# Patient Record
Sex: Male | Born: 1960 | Race: Black or African American | Hispanic: No | Smoking: Current every day smoker
Health system: Southern US, Community
[De-identification: ages and names within clinical notes are randomized; demographics above are authoritative.]

## PROBLEM LIST (undated history)

## (undated) DIAGNOSIS — I429 Cardiomyopathy, unspecified: Secondary | ICD-10-CM

## (undated) DIAGNOSIS — F1010 Alcohol abuse, uncomplicated: Secondary | ICD-10-CM

## (undated) DIAGNOSIS — F419 Anxiety disorder, unspecified: Secondary | ICD-10-CM

## (undated) HISTORY — PX: APPENDECTOMY: SHX54

## (undated) HISTORY — PX: HERNIA REPAIR: SHX51

## (undated) HISTORY — DX: Cardiomyopathy, unspecified: I42.9

---

## 2001-02-03 ENCOUNTER — Emergency Department (HOSPITAL_COMMUNITY): Admission: EM | Admit: 2001-02-03 | Discharge: 2001-02-04 | Payer: Self-pay | Admitting: Emergency Medicine

## 2005-10-15 ENCOUNTER — Emergency Department (HOSPITAL_COMMUNITY): Admission: EM | Admit: 2005-10-15 | Discharge: 2005-10-15 | Payer: Self-pay | Admitting: Emergency Medicine

## 2006-08-21 ENCOUNTER — Emergency Department (HOSPITAL_COMMUNITY): Admission: EM | Admit: 2006-08-21 | Discharge: 2006-08-21 | Payer: Self-pay | Admitting: *Deleted

## 2011-01-07 ENCOUNTER — Emergency Department (HOSPITAL_COMMUNITY)
Admission: EM | Admit: 2011-01-07 | Discharge: 2011-01-07 | Disposition: A | Discharge status: Home / Self Care | Payer: BC Managed Care – PPO | Attending: Emergency Medicine | Admitting: Emergency Medicine

## 2011-01-07 DIAGNOSIS — T7840XA Allergy, unspecified, initial encounter: Secondary | ICD-10-CM | POA: Insufficient documentation

## 2011-01-07 DIAGNOSIS — L299 Pruritus, unspecified: Secondary | ICD-10-CM | POA: Insufficient documentation

## 2011-01-07 DIAGNOSIS — R21 Rash and other nonspecific skin eruption: Secondary | ICD-10-CM | POA: Insufficient documentation

## 2011-01-07 DIAGNOSIS — I10 Essential (primary) hypertension: Secondary | ICD-10-CM | POA: Insufficient documentation

## 2013-05-07 ENCOUNTER — Other Ambulatory Visit (INDEPENDENT_AMBULATORY_CARE_PROVIDER_SITE_OTHER): Payer: BC Managed Care – PPO

## 2013-05-07 ENCOUNTER — Ambulatory Visit (INDEPENDENT_AMBULATORY_CARE_PROVIDER_SITE_OTHER): Payer: BC Managed Care – PPO | Admitting: Internal Medicine

## 2013-05-07 ENCOUNTER — Encounter: Payer: Self-pay | Admitting: Internal Medicine

## 2013-05-07 ENCOUNTER — Ambulatory Visit (INDEPENDENT_AMBULATORY_CARE_PROVIDER_SITE_OTHER)
Admission: RE | Admit: 2013-05-07 | Discharge: 2013-05-07 | Disposition: A | Discharge status: Home / Self Care | Payer: BC Managed Care – PPO | Source: Ambulatory Visit | Attending: Internal Medicine | Admitting: Internal Medicine

## 2013-05-07 ENCOUNTER — Other Ambulatory Visit: Payer: Self-pay | Admitting: Internal Medicine

## 2013-05-07 VITALS — BP 122/82 | HR 93 | Temp 98.4°F | Ht 69.0 in | Wt 194.0 lb

## 2013-05-07 DIAGNOSIS — Z0000 Encounter for general adult medical examination without abnormal findings: Secondary | ICD-10-CM

## 2013-05-07 DIAGNOSIS — Z130 Encounter for screening for diseases of the blood and blood-forming organs and certain disorders involving the immune mechanism: Secondary | ICD-10-CM

## 2013-05-07 DIAGNOSIS — E785 Hyperlipidemia, unspecified: Secondary | ICD-10-CM

## 2013-05-07 DIAGNOSIS — Z1329 Encounter for screening for other suspected endocrine disorder: Secondary | ICD-10-CM

## 2013-05-07 DIAGNOSIS — Z125 Encounter for screening for malignant neoplasm of prostate: Secondary | ICD-10-CM

## 2013-05-07 DIAGNOSIS — M25559 Pain in unspecified hip: Secondary | ICD-10-CM

## 2013-05-07 DIAGNOSIS — M25552 Pain in left hip: Secondary | ICD-10-CM

## 2013-05-07 DIAGNOSIS — Z1211 Encounter for screening for malignant neoplasm of colon: Secondary | ICD-10-CM

## 2013-05-07 DIAGNOSIS — M87052 Idiopathic aseptic necrosis of left femur: Secondary | ICD-10-CM

## 2013-05-07 DIAGNOSIS — Z131 Encounter for screening for diabetes mellitus: Secondary | ICD-10-CM

## 2013-05-07 LAB — HEPATIC FUNCTION PANEL
ALT: 24 U/L (ref 0–53)
Albumin: 4.2 g/dL (ref 3.5–5.2)
Bilirubin, Direct: 0.1 mg/dL (ref 0.0–0.3)
Total Protein: 6.9 g/dL (ref 6.0–8.3)

## 2013-05-07 LAB — BASIC METABOLIC PANEL
CO2: 26 mEq/L (ref 19–32)
Chloride: 102 mEq/L (ref 96–112)
Creatinine, Ser: 0.9 mg/dL (ref 0.4–1.5)
Potassium: 4.2 mEq/L (ref 3.5–5.1)
Sodium: 138 mEq/L (ref 135–145)

## 2013-05-07 LAB — LIPID PANEL
Cholesterol: 187 mg/dL (ref 0–200)
HDL: 36.9 mg/dL — ABNORMAL LOW (ref >39.00)
Total CHOL/HDL Ratio: 5
Triglycerides: 254 mg/dL — ABNORMAL HIGH (ref 0.0–149.0)

## 2013-05-07 LAB — TSH: TSH: 1.06 u[IU]/mL (ref 0.35–5.50)

## 2013-05-07 LAB — CBC
MCHC: 33.4 g/dL (ref 30.0–36.0)
RDW: 12.9 % (ref 11.5–14.6)
WBC: 8.5 10*3/uL (ref 4.5–10.5)

## 2013-05-07 LAB — LDL CHOLESTEROL, DIRECT: Direct LDL: 105.1 mg/dL

## 2013-05-07 MED ORDER — OXYCODONE-ACETAMINOPHEN 10-325 MG PO TABS: 1.0000 | ORAL_TABLET | ORAL | Status: DC | PRN

## 2013-05-07 MED ORDER — TRAMADOL HCL 50 MG PO TABS: 50.0000 mg | ORAL_TABLET | Freq: Three times a day (TID) | ORAL | Status: DC | PRN

## 2013-05-07 MED ORDER — METHYLPREDNISOLONE ACETATE 80 MG/ML IJ SUSP
80.0000 mg | Freq: Once | INTRAMUSCULAR | Status: AC
  Administered 2013-05-07: 80 mg via INTRAMUSCULAR

## 2013-05-07 NOTE — Progress Notes (Signed)
HPI  Pt presents to the clinic today to establish care. He does not have a PCP and has not been seen by a doctor is some time. He does have some concerns today about left hip pain. He does here it click every day. It does cause him pain on a daily basis. He does take 6-7 advil at a time to help the pain ease off. He denies any injury to the area. He denies numbness and tigling down the leg theophylline pain is 7/10. It seems to be worse when he stands.  Flu: never Tetanus : within the last 10 years Colonoscopy: never Prostate Screening: never Eye doctor: never Dentist: never  History reviewed. No pertinent past medical history.  No current outpatient prescriptions on file.   No current facility-administered medications for this visit.    No Known Allergies  Family History  Problem Relation Age of Onset  . Colon cancer Brother   . Liver cancer Brother   . Diabetes Paternal Uncle   . Stroke Neg Hx   . Learning disabilities Neg Hx   . Hypertension Neg Hx   . Heart disease Neg Hx     History   Social History  . Marital Status: Married    Spouse Name: N/A    Number of Children: N/A  . Years of Education: N/A   Occupational History  . Furniture    Social History Main Topics  . Smoking status: Current Every Day Smoker -- 0.25 packs/day for 4 years    Types: Cigarettes  . Smokeless tobacco: Never Used  . Alcohol Use: Yes  . Drug Use: No  . Sexually Active: Yes    Birth Control/ Protection: None   Other Topics Concern  . Not on file   Social History Narrative   Regular exercise-no   Caffeine Use-yes    ROS:  Constitutional: Denies fever, malaise, fatigue, headache or abrupt weight changes.  HEENT: Denies eye pain, eye redness, ear pain, ringing in the ears, wax buildup, runny nose, nasal congestion, bloody nose, or sore throat. Respiratory: Denies difficulty breathing, shortness of breath, cough or sputum production.   Cardiovascular: Denies chest pain, chest  tightness, palpitations or swelling in the hands or feet.  Gastrointestinal: Denies abdominal pain, bloating, constipation, diarrhea or blood in the stool.  GU: Denies frequency, urgency, pain with urination, blood in urine, odor or discharge. Musculoskeletal: Pt reports left hip pain. Denies decrease in range of motion, difficulty with gait, muscle pain or joint swelling.  Skin: Denies redness, rashes, lesions or ulcercations.  Neurological: Denies dizziness, difficulty with memory, difficulty with speech or problems with balance and coordination.   No other specific complaints in a complete review of systems (except as listed in HPI above).  PE:  BP 122/82  Pulse 93  Temp(Src) 98.4 F (36.9 C) (Oral)  Ht 5\' 9"  (1.753 m)  Wt 194 lb (87.998 kg)  BMI 28.64 kg/m2  SpO2 97% Wt Readings from Last 3 Encounters:  05/07/13 194 lb (87.998 kg)    General: Appears his stated age, well developed, well nourished in NAD. HEENT: Head: normal shape and size; Eyes: sclera white, no icterus, conjunctiva pink, PERRLA and EOMs intact; Ears: Tm's gray and intact, normal light reflex; Nose: mucosa pink and moist, septum midline; Throat/Mouth: Teeth present, mucosa pink and moist, no lesions or ulcerations noted.  Neck: Normal range of motion. Neck supple, trachea midline. No massses, lumps or thyromegaly present.  Cardiovascular: Normal rate and rhythm. S1,S2 noted.  No  murmur, rubs or gallops noted. No JVD or BLE edema. No carotid bruits noted. Pulmonary/Chest: Normal effort and positive vesicular breath sounds. No respiratory distress. No wheezes, rales or ronchi noted.  Abdomen: Soft and nontender. Normal bowel sounds, no bruits noted. No distention or masses noted. Liver, spleen and kidneys non palpable. Musculoskeletal: Normal range of motion. No signs of joint swelling. No difficulty with gait. Click noted with passive ROM of left hip. Neurological: Alert and oriented. Cranial nerves II-XII intact.  Coordination normal. +DTRs bilaterally. Psychiatric: Mood and affect normal. Behavior is normal. Judgment and thought content normal.      Assessment and Plan:  Preventative Health Maintenance:  Start to work on diet and exercise Encouraged pt to quit smoking, counseling and cessation materials provided, pt not ready to quit. Will set up colonoscopy Will obtain labs today Will screen for prostate cancer

## 2013-05-07 NOTE — Addendum Note (Signed)
Addended by: Brenton Grills C on: 05/07/2013 11:16 AM   Modules accepted: Orders

## 2013-05-07 NOTE — Patient Instructions (Signed)
Health Maintenance, Males A healthy lifestyle and preventative care can promote health and wellness.  Maintain regular health, dental, and eye exams.  Eat a healthy diet. Foods like vegetables, fruits, whole grains, low-fat dairy products, and lean protein foods contain the nutrients you need without too many calories. Decrease your intake of foods high in solid fats, added sugars, and salt. Get information about a proper diet from your caregiver, if necessary.  Regular physical exercise is one of the most important things you can do for your health. Most adults should get at least 150 minutes of moderate-intensity exercise (any activity that increases your heart rate and causes you to sweat) each week. In addition, most adults need muscle-strengthening exercises on 2 or more days a week.   Maintain a healthy weight. The body mass index (BMI) is a screening tool to identify possible weight problems. It provides an estimate of body fat based on height and weight. Your caregiver can help determine your BMI, and can help you achieve or maintain a healthy weight. For adults 20 years and older:  A BMI below 18.5 is considered underweight.  A BMI of 18.5 to 24.9 is normal.  A BMI of 25 to 29.9 is considered overweight.  A BMI of 30 and above is considered obese.  Maintain normal blood lipids and cholesterol by exercising and minimizing your intake of saturated fat. Eat a balanced diet with plenty of fruits and vegetables. Blood tests for lipids and cholesterol should begin at age 20 and be repeated every 5 years. If your lipid or cholesterol levels are high, you are over 50, or you are a high risk for heart disease, you may need your cholesterol levels checked more frequently.Ongoing high lipid and cholesterol levels should be treated with medicines, if diet and exercise are not effective.  If you smoke, find out from your caregiver how to quit. If you do not use tobacco, do not start.  If you  choose to drink alcohol, do not exceed 2 drinks per day. One drink is considered to be 12 ounces (355 mL) of beer, 5 ounces (148 mL) of wine, or 1.5 ounces (44 mL) of liquor.  Avoid use of street drugs. Do not share needles with anyone. Ask for help if you need support or instructions about stopping the use of drugs.  High blood pressure causes heart disease and increases the risk of stroke. Blood pressure should be checked at least every 1 to 2 years. Ongoing high blood pressure should be treated with medicines if weight loss and exercise are not effective.  If you are 45 to 51 years old, ask your caregiver if you should take aspirin to prevent heart disease.  Diabetes screening involves taking a blood sample to check your fasting blood sugar level. This should be done once every 3 years, after age 45, if you are within normal weight and without risk factors for diabetes. Testing should be considered at a younger age or be carried out more frequently if you are overweight and have at least 1 risk factor for diabetes.  Colorectal cancer can be detected and often prevented. Most routine colorectal cancer screening begins at the age of 50 and continues through age 75. However, your caregiver may recommend screening at an earlier age if you have risk factors for colon cancer. On a yearly basis, your caregiver may provide home test kits to check for hidden blood in the stool. Use of a small camera at the end of a tube,   to directly examine the colon (sigmoidoscopy or colonoscopy), can detect the earliest forms of colorectal cancer. Talk to your caregiver about this at age 50, when routine screening begins. Direct examination of the colon should be repeated every 5 to 10 years through age 75, unless early forms of pre-cancerous polyps or small growths are found.  Hepatitis C blood testing is recommended for all people born from 1945 through 1965 and any individual with known risks for hepatitis C.  Healthy  men should no longer receive prostate-specific antigen (PSA) blood tests as part of routine cancer screening. Consult with your caregiver about prostate cancer screening.  Testicular cancer screening is not recommended for adolescents or adult males who have no symptoms. Screening includes self-exam, caregiver exam, and other screening tests. Consult with your caregiver about any symptoms you have or any concerns you have about testicular cancer.  Practice safe sex. Use condoms and avoid high-risk sexual practices to reduce the spread of sexually transmitted infections (STIs).  Use sunscreen with a sun protection factor (SPF) of 30 or greater. Apply sunscreen liberally and repeatedly throughout the day. You should seek shade when your shadow is shorter than you. Protect yourself by wearing long sleeves, pants, a wide-brimmed hat, and sunglasses year round, whenever you are outdoors.  Notify your caregiver of new moles or changes in moles, especially if there is a change in shape or color. Also notify your caregiver if a mole is larger than the size of a pencil eraser.  A one-time screening for abdominal aortic aneurysm (AAA) and surgical repair of large AAAs by sound wave imaging (ultrasonography) is recommended for ages 65 to 75 years who are current or former smokers.  Stay current with your immunizations. Document Released: 05/09/2008 Document Revised: 02/03/2012 Document Reviewed: 04/08/2011 [employer] Patient Information 2014 [employer].  

## 2013-05-07 NOTE — Assessment & Plan Note (Signed)
Will obtain xray of left hip Will treat with tramadol as patient is taking too much advil May need referral to ortho

## 2013-06-21 ENCOUNTER — Telehealth: Payer: Self-pay | Admitting: *Deleted

## 2013-06-21 DIAGNOSIS — M87052 Idiopathic aseptic necrosis of left femur: Secondary | ICD-10-CM

## 2013-06-21 NOTE — Telephone Encounter (Signed)
Has he been seen by ortho yet? Ortho should have evaluated him and treated his pain by now.

## 2013-06-21 NOTE — Telephone Encounter (Signed)
pts wife called requesting refill on Percocet 10-325mg .  Please advise

## 2013-06-22 MED ORDER — OXYCODONE-ACETAMINOPHEN 10-325 MG PO TABS: 1.0000 | ORAL_TABLET | ORAL | Status: DC | PRN

## 2013-06-22 NOTE — Telephone Encounter (Signed)
Pt's spouse stated that he went to ortho, they stated that he need a hip replacement and will give him injection as well. They also gave him tramadol , but it is not working. Please advise.

## 2013-06-22 NOTE — Telephone Encounter (Signed)
Ok to refill percocet. Has he been scheduled for the hip replacement?

## 2013-06-22 NOTE — Telephone Encounter (Signed)
Generated rx regina signed. Called wife gave regina response. Wife states he has a court date coming up. Will schedule surgery after that is over with...lmb

## 2013-07-16 ENCOUNTER — Telehealth: Payer: Self-pay | Admitting: *Deleted

## 2013-07-16 NOTE — Telephone Encounter (Signed)
Pt's wife called and lvm requesting a rx refill for percocet 10-325 mg. Call back [phone].

## 2013-07-16 NOTE — Telephone Encounter (Signed)
Pt called to check up on med request. Please advise.

## 2013-07-16 NOTE — Telephone Encounter (Signed)
Sorry, no - last rx given 06/23/13 - only one rx per 30days Also, I would recommend pt follow up with ortho for pain medications pending surgical intervention, but will defer that to his PCP Rene Kocher) thanks

## 2013-07-16 NOTE — Telephone Encounter (Signed)
Called pt, can not leave vm, will try again.

## 2013-07-19 NOTE — Telephone Encounter (Signed)
Called pt 5 times Friday and again this morning 07/19/13, no answer.

## 2013-10-07 ENCOUNTER — Encounter (HOSPITAL_COMMUNITY)
Admission: RE | Admit: 2013-10-07 | Discharge: 2013-10-07 | Disposition: A | Discharge status: Home / Self Care | Payer: BC Managed Care – PPO | Source: Ambulatory Visit | Attending: Physician Assistant | Admitting: Physician Assistant

## 2013-10-07 ENCOUNTER — Encounter (HOSPITAL_COMMUNITY)
Admission: RE | Admit: 2013-10-07 | Discharge: 2013-10-07 | Disposition: A | Discharge status: Home / Self Care | Payer: BC Managed Care – PPO | Source: Ambulatory Visit | Attending: Orthopedic Surgery | Admitting: Orthopedic Surgery

## 2013-10-07 ENCOUNTER — Encounter (HOSPITAL_COMMUNITY): Payer: Self-pay

## 2013-10-07 ENCOUNTER — Other Ambulatory Visit: Payer: Self-pay | Admitting: Physician Assistant

## 2013-10-07 DIAGNOSIS — Z01818 Encounter for other preprocedural examination: Secondary | ICD-10-CM | POA: Insufficient documentation

## 2013-10-07 DIAGNOSIS — Z01812 Encounter for preprocedural laboratory examination: Secondary | ICD-10-CM | POA: Insufficient documentation

## 2013-10-07 DIAGNOSIS — Z01810 Encounter for preprocedural cardiovascular examination: Secondary | ICD-10-CM | POA: Insufficient documentation

## 2013-10-07 LAB — COMPREHENSIVE METABOLIC PANEL
AST: 27 U/L (ref 0–37)
Albumin: 3.9 g/dL (ref 3.5–5.2)
Calcium: 9.4 mg/dL (ref 8.4–10.5)
Creatinine, Ser: 0.71 mg/dL (ref 0.50–1.35)
GFR calc non Af Amer: >90 mL/min (ref >90)
Total Protein: 7.4 g/dL (ref 6.0–8.3)

## 2013-10-07 LAB — CBC WITH DIFFERENTIAL/PLATELET
Basophils Absolute: 0 10*3/uL (ref 0.0–0.1)
Basophils Relative: 0 % (ref 0–1)
Eosinophils Absolute: 0.1 10*3/uL (ref 0.0–0.7)
Eosinophils Relative: 2 % (ref 0–5)
HCT: 40.5 % (ref 39.0–52.0)
Lymphs Abs: 1.6 10*3/uL (ref 0.7–4.0)
MCHC: 34.1 g/dL (ref 30.0–36.0)
MCV: 89.4 fL (ref 78.0–100.0)
Monocytes Absolute: 1 10*3/uL (ref 0.1–1.0)
Platelets: 201 10*3/uL (ref 150–400)
RBC: 4.53 MIL/uL (ref 4.22–5.81)
RDW: 13.7 % (ref 11.5–15.5)
WBC: 7.3 10*3/uL (ref 4.0–10.5)

## 2013-10-07 LAB — URINE MICROSCOPIC-ADD ON

## 2013-10-07 LAB — URINALYSIS, ROUTINE W REFLEX MICROSCOPIC
Glucose, UA: NEGATIVE mg/dL
Hgb urine dipstick: NEGATIVE
Ketones, ur: NEGATIVE mg/dL
Nitrite: NEGATIVE
Protein, ur: NEGATIVE mg/dL

## 2013-10-07 LAB — TYPE AND SCREEN
ABO/RH(D): O POS
Antibody Screen: NEGATIVE

## 2013-10-07 LAB — ABO/RH: ABO/RH(D): O POS

## 2013-10-07 LAB — SURGICAL PCR SCREEN: Staphylococcus aureus: NEGATIVE

## 2013-10-07 LAB — PROTIME-INR: INR: 1.04 (ref 0.00–1.49)

## 2013-10-07 MED ORDER — CHLORHEXIDINE GLUCONATE 4 % EX LIQD: 60.0000 mL | Freq: Once | CUTANEOUS | Status: DC

## 2013-10-07 NOTE — Pre-Procedure Instructions (Signed)
[name]  10/07/2013   Your procedure is scheduled on:  October 15, 2013  Report to [facility] (Entrance A) at 11:30 AM.  Call this number if you have problems the morning of surgery: [phone]   Remember:   Do not eat food or drink liquids after midnight.   Take these medicines the morning of surgery with A SIP OF WATER: pain pill as needed   Do not wear jewelry, make-up or nail polish.  Do not wear lotions, powders, or perfumes. You may wear deodorant.  Do not shave 48 hours prior to surgery. Men may shave face and neck.  Do not bring valuables to the hospital.  [facility] is not responsible                  for any belongings or valuables.               Contacts, dentures or bridgework may not be worn into surgery.  Leave suitcase in the car. After surgery it may be brought to your room.  For patients admitted to the hospital, discharge time is determined by your                treatment team.                Special Instructions: Shower using CHG 2 nights before surgery and the night before surgery.  If you shower the day of surgery use CHG.  Use special wash - you have one bottle of CHG for all showers.  You should use approximately 1/3 of the bottle for each shower.   Please read over the following fact sheets that you were given: Pain Booklet, Coughing and Deep Breathing, Blood Transfusion Information and Surgical Site Infection Prevention

## 2013-10-08 LAB — URINE CULTURE

## 2013-10-14 MED ORDER — CEFAZOLIN SODIUM-DEXTROSE 2-3 GM-% IV SOLR
2.0000 g | INTRAVENOUS | Status: AC
  Administered 2013-10-15: 2 g via INTRAVENOUS

## 2013-10-14 NOTE — H&P (Signed)
TOTAL HIP ADMISSION H&P  Patient is admitted for left total hip arthroplasty.  Subjective:  Chief Complaint: left hip pain  HPI: [name], 52 y.o. 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  Onset of symptoms was gradual starting 1 years ago with gradually worsening course since that time.The patient noted no past surgery on the left hip(s).  Patient currently rates pain in the left hip at 10 out of 10 with activity. Patient has night pain, worsening of pain with activity and weight bearing, trendelenberg gait, pain that interfers with activities of daily living and pain with passive range of motion. Patient has evidence of joint space narrowing and AVN with collapse by imaging studies. This condition presents safety issues increasing the risk of falls. There is no current active infection.  Patient Active Problem List   Diagnosis Date Noted  . Left hip pain 05/07/2013   No past medical history on file.  Past Surgical History  Procedure Laterality Date  . Appendectomy    . Hernia repair       (Not in a hospital admission) No Known Allergies  History  Substance Use Topics  . Smoking status: Current Every Day Smoker -- 0.25 packs/day for 4 years    Types: Cigarettes  . Smokeless tobacco: Never Used  . Alcohol Use: Yes    Family History  Problem Relation Age of Onset  . Colon cancer Brother   . Liver cancer Brother   . Diabetes Paternal Uncle   . Stroke Neg Hx   . Learning disabilities Neg Hx   . Hypertension Neg Hx   . Heart disease Neg Hx      Review of Systems  Constitutional: Negative.   HENT: Negative.   Eyes: Negative.   Respiratory: Negative.   Cardiovascular: Negative.   Gastrointestinal: Negative.   Genitourinary: Negative.    Musculoskeletal: Positive for joint pain. Negative for back pain, falls and neck pain.  Skin: Negative.   Neurological: Negative.   Endo/Heme/Allergies: Negative.   Psychiatric/Behavioral: Negative.     Objective:  Physical Exam  Constitutional: He is oriented to person, place, and time. He appears well-developed and well-nourished. No distress.  HENT:  Head: Normocephalic and atraumatic.  Nose: Nose normal.  Eyes: Conjunctivae and EOM are normal. Pupils are equal, round, and reactive to light.  Neck: Normal range of motion. Neck supple.  Cardiovascular: Normal rate, regular rhythm, normal heart sounds and intact distal pulses.   No murmur heard. Respiratory: Effort normal and breath sounds normal. No respiratory distress. He has no wheezes. He has no rales. He exhibits no tenderness.  GI: Soft. Bowel sounds are normal. He exhibits no distension. There is no tenderness.  Musculoskeletal:       Left hip: He exhibits decreased range of motion, decreased strength, tenderness and bony tenderness.  Lymphadenopathy:    He has no cervical adenopathy.  Neurological: He is alert and oriented to person, place, and time. No cranial nerve deficit.  Skin: Skin is warm and dry. No rash noted. No erythema.  Psychiatric: He has a normal mood and affect. His behavior is normal.    Vital signs in last 24 hours: @VSRANGES @  Labs:   Estimated body mass index is 28.27 kg/(m^2) as calculated from the following:   Height as of 05/07/13: 5'  9" (1.753 m).   Weight as of an earlier encounter on 10/07/13: 86.864 kg (191 lb 8 oz).   Imaging Review Plain radiographs demonstrate moderate degenerative joint disease of the left hip(s). The bone quality appears to be good for age and reported activity level.  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  Assessment/Plan:  End stage arthritis, left hip(s)  The patient history, physical examination, clinical judgement of the provider and imaging studies  are consistent with end stage degenerative joint disease of the left hip(s) and total hip arthroplasty is deemed medically necessary. The treatment options including medical management, injection therapy, arthroscopy and arthroplasty were discussed at length. The risks and benefits of total hip arthroplasty were presented and reviewed. The risks due to aseptic loosening, infection, stiffness, dislocation/subluxation,  thromboembolic complications and other imponderables were discussed.  The patient acknowledged the explanation, agreed to proceed with the plan and consent was signed. Patient is being admitted for inpatient treatment for surgery, pain control, PT, OT, prophylactic antibiotics, VTE prophylaxis, progressive ambulation and ADL's and discharge planning.The patient is planning to be discharged home with home health services

## 2013-10-15 ENCOUNTER — Inpatient Hospital Stay (HOSPITAL_COMMUNITY): Payer: BC Managed Care – PPO

## 2013-10-15 ENCOUNTER — Encounter (HOSPITAL_COMMUNITY): Payer: Self-pay | Admitting: *Deleted

## 2013-10-15 ENCOUNTER — Ambulatory Visit (HOSPITAL_COMMUNITY): Payer: BC Managed Care – PPO | Admitting: Anesthesiology

## 2013-10-15 ENCOUNTER — Encounter (HOSPITAL_COMMUNITY): Payer: BC Managed Care – PPO | Admitting: Anesthesiology

## 2013-10-15 ENCOUNTER — Inpatient Hospital Stay (HOSPITAL_COMMUNITY)
Admission: RE | Admit: 2013-10-15 | Discharge: 2013-10-17 | DRG: 470 | Disposition: A | Discharge status: Home Health | Payer: BC Managed Care – PPO | Source: Ambulatory Visit | Attending: Orthopedic Surgery | Admitting: Orthopedic Surgery

## 2013-10-15 ENCOUNTER — Encounter (HOSPITAL_COMMUNITY)
Admission: RE | Disposition: A | Discharge status: Home Health | Payer: Self-pay | Source: Ambulatory Visit | Attending: Orthopedic Surgery

## 2013-10-15 DIAGNOSIS — D62 Acute posthemorrhagic anemia: Secondary | ICD-10-CM | POA: Diagnosis not present

## 2013-10-15 DIAGNOSIS — M87059 Idiopathic aseptic necrosis of unspecified femur: Principal | ICD-10-CM | POA: Diagnosis present

## 2013-10-15 DIAGNOSIS — Z23 Encounter for immunization: Secondary | ICD-10-CM

## 2013-10-15 HISTORY — PX: TOTAL HIP ARTHROPLASTY: SHX124

## 2013-10-15 LAB — CREATININE, SERUM
Creatinine, Ser: 0.69 mg/dL (ref 0.50–1.35)
GFR calc Af Amer: >90 mL/min (ref >90)

## 2013-10-15 LAB — CBC
MCHC: 33.6 g/dL (ref 30.0–36.0)
Platelets: 217 10*3/uL (ref 150–400)
RBC: 3.97 MIL/uL — ABNORMAL LOW (ref 4.22–5.81)
WBC: 9.2 10*3/uL (ref 4.0–10.5)

## 2013-10-15 SURGERY — ARTHROPLASTY, HIP, TOTAL,POSTERIOR APPROACH
Anesthesia: General | Site: Hip | Laterality: Left | Wound class: Clean

## 2013-10-15 MED ORDER — PROMETHAZINE HCL 25 MG/ML IJ SOLN: 6.2500 mg | INTRAMUSCULAR | Status: DC | PRN

## 2013-10-15 MED ORDER — OXYCODONE HCL 5 MG/5ML PO SOLN: 5.0000 mg | Freq: Once | ORAL | Status: DC | PRN

## 2013-10-15 MED ORDER — SODIUM CHLORIDE 0.9 % IV SOLN: INTRAVENOUS | Status: DC

## 2013-10-15 MED ORDER — ONDANSETRON HCL 4 MG PO TABS: 4.0000 mg | ORAL_TABLET | Freq: Four times a day (QID) | ORAL | Status: DC | PRN

## 2013-10-15 MED ORDER — ACETAMINOPHEN 650 MG RE SUPP: 650.0000 mg | Freq: Four times a day (QID) | RECTAL | Status: DC | PRN

## 2013-10-15 MED ORDER — ONDANSETRON HCL 4 MG/2ML IJ SOLN
INTRAMUSCULAR | Status: DC | PRN
  Administered 2013-10-15: 4 mg via INTRAVENOUS

## 2013-10-15 MED ORDER — TRANEXAMIC ACID 100 MG/ML IV SOLN
1000.0000 mg | INTRAVENOUS | Status: AC
  Administered 2013-10-15: 1000 mg via INTRAVENOUS
  Filled 2013-10-15: qty 10

## 2013-10-15 MED ORDER — METHOCARBAMOL 100 MG/ML IJ SOLN
500.0000 mg | Freq: Four times a day (QID) | INTRAMUSCULAR | Status: DC | PRN
  Filled 2013-10-15: qty 5

## 2013-10-15 MED ORDER — FLEET ENEMA 7-19 GM/118ML RE ENEM: 1.0000 | ENEMA | Freq: Once | RECTAL | Status: AC | PRN

## 2013-10-15 MED ORDER — SODIUM CHLORIDE 0.9 % IR SOLN
Status: DC | PRN
  Administered 2013-10-15: 1000 mL

## 2013-10-15 MED ORDER — LACTATED RINGERS IV SOLN
INTRAVENOUS | Status: DC
  Administered 2013-10-15: 12:00:00 via INTRAVENOUS

## 2013-10-15 MED ORDER — METOCLOPRAMIDE HCL 5 MG/ML IJ SOLN: 5.0000 mg | Freq: Three times a day (TID) | INTRAMUSCULAR | Status: DC | PRN

## 2013-10-15 MED ORDER — ENOXAPARIN SODIUM 30 MG/0.3ML ~~LOC~~ SOLN: 30.0000 mg | Freq: Two times a day (BID) | SUBCUTANEOUS | Status: DC

## 2013-10-15 MED ORDER — SENNOSIDES-DOCUSATE SODIUM 8.6-50 MG PO TABS: 1.0000 | ORAL_TABLET | Freq: Every evening | ORAL | Status: DC | PRN

## 2013-10-15 MED ORDER — OXYCODONE HCL 5 MG PO TABS: 5.0000 mg | ORAL_TABLET | Freq: Once | ORAL | Status: DC | PRN

## 2013-10-15 MED ORDER — HYDROMORPHONE HCL PF 1 MG/ML IJ SOLN
INTRAMUSCULAR | Status: AC
  Administered 2013-10-15: 0.5 mg via INTRAVENOUS
  Filled 2013-10-15: qty 1

## 2013-10-15 MED ORDER — BISACODYL 10 MG RE SUPP: 10.0000 mg | Freq: Every day | RECTAL | Status: DC | PRN

## 2013-10-15 MED ORDER — ENOXAPARIN SODIUM 30 MG/0.3ML ~~LOC~~ SOLN
30.0000 mg | Freq: Two times a day (BID) | SUBCUTANEOUS | Status: DC
  Administered 2013-10-16 – 2013-10-17 (×3): 30 mg via SUBCUTANEOUS
  Filled 2013-10-15 (×6): qty 0.3

## 2013-10-15 MED ORDER — OXYCODONE HCL 5 MG PO TABS
5.0000 mg | ORAL_TABLET | ORAL | Status: DC | PRN
  Administered 2013-10-15 – 2013-10-17 (×11): 10 mg via ORAL
  Filled 2013-10-15 (×11): qty 2

## 2013-10-15 MED ORDER — DEXAMETHASONE SODIUM PHOSPHATE 10 MG/ML IJ SOLN
10.0000 mg | Freq: Three times a day (TID) | INTRAMUSCULAR | Status: AC
  Administered 2013-10-15 – 2013-10-16 (×2): 10 mg via INTRAVENOUS
  Filled 2013-10-15 (×3): qty 1

## 2013-10-15 MED ORDER — CEFAZOLIN SODIUM-DEXTROSE 2-3 GM-% IV SOLR
INTRAVENOUS | Status: AC
  Filled 2013-10-15: qty 50

## 2013-10-15 MED ORDER — ACETAMINOPHEN 325 MG PO TABS
650.0000 mg | ORAL_TABLET | Freq: Four times a day (QID) | ORAL | Status: DC | PRN
  Filled 2013-10-15: qty 2

## 2013-10-15 MED ORDER — METOCLOPRAMIDE HCL 10 MG PO TABS: 5.0000 mg | ORAL_TABLET | Freq: Three times a day (TID) | ORAL | Status: DC | PRN

## 2013-10-15 MED ORDER — FENTANYL CITRATE 0.05 MG/ML IJ SOLN
INTRAMUSCULAR | Status: DC | PRN
  Administered 2013-10-15: 100 ug via INTRAVENOUS
  Administered 2013-10-15 (×3): 50 ug via INTRAVENOUS
  Administered 2013-10-15: 100 ug via INTRAVENOUS
  Administered 2013-10-15 (×3): 50 ug via INTRAVENOUS

## 2013-10-15 MED ORDER — HYDROMORPHONE HCL PF 1 MG/ML IJ SOLN
1.0000 mg | INTRAMUSCULAR | Status: DC | PRN
  Administered 2013-10-15: 1 mg via INTRAVENOUS
  Filled 2013-10-15: qty 1

## 2013-10-15 MED ORDER — PHENOL 1.4 % MT LIQD: 1.0000 | OROMUCOSAL | Status: DC | PRN

## 2013-10-15 MED ORDER — HYDROMORPHONE HCL PF 1 MG/ML IJ SOLN
0.2500 mg | INTRAMUSCULAR | Status: DC | PRN
  Administered 2013-10-15 (×4): 0.5 mg via INTRAVENOUS

## 2013-10-15 MED ORDER — GLYCOPYRROLATE 0.2 MG/ML IJ SOLN
INTRAMUSCULAR | Status: DC | PRN
  Administered 2013-10-15: 0.6 mg via INTRAVENOUS

## 2013-10-15 MED ORDER — METHOCARBAMOL 500 MG PO TABS: 500.0000 mg | ORAL_TABLET | Freq: Four times a day (QID) | ORAL | Status: DC

## 2013-10-15 MED ORDER — CEFAZOLIN SODIUM 1-5 GM-% IV SOLN
1.0000 g | Freq: Three times a day (TID) | INTRAVENOUS | Status: AC
  Administered 2013-10-15 – 2013-10-16 (×2): 1 g via INTRAVENOUS
  Filled 2013-10-15 (×2): qty 50

## 2013-10-15 MED ORDER — PHENYLEPHRINE HCL 10 MG/ML IJ SOLN
INTRAMUSCULAR | Status: DC | PRN
  Administered 2013-10-15 (×2): 120 ug via INTRAVENOUS

## 2013-10-15 MED ORDER — MENTHOL 3 MG MT LOZG: 1.0000 | LOZENGE | OROMUCOSAL | Status: DC | PRN

## 2013-10-15 MED ORDER — DEXAMETHASONE 6 MG PO TABS
10.0000 mg | ORAL_TABLET | Freq: Three times a day (TID) | ORAL | Status: AC
  Administered 2013-10-16: 10 mg via ORAL
  Filled 2013-10-15 (×3): qty 1

## 2013-10-15 MED ORDER — MIDAZOLAM HCL 5 MG/5ML IJ SOLN
INTRAMUSCULAR | Status: DC | PRN
  Administered 2013-10-15: 2 mg via INTRAVENOUS

## 2013-10-15 MED ORDER — PROPOFOL 10 MG/ML IV BOLUS
INTRAVENOUS | Status: DC | PRN
  Administered 2013-10-15: 200 mg via INTRAVENOUS

## 2013-10-15 MED ORDER — DIPHENHYDRAMINE HCL 12.5 MG/5ML PO ELIX: 12.5000 mg | ORAL_SOLUTION | ORAL | Status: DC | PRN

## 2013-10-15 MED ORDER — NEOSTIGMINE METHYLSULFATE 1 MG/ML IJ SOLN
INTRAMUSCULAR | Status: DC | PRN
  Administered 2013-10-15: 3 mg via INTRAVENOUS

## 2013-10-15 MED ORDER — OXYCODONE-ACETAMINOPHEN 5-325 MG PO TABS: ORAL_TABLET | ORAL | Status: DC

## 2013-10-15 MED ORDER — LIDOCAINE HCL (CARDIAC) 20 MG/ML IV SOLN
INTRAVENOUS | Status: DC | PRN
  Administered 2013-10-15: 100 mg via INTRAVENOUS

## 2013-10-15 MED ORDER — LACTATED RINGERS IV SOLN
INTRAVENOUS | Status: DC | PRN
  Administered 2013-10-15 (×3): via INTRAVENOUS

## 2013-10-15 MED ORDER — DOCUSATE SODIUM 100 MG PO CAPS
100.0000 mg | ORAL_CAPSULE | Freq: Two times a day (BID) | ORAL | Status: DC
  Administered 2013-10-15 – 2013-10-17 (×4): 100 mg via ORAL
  Filled 2013-10-15 (×6): qty 1

## 2013-10-15 MED ORDER — ROCURONIUM BROMIDE 100 MG/10ML IV SOLN
INTRAVENOUS | Status: DC | PRN
  Administered 2013-10-15: 30 mg via INTRAVENOUS
  Administered 2013-10-15: 50 mg via INTRAVENOUS
  Administered 2013-10-15: 20 mg via INTRAVENOUS

## 2013-10-15 MED ORDER — METHOCARBAMOL 500 MG PO TABS
500.0000 mg | ORAL_TABLET | Freq: Four times a day (QID) | ORAL | Status: DC | PRN
  Administered 2013-10-15 – 2013-10-17 (×7): 500 mg via ORAL
  Filled 2013-10-15 (×7): qty 1

## 2013-10-15 MED ORDER — ONDANSETRON HCL 4 MG/2ML IJ SOLN: 4.0000 mg | Freq: Four times a day (QID) | INTRAMUSCULAR | Status: DC | PRN

## 2013-10-15 MED ORDER — SODIUM CHLORIDE 0.9 % IV SOLN
INTRAVENOUS | Status: DC
  Administered 2013-10-16: 08:00:00 via INTRAVENOUS

## 2013-10-15 SURGICAL SUPPLY — 66 items
BLADE SAW SAG 73X25 THK (BLADE) ×1
BLADE SAW SGTL 73X25 THK (BLADE) ×1 IMPLANT
BRUSH FEMORAL CANAL (MISCELLANEOUS) IMPLANT
CAPT HIP PF COP ×1 IMPLANT
CLOTH BEACON ORANGE TIMEOUT ST (SAFETY) ×2 IMPLANT
COVER BACK TABLE 24X17X13 BIG (DRAPES) IMPLANT
COVER SURGICAL LIGHT HANDLE (MISCELLANEOUS) ×2 IMPLANT
DRAPE INCISE IOBAN 66X45 STRL (DRAPES) IMPLANT
DRAPE ORTHO SPLIT 77X108 STRL (DRAPES) ×4
DRAPE SURG ORHT 6 SPLT 77X108 (DRAPES) ×2 IMPLANT
DRAPE U-SHAPE 47X51 STRL (DRAPES) ×2 IMPLANT
DRSG ADAPTIC 3X8 NADH LF (GAUZE/BANDAGES/DRESSINGS) ×2 IMPLANT
DRSG PAD ABDOMINAL 8X10 ST (GAUZE/BANDAGES/DRESSINGS) ×3 IMPLANT
DURAPREP 26ML APPLICATOR (WOUND CARE) ×2 IMPLANT
ELECT CAUTERY BLADE 6.4 (BLADE) ×2 IMPLANT
ELECT REM PT RETURN 9FT ADLT (ELECTROSURGICAL) ×2
ELECTRODE REM PT RTRN 9FT ADLT (ELECTROSURGICAL) ×1 IMPLANT
EVACUATOR 1/8 PVC DRAIN (DRAIN) IMPLANT
FACESHIELD LNG OPTICON STERILE (SAFETY) ×4 IMPLANT
GLOVE BIO SURGEON STRL SZ 6.5 (GLOVE) ×1 IMPLANT
GLOVE BIOGEL PI IND STRL 6.5 (GLOVE) IMPLANT
GLOVE BIOGEL PI IND STRL 8 (GLOVE) ×2 IMPLANT
GLOVE BIOGEL PI INDICATOR 6.5 (GLOVE) ×1
GLOVE BIOGEL PI INDICATOR 8 (GLOVE) ×2
GLOVE ECLIPSE 6.5 STRL STRAW (GLOVE) ×1 IMPLANT
GLOVE ORTHO TXT STRL SZ7.5 (GLOVE) ×4 IMPLANT
GLOVE SURG ORTHO 8.0 STRL STRW (GLOVE) ×4 IMPLANT
GOWN PREVENTION PLUS XLARGE (GOWN DISPOSABLE) ×4 IMPLANT
GOWN PREVENTION PLUS XXLARGE (GOWN DISPOSABLE) ×2 IMPLANT
GOWN STRL NON-REIN LRG LVL3 (GOWN DISPOSABLE) ×3 IMPLANT
HANDPIECE INTERPULSE COAX TIP (DISPOSABLE)
HOOD PEEL AWAY FACE SHEILD DIS (HOOD) ×2 IMPLANT
IMMOBILIZER KNEE 20 (SOFTGOODS)
IMMOBILIZER KNEE 20 THIGH 36 (SOFTGOODS) IMPLANT
IMMOBILIZER KNEE 22 UNIV (SOFTGOODS) ×1 IMPLANT
IMMOBILIZER KNEE 24 THIGH 36 (MISCELLANEOUS) IMPLANT
IMMOBILIZER KNEE 24 UNIV (MISCELLANEOUS)
KIT BASIN OR (CUSTOM PROCEDURE TRAY) ×2 IMPLANT
KIT ROOM TURNOVER OR (KITS) ×2 IMPLANT
LINER MARATHON 10D 36X54 (Hips) IMPLANT
LINER MARATHON 10DEG 36X54 (Hips) ×2 IMPLANT
MANIFOLD NEPTUNE II (INSTRUMENTS) ×2 IMPLANT
NDL MAYO TROCAR (NEEDLE) ×1 IMPLANT
NEEDLE 22X1 1/2 (OR ONLY) (NEEDLE) ×2 IMPLANT
NEEDLE MAYO TROCAR (NEEDLE) ×2 IMPLANT
NS IRRIG 1000ML POUR BTL (IV SOLUTION) ×2 IMPLANT
PACK TOTAL JOINT (CUSTOM PROCEDURE TRAY) ×2 IMPLANT
PAD ARMBOARD 7.5X6 YLW CONV (MISCELLANEOUS) ×4 IMPLANT
PRESSURIZER FEMORAL UNIV (MISCELLANEOUS) IMPLANT
SET HNDPC FAN SPRY TIP SCT (DISPOSABLE) IMPLANT
SPONGE GAUZE 4X4 12PLY (GAUZE/BANDAGES/DRESSINGS) ×2 IMPLANT
STAPLER VISISTAT 35W (STAPLE) ×2 IMPLANT
SUCTION FRAZIER TIP 10 FR DISP (SUCTIONS) ×2 IMPLANT
SUT ETHIBOND NAB CT1 #1 30IN (SUTURE) ×8 IMPLANT
SUT VIC AB 0 CT1 27 (SUTURE) ×2
SUT VIC AB 0 CT1 27XBRD ANBCTR (SUTURE) IMPLANT
SUT VIC AB 1 CTB1 27 (SUTURE) ×4 IMPLANT
SUT VIC AB 2-0 CT1 27 (SUTURE) ×4
SUT VIC AB 2-0 CT1 TAPERPNT 27 (SUTURE) ×2 IMPLANT
SYR CONTROL 10ML LL (SYRINGE) ×2 IMPLANT
TAPE CLOTH SURG 6X10 WHT LF (GAUZE/BANDAGES/DRESSINGS) ×1 IMPLANT
TOWEL OR 17X24 6PK STRL BLUE (TOWEL DISPOSABLE) ×2 IMPLANT
TOWEL OR 17X26 10 PK STRL BLUE (TOWEL DISPOSABLE) ×2 IMPLANT
TOWER CARTRIDGE SMART MIX (DISPOSABLE) IMPLANT
TRAY FOLEY CATH 16FRSI W/METER (SET/KITS/TRAYS/PACK) ×2 IMPLANT
WATER STERILE IRR 1000ML POUR (IV SOLUTION) ×8 IMPLANT

## 2013-10-15 NOTE — Anesthesia Procedure Notes (Signed)
Procedure Name: Intubation Date/Time: 10/15/2013 1:46 PM Performed by: Whitman Hero Pre-anesthesia Checklist: Patient identified, Emergency Drugs available, Suction available and Patient being monitored Patient Re-evaluated:Patient Re-evaluated prior to inductionOxygen Delivery Method: Circle system utilized Preoxygenation: Pre-oxygenation with 100% oxygen Intubation Type: IV induction Ventilation: Mask ventilation without difficulty Laryngoscope Size: Mac and 3 Grade View: Grade I Tube type: Oral Number of attempts: 1 Airway Equipment and Method: Patient positioned with wedge pillow Placement Confirmation: positive ETCO2,  ETT inserted through vocal cords under direct vision and breath sounds checked- equal and bilateral Secured at: 22 cm Tube secured with: Tape Dental Injury: Teeth and Oropharynx as per pre-operative assessment

## 2013-10-15 NOTE — Brief Op Note (Signed)
10/15/2013  4:20 PM  PATIENT:  [name]  52 y.o. male  PRE-OPERATIVE DIAGNOSIS:  OSTEOARTHRITIS LEFT HIP  POST-OPERATIVE DIAGNOSIS:  OSTEOARTHRITIS LEFT HIP  PROCEDURE:  Procedure(s): LEFT TOTAL HIP ARTHROPLASTY (Left)  SURGEON:  Surgeon(s) and Role:    * W D Carloyn Manner., MD - Primary  PHYSICIAN ASSISTANT: Margart Sickles, PA-C  ASSISTANTS:   ANESTHESIA:   general  EBL:  Total I/O In: 2000 [I.V.:2000] Out: 1000 [Urine:600; Blood:400]  BLOOD ADMINISTERED:none  DRAINS: none   LOCAL MEDICATIONS USED:  NONE  SPECIMEN:  No Specimen  DISPOSITION OF SPECIMEN:  N/A  COUNTS:  YES  TOURNIQUET:  * No tourniquets in log *  DICTATION: .Other Dictation: Dictation Number Unknown  PLAN OF CARE: Admit to inpatient   PATIENT DISPOSITION:  PACU - hemodynamically stable.   Delay start of Pharmacological VTE agent (>24hrs) due to surgical blood loss or risk of bleeding: yes

## 2013-10-15 NOTE — Transfer of Care (Signed)
Immediate Anesthesia Transfer of Care Note  Patient: [name]  Procedure(s) Performed: Procedure(s): LEFT TOTAL HIP ARTHROPLASTY (Left)  Patient Location: PACU  Anesthesia Type:General  Level of Consciousness: sedated and patient cooperative  Airway & Oxygen Therapy: Patient Spontanous Breathing and Patient connected to nasal cannula oxygen  Post-op Assessment: Report given to PACU RN, Post -op Vital signs reviewed and stable and Patient moving all extremities  Post vital signs: Reviewed and stable  Complications: No apparent anesthesia complications

## 2013-10-15 NOTE — Anesthesia Preprocedure Evaluation (Signed)
Anesthesia Evaluation  Patient identified by MRN, date of birth, ID band Patient awake    Reviewed: Allergy & Precautions, H&P , NPO status , Patient's Chart, lab work & pertinent test results  History of Anesthesia Complications Negative for: history of anesthetic complications  Airway Mallampati: II  Neck ROM: Full    Dental  (+) Teeth Intact   Pulmonary Current Smoker,  breath sounds clear to auscultation        Cardiovascular negative cardio ROS  Rhythm:Regular Rate:Normal     Neuro/Psych    GI/Hepatic negative GI ROS, Neg liver ROS,   Endo/Other    Renal/GU negative Renal ROS     Musculoskeletal  (+) Arthritis -,   Abdominal   Peds  Hematology negative hematology ROS (+)   Anesthesia Other Findings   Reproductive/Obstetrics                           Anesthesia Physical Anesthesia Plan  ASA: I  Anesthesia Plan: General   Post-op Pain Management:    Induction:   Airway Management Planned: Oral ETT  Additional Equipment:   Intra-op Plan:   Post-operative Plan: Extubation in OR  Informed Consent: I have reviewed the patients History and Physical, chart, labs and discussed the procedure including the risks, benefits and alternatives for the proposed anesthesia with the patient or authorized representative who has indicated his/her understanding and acceptance.   Dental advisory given  Plan Discussed with: CRNA and Surgeon  Anesthesia Plan Comments:         Anesthesia Quick Evaluation

## 2013-10-15 NOTE — Progress Notes (Signed)
MEDICATION RELATED NOTE - INITIAL  Tranexamic Acid Indication: Orthopedic Surgery  No Known Allergies  Medical History: History reviewed. No pertinent past medical history.  Assessment: Questions reviewed over the phone with CRNA. All answers were No. I reviewed patient's chart and found the same results.   Plan:  Order filled and sent.   Fayne Norrie 10/15/2013,2:07 PM

## 2013-10-16 LAB — CBC
Hemoglobin: 11.8 g/dL — ABNORMAL LOW (ref 13.0–17.0)
MCH: 30.6 pg (ref 26.0–34.0)
Platelets: 228 10*3/uL (ref 150–400)
RDW: 13.9 % (ref 11.5–15.5)
WBC: 8.8 10*3/uL (ref 4.0–10.5)

## 2013-10-16 LAB — BASIC METABOLIC PANEL
CO2: 24 mEq/L (ref 19–32)
Chloride: 96 mEq/L (ref 96–112)
GFR calc Af Amer: >90 mL/min (ref >90)
Glucose, Bld: 159 mg/dL — ABNORMAL HIGH (ref 70–99)
Potassium: 4.1 mEq/L (ref 3.5–5.1)

## 2013-10-16 MED ORDER — INFLUENZA VAC SPLIT QUAD 0.5 ML IM SUSP
0.5000 mL | INTRAMUSCULAR | Status: AC
  Administered 2013-10-17: 0.5 mL via INTRAMUSCULAR
  Filled 2013-10-16: qty 0.5

## 2013-10-16 NOTE — Op Note (Signed)
NAME:  [name], STROHMEIER NO.:  [id number]  MEDICAL RECORD NO.:  [id number]  LOCATION:  5N06C                        FACILITY:  [facility]  PHYSICIAN:  Dyke Brackett, M.D.    DATE OF BIRTH:  01-Aug-1961  DATE OF PROCEDURE: DATE OF DISCHARGE:                              OPERATIVE REPORT   INDICATIONS:  Severe left hip avascular necrosis thought to be amenable to hospitalization for hip replacement.  PREOPERATIVE DIAGNOSIS:  Left hip avascular necrosis with collapse.  POSTOPERATIVE DIAGNOSIS:  Left hip avascular necrosis with collapse.  OPERATION:  Left total hip replacement (DePuy 15-mm stem AML porous coated, +5 mm neck length, 36 mm hip ball with +5 mm neck length) ceramic, 54-mm sector cup with 2 screws with marathon poly 10-degree lip liner, +4 mm.  SURGEON:  Dyke Brackett, M.D.  ASSISTANT:  Margart Sickles, PA-C  ANESTHESIA:  General.  BLOOD LOSS:  Approximately 400.  DESCRIPTION OF PROCEDURE:  Lateral positioning, posterior approach to the hip made with splitting the iliotibial band, gluteus maximus.  We cut the short external rotators, did a T-capsulotomy of the hip, and then dislocated the hip.  Cut the femoral head about 1 fingerbreadth above the lesser trochanter.  Significant collapse from the AVM was noted.  Then progressively reamed and broached to the above-mentioned stem with a 0.5 mm under reaming.  Attention was next directed to the acetabulum.  Wing retractor was placed superiorly and posteriorly.  Acetabulum was reamed approximately 15 degrees anteversion, 50 degrees abduction back down to bleeding bone. We placed a __________ 54 cup which did not bottom out, then placed the real cup in, sector cup, elected initially to fix it with no screws.  We then trialed with the trial poly in and all parameters were acceptable relative to the positioning with a +1.5 mm neck length.  We then elected to put the final poly in followed by the final  femoral prosthesis, elected to trial the final prosthesis with a +1.5 and a 5 and we relocated and dislocated with a 5 mm, it was obvious that the cup had become unstable.  For this reason, we had to reposition the cup specifically removing the polyethylene liner and placing the new one, also elected at that point to place 2 screws for additional fixation of acetabulum, which again excellent fixation was obtained.  We then elected to use a +5 mm neck length on the femoral head and then finally inserted __________ in view of the patient's young age.  Given the patient's final parameters, we would not dislocate __________ was placed greater than 100 degrees flexion, full adduction, internal rotation +75 degrees.  Leg lengths were restored.  Wound was irrigated.  The T- capsulotomy was closed with interrupted Ethibond.  The iliotibial band, gluteus maximus, fascia with #1 running Ethibond, subcu tissues with 0 and 2-0 Vicryl, skin with a stapling device.  Marcaine with epinephrine was infiltrated into the skin.  Lightly compressive sterile dressing was applied.  Taken to the recovery room in stable condition.     Dyke Brackett, M.D.     WDC/MEDQ  D:  10/15/2013  T:  10/16/2013  Job:  [id number]

## 2013-10-16 NOTE — Evaluation (Signed)
Physical Therapy Evaluation Patient Details Name: [name] MRN: [id number] DOB: 07-Jun-1961 Today's Date: 10/16/2013 Time: 0902-1010 PT Time Calculation (min): 68 min  PT Assessment / Plan / Recommendation History of Present Illness  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  Clinical Impression  Patient did well for first time OOB.  Limited by pain.  Feel patient will be a little slow to progress.  Pt will need PT to continue to progress mobility and independence for return home with wife.    PT Assessment  Patient needs continued PT services    Follow Up Recommendations  Home health PT    Does the patient have the potential to tolerate intense rehabilitation      Barriers to Discharge        Equipment Recommendations  Rolling walker with 5" wheels    Recommendations for Other Services     Frequency 7X/week    Precautions / Restrictions Precautions Precautions: Posterior Hip Required Braces or Orthoses: Knee Immobilizer - Left Restrictions Weight Bearing Restrictions: Yes LLE Weight Bearing: Weight bearing as tolerated   Pertinent Vitals/Pain 9/10 during mobility.  Patient pre-medicated.      Mobility  Bed Mobility Bed Mobility: Supine to Sit Supine to Sit: 4: Min assist;HOB elevated;With rails Details for Bed Mobility Assistance: for LLLE assistance Transfers Transfers: Sit to Stand;Stand to Sit Sit to Stand: 4: Min assist;From elevated surface;With upper extremity assist;From bed Stand to Sit: 4: Min assist;With upper extremity assist;With armrests;To chair/3-in-1 Ambulation/Gait Ambulation/Gait Assistance: 4: Min assist Ambulation Distance (Feet): 10 Feet (x3) Assistive device: Rolling walker Gait Pattern: Step-to pattern;Decreased stance time - left Gait velocity: decreased    Exercises     PT Diagnosis: Acute pain  PT Problem List: Decreased range of motion;Decreased activity tolerance;Decreased mobility;Decreased knowledge of use of  DME;Decreased knowledge of precautions;Pain PT Treatment Interventions: DME instruction;Gait training;Stair training;Functional mobility training;Therapeutic activities;Therapeutic exercise     PT Goals(Current goals can be found in the care plan section) Acute Rehab PT Goals Patient Stated Goal: not hurt PT Goal Formulation: With patient Time For Goal Achievement: 10/23/13 Potential to Achieve Goals: Good  Visit Information  Last PT Received On: 10/16/13 Assistance Needed: +1 History of Present Illness: 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       Prior Functioning  Home Living Family/patient expects to be discharged to:: Private residence Living Arrangements: Spouse/significant other Available Help at Discharge: Family;Available PRN/intermittently Type of Home: House Home Access: Stairs to enter [employer] of Steps: 4 Entrance Stairs-Rails: Right Home Layout: One level Home Equipment: Bedside commode Prior Function Level of Independence: Independent Communication Communication: No difficulties    Cognition  Cognition Arousal/Alertness: Awake/alert Behavior During Therapy: WFL for tasks assessed/performed Overall Cognitive Status: Within Functional Limits for tasks assessed    Extremity/Trunk Assessment Upper Extremity Assessment Upper Extremity Assessment: Overall WFL for tasks assessed Lower Extremity Assessment Lower Extremity Assessment: Overall WFL for tasks assessed;LLE deficits/detail LLE: Unable to fully assess due to pain   Balance Balance Balance Assessed: No  End of Session PT - End of Session Equipment Utilized During Treatment: Gait belt Activity Tolerance: Patient limited by pain Patient left: in chair;with family/visitor present;with call bell/phone within reach Nurse Communication: Mobility status  GP     Olivia Canter, [place] [phone] 10/16/2013, 10:10 AM

## 2013-10-16 NOTE — Care Management Note (Unsigned)
    Page 1 of 2   10/16/2013     1:41:40 PM   CARE MANAGEMENT NOTE 10/16/2013  Patient:  [name], [name]   Account Number:  [id number]  Date Initiated:  10/16/2013  Documentation initiated by:  Letha Cape  Subjective/Objective Assessment:   dx s/p l total hip  admit-lives with spouse.     Action/Plan:   Anticipated DC Date:  10/17/2013   Anticipated DC Plan:  HOME W HOME HEALTH SERVICES      DC Planning Services  CM consult      [facility] Choice  HOME HEALTH   Choice offered to / List presented to:     DME arranged  [facility]      DME agency  [employer]     HH arranged  HH-2 PT      [facility] agency  [facility]   Status of service:  In process, will continue to follow Medicare Important Message given?   (If response is "NO", the following Medicare IM given date fields will be blank) Date Medicare IM given:   Date Additional Medicare IM given:    Discharge Disposition:    Per UR Regulation:    If discussed at Long Length of Stay Meetings, dates discussed:    Comments:  10/16/13 9:26 Letha Cape RN, BSN [phone] patient lives with spouse, patient was set up preoperatively with Quintella Baton for hhpt and [employer] for DME roling walker . NCM will continue to follow for dc needs.

## 2013-10-16 NOTE — Evaluation (Signed)
Occupational Therapy Evaluation Patient Details Name: [name] MRN: [id number] DOB: 1961/01/21 Today's Date: 10/16/2013 Time: 7829-5621 OT Time Calculation (min): 36 min  OT Assessment / Plan / Recommendation History of present illness 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   Clinical Impression   This 52 yo male admitted with above presents to acute OT with deficits below. Will benefit from acute OT to address LBADLs with AE.    OT Assessment  Patient needs continued OT Services    Follow Up Recommendations  No OT follow up       Equipment Recommendations  None recommended by OT       Frequency  Min 2X/week    Precautions / Restrictions Precautions Precautions: Posterior Hip Required Braces or Orthoses: Knee Immobilizer - Left ( )   Pertinent Vitals/Pain 8/10 LLE; repositioned, not time for pain meds    ADL  Eating/Feeding: Independent Where Assessed - Eating/Feeding: Edge of bed Grooming: Set up Where Assessed - Grooming: Unsupported sitting Upper Body Bathing: Set up Where Assessed - Upper Body Bathing: Unsupported sitting Lower Body Bathing: Maximal assistance Where Assessed - Lower Body Bathing: Unsupported sit to stand Upper Body Dressing: Set up Where Assessed - Upper Body Dressing: Unsupported sitting Lower Body Dressing: +1 Total assistance Where Assessed - Lower Body Dressing: Unsupported sit to stand Toilet Transfer: Minimal assistance Toilet Transfer Method: Sit to stand Acupuncturist:  Hydrologist) Toileting - Architect and Hygiene: Min guard Where Assessed - Engineer, mining and Hygiene: Standing Tub/Shower Transfer: Minimal assistance Web designer Method: Science writer:  (side step into tub with LLE, sit on 3n1, swing RLE into tub) Equipment Used: Rolling walker;Gait belt Transfers/Ambulation Related to ADLs: Min guard A  (except for tub transfer--see  above) with RW; pt did ambulate from 5N gym to his room 5N06    OT Diagnosis: Generalized weakness;Acute pain  OT Problem List: Decreased range of motion;Pain;Impaired balance (sitting and/or standing);Decreased knowledge of use of DME or AE OT Treatment Interventions: Self-care/ADL training;Balance training;DME and/or AE instruction;Patient/family education   OT Goals(Current goals can be found in the care plan section) Acute Rehab OT Goals OT Goal Formulation: With patient Time For Goal Achievement: 10/23/13 Potential to Achieve Goals: Good  Visit Information  Last OT Received On: 10/16/13 Assistance Needed: +1 History of Present Illness: 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       Prior Functioning     Home Living Family/patient expects to be discharged to:: Private residence Living Arrangements: Spouse/significant other Available Help at Discharge: Family;Available PRN/intermittently Type of Home: House Home Access: Stairs to enter [employer] of Steps: 4 Entrance Stairs-Rails: Right Home Layout: One level Home Equipment: Bedside commode Prior Function Level of Independence: Independent Communication Communication: No difficulties Dominant Hand: Right         Vision/Perception Vision - History Patient Visual Report: No change from baseline   Cognition  Cognition Arousal/Alertness: Awake/alert Behavior During Therapy: WFL for tasks assessed/performed Overall Cognitive Status: Within Functional Limits for tasks assessed    Extremity/Trunk Assessment Upper Extremity Assessment Upper Extremity Assessment: Overall WFL for tasks assessed     Mobility Bed Mobility Bed Mobility: Supine to Sit;Sitting - Scoot to Edge of Bed;Sit to Supine Supine to Sit: 5: Supervision;HOB flat;With rails Sitting - Scoot to Edge of Bed: 4: Min guard;With rail Sit to Supine: 4: Min assist;With rail;HOB flat (RLE) Transfers Transfers: Sit to Stand;Stand to  Sit Sit to Stand: 4:  Min guard;With upper extremity assist;From bed Stand to Sit: 4: Min guard;With upper extremity assist;With armrests;To chair/3-in-1 Details for Transfer Assistance: VCs for safe hand placement           End of Session OT - End of Session Equipment Utilized During Treatment: Gait belt;Rolling walker Activity Tolerance: Patient tolerated treatment well Patient left: in bed;with call bell/phone within reach;with family/visitor present       Evette Georges [phone] 10/16/2013, 3:19 PM

## 2013-10-16 NOTE — Progress Notes (Signed)
SPORTS MEDICINE AND JOINT REPLACEMENT  Georgena Spurling, MD   Altamese Cabal, PA-C [address] [place], [place], [place]  [address]                             [phone]   PROGRESS NOTE  Subjective:  negative for Chest Pain  negative for Shortness of Breath  negative for Nausea/Vomiting   negative for Calf Pain  negative for Bowel Movement   Tolerating Diet: yes         Patient reports pain as 5 on 0-10 scale.    Objective: Vital signs in last 24 hours:   Patient Vitals for the past 24 hrs:  BP Temp Temp src Pulse Resp SpO2 Height Weight  10/16/13 0942 - - - - - 95 % - -  10/16/13 0452 111/73 mmHg 97.9 F (36.6 C) - 106 16 96 % - -  10/16/13 0151 107/78 mmHg 98.1 F (36.7 C) - 94 16 92 % - -  10/15/13 2103 123/83 mmHg 98.2 F (36.8 C) - 101 16 95 % - -  10/15/13 1818 127/76 mmHg 97.4 F (36.3 C) - 97 16 95 % 5' 9.69" (1.77 m) 86.9 kg (191 lb 9.3 oz)  10/15/13 1800 - - - 90 10 100 % - -  10/15/13 1745 122/89 mmHg - - 94 15 100 % - -  10/15/13 1730 122/85 mmHg 97 F (36.1 C) - 82 12 100 % - -  10/15/13 1715 128/90 mmHg - - 79 17 98 % - -  10/15/13 1700 103/77 mmHg - - 68 10 100 % - -  10/15/13 1645 123/106 mmHg - - 71 11 100 % - -  10/15/13 1630 123/85 mmHg - - 74 12 100 % - -  10/15/13 1616 119/80 mmHg - - 83 14 100 % - -  10/15/13 1615 119/80 mmHg 96.8 F (36 C) - 83 17 100 % - -  10/15/13 1128 144/103 mmHg 97.7 F (36.5 C) Oral 86 18 100 % - -    @flow {1959:LAST@   Intake/Output from previous day:   11/21 0701 - 11/22 0700 In: 2650 [I.V.:2650] Out: 2200 [Urine:1800]   Intake/Output this shift:   11/22 0701 - 11/22 1900 In: -  Out: 300 [Urine:300]   Intake/Output     11/21 0701 - 11/22 0700 11/22 0701 - 11/23 0700   I.V. (mL/kg) 2650 (30.5)    Total Intake(mL/kg) 2650 (30.5)    Urine (mL/kg/hr) 1800 300 (0.9)   Blood 400    Total Output 2200 300   Net +450 -300           LABORATORY DATA:  Recent Labs  10/15/13 2045 10/16/13 0505  WBC 9.2 8.8   HGB 12.3* 11.8*  HCT 36.6* 35.5*  PLT 217 228    Recent Labs  10/15/13 2045 10/16/13 0505  NA  --  131*  K  --  4.1  CL  --  96  CO2  --  24  BUN  --  8  CREATININE 0.69 0.70  GLUCOSE  --  159*  CALCIUM  --  9.1   Lab Results  Component Value Date   INR 1.04 10/07/2013    Examination:  General appearance: alert, cooperative and no distress Extremities: Homans sign is negative, no sign of DVT  Wound Exam: clean, dry, intact   Drainage:  None: wound tissue dry  Motor Exam: EHL and FHL Intact  Sensory Exam: Deep Peroneal normal   Assessment:    1 Day Post-Op  Procedure(s) (LRB): LEFT TOTAL HIP ARTHROPLASTY (Left)  ADDITIONAL DIAGNOSIS:  Active Problems:   * No active hospital problems. *  Acute Blood Loss Anemia   Plan: Physical Therapy as ordered Weight Bearing as Tolerated (WBAT)  DVT Prophylaxis:  Lovenox  DISCHARGE PLAN: Home  DISCHARGE NEEDS: HHPT, CPM, Walker and 3-in-1 comode seat         Madine Sarr 10/16/2013, 10:58 AM

## 2013-10-17 LAB — CBC
HCT: 32 % — ABNORMAL LOW (ref 39.0–52.0)
Hemoglobin: 10.7 g/dL — ABNORMAL LOW (ref 13.0–17.0)
RDW: 13.9 % (ref 11.5–15.5)
WBC: 11 10*3/uL — ABNORMAL HIGH (ref 4.0–10.5)

## 2013-10-17 NOTE — Progress Notes (Signed)
SPORTS MEDICINE AND JOINT REPLACEMENT  Shawn Spurling, MD   Shawn Cabal, PA-C [address] [place], [place], [place]  [address]                             [phone]   PROGRESS NOTE  Subjective:  negative for Chest Pain  negative for Shortness of Breath  negative for Nausea/Vomiting   negative for Calf Pain  negative for Bowel Movement   Tolerating Diet: yes         Patient reports pain as 4 on 0-10 scale.    Objective: Vital signs in last 24 hours:   Patient Vitals for the past 24 hrs:  BP Temp Temp src Pulse Resp SpO2  10/17/13 0614 115/82 mmHg 98.1 F (36.7 C) Oral 94 18 98 %  10/17/13 0400 - - - - 18 98 %  10/17/13 0000 - - - - 18 97 %  10/16/13 2059 126/86 mmHg 98 F (36.7 C) Oral 112 18 98 %  10/16/13 2000 - - - - 18 98 %  10/16/13 1700 136/94 mmHg 98.2 F (36.8 C) Oral 102 18 96 %    @flow {1959:LAST@   Intake/Output from previous day:   11/22 0701 - 11/23 0700 In: 480 [P.O.:480] Out: 300 [Urine:300]   Intake/Output this shift:       Intake/Output     11/22 0701 - 11/23 0700 11/23 0701 - 11/24 0700   P.O. 480    I.V. (mL/kg)     Total Intake(mL/kg) 480 (5.5)    Urine (mL/kg/hr) 300 (0.1)    Blood     Total Output 300     Net +180          Urine Occurrence 3 x       LABORATORY DATA:  Recent Labs  10/15/13 2045 10/16/13 0505 10/17/13 0435  WBC 9.2 8.8 11.0*  HGB 12.3* 11.8* 10.7*  HCT 36.6* 35.5* 32.0*  PLT 217 228 231    Recent Labs  10/15/13 2045 10/16/13 0505  NA  --  131*  K  --  4.1  CL  --  96  CO2  --  24  BUN  --  8  CREATININE 0.69 0.70  GLUCOSE  --  159*  CALCIUM  --  9.1   Lab Results  Component Value Date   INR 1.04 10/07/2013    Examination:  General appearance: alert, cooperative and no distress Extremities: Homans sign is negative, no sign of DVT  Wound Exam: clean, dry, intact   Drainage:  None: wound tissue dry  Motor Exam: EHL and FHL Intact  Sensory Exam: Deep Peroneal normal   Assessment:     2 Days Post-Op  Procedure(s) (LRB): LEFT TOTAL HIP ARTHROPLASTY (Left)  ADDITIONAL DIAGNOSIS:  Active Problems:   * No active hospital problems. *  Acute Blood Loss Anemia   Plan: Physical Therapy as ordered Weight Bearing as Tolerated (WBAT)    DISCHARGE PLAN: Home  DISCHARGE NEEDS: HHPT, Walker and 3-in-1 comode seat         [name] 10/17/2013, 10:37 AM

## 2013-10-17 NOTE — Progress Notes (Signed)
Occupational Therapy Treatment Patient Details Name: [name] MRN: [id number] DOB: Dec 09, 1960 Today's Date: 10/17/2013 Time: 7253-6644 OT Time Calculation (min): 17 min  OT Assessment / Plan / Recommendation  History of present illness 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   OT comments  Pt moving well during session. Practiced with AE for LB ADLs.   Follow Up Recommendations  No OT follow up    Barriers to Discharge       Equipment Recommendations  None recommended by OT;Other (comment) (AE kit)    Recommendations for Other Services    Frequency Min 2X/week   Progress towards OT Goals Progress towards OT goals: Progressing toward goals  Plan Discharge plan remains appropriate    Precautions / Restrictions Precautions Precautions: Posterior Hip Precaution Comments: Reviewed precautions with pt Restrictions Weight Bearing Restrictions: Yes LLE Weight Bearing: Weight bearing as tolerated   Pertinent Vitals/Pain Pain 8/10.  Increased activity during session.     ADL  Lower Body Dressing: Minimal assistance Where Assessed - Lower Body Dressing: Supported sit to stand Toilet Transfer: Hydrographic surveyor Method: Sit to Barista: Other (comment) (from recliner chair) Equipment Used: Gait belt;Rolling walker;Sock aid;Reacher;Long-handled sponge;Long-handled shoe horn Transfers/Ambulation Related to ADLs: Min guard  ADL Comments: Educated on AE for LB ADLs. Pt practiced donning/doffing underwear and donning/doffing shoe and sock. Cues for precautions and overall Min A. Assisted with donning shoe and trying to prevent left foot from turning in when taking sock off. Educated to stand in front of chair/bed with walker in front when pulling up LB clothing.     OT Diagnosis:    OT Problem List:   OT Treatment Interventions:     OT Goals(current goals can now be found in the care plan section) Acute Rehab OT Goals Patient Stated  Goal: go home OT Goal Formulation: With patient Time For Goal Achievement: 10/23/13 Potential to Achieve Goals: Good ADL Goals Pt Will Perform Grooming: with set-up;with supervision;standing (2 tasks at sink) Pt Will Perform Lower Body Dressing: with set-up;with supervision;with adaptive equipment;sit to/from stand  Visit Information  Last OT Received On: 10/17/13 Assistance Needed: +1 History of Present Illness: 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    Subjective Data      Prior Functioning       Cognition  Cognition Arousal/Alertness: Awake/alert Behavior During Therapy: WFL for tasks assessed/performed Overall Cognitive Status: Within Functional Limits for tasks assessed    Mobility  Bed Mobility Bed Mobility: Not assessed Transfers Transfers: Sit to Stand;Stand to Sit Sit to Stand: 4: Min guard;With upper extremity assist;From chair/3-in-1 Stand to Sit: 4: Min guard;With upper extremity assist;To chair/3-in-1 Details for Transfer Assistance: Cues for positioning of LLE.     Exercises      Balance     End of Session OT - End of Session Equipment Utilized During Treatment: Gait belt;Rolling walker Activity Tolerance: Patient tolerated treatment well Patient left: in chair;with call bell/phone within reach;with family/visitor present  Lorri Frederick OTR/L [phone] 10/17/2013, 12:29 PM

## 2013-10-17 NOTE — Progress Notes (Signed)
   CARE MANAGEMENT NOTE 10/17/2013  Patient:  [name], [name]   Account Number:  [id number]  Date Initiated:  10/16/2013  Documentation initiated by:  Letha Cape  Subjective/Objective Assessment:   dx s/p l total hip  admit-lives with spouse.     Action/Plan:   Anticipated DC Date:  10/17/2013   Anticipated DC Plan:  HOME W HOME HEALTH SERVICES      DC Planning Services  CM consult      [facility] Choice  HOME HEALTH   Choice offered to / List presented to:     DME arranged  [facility]      DME agency  [employer]     HH arranged  HH-2 PT      [facility] agency  [facility]   Status of service:  Completed, signed off Medicare Important Message given?   (If response is "NO", the following Medicare IM given date fields will be blank) Date Medicare IM given:   Date Additional Medicare IM given:    Discharge Disposition:  HOME W HOME HEALTH SERVICES  Per UR Regulation:    If discussed at Long Length of Stay Meetings, dates discussed:    Comments:  10/17/13 12:10 CM notified Genevieve Norlander (on campus) of discharge.  No other CM needs were communicated.  Freddy Jaksch, BSN, [place] [phone].   10/16/13 9:26 Letha Cape RN, BSN [phone] patient lives with spouse, patient was set up preoperatively with Quintella Baton for hhpt and [employer] for DME roling walker . NCM will continue to follow for dc needs.

## 2013-10-17 NOTE — Progress Notes (Signed)
Utilization Review Completed.[name] T11/23/2014

## 2013-10-17 NOTE — Progress Notes (Signed)
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   PT Comments   Good progress today; Stairtraining complete; OK for dc from PT standpoint   Follow Up Recommendations  Home health PT     Does the patient have the potential to tolerate intense rehabilitation     Barriers to Discharge        Equipment Recommendations  Rolling walker with 5" wheels    Recommendations for Other Services    Frequency 7X/week   Progress towards PT Goals Progress towards PT goals: Progressing toward goals  Plan Current plan remains appropriate    Precautions / Restrictions Precautions Precautions: Posterior Hip Precaution Comments: Reviewed precautions with pt Restrictions Weight Bearing Restrictions: Yes LLE Weight Bearing: Weight bearing as tolerated   Pertinent Vitals/Pain 5/10 L hip pain    Mobility  Bed Mobility Bed Mobility: Not assessed Transfers Transfers: Sit to Stand;Stand to Sit Sit to Stand: 4: Min guard;With upper extremity assist;From chair/3-in-1;5: Supervision Stand to Sit: 4: Min guard;With upper extremity assist;To chair/3-in-1;5: Supervision Details for Transfer Assistance: Cues for positioning of LLE. and hip prec Ambulation/Gait Ambulation/Gait Assistance: 4: Min guard;5: Supervision Ambulation Distance (Feet): 100 Feet Assistive device: Rolling walker Ambulation/Gait Assistance Details: Minguard progressing to supervision; noted emerging step-through gait Gait Pattern: Step-through pattern Gait velocity: decreased Stairs: Yes Stairs Assistance: 4: Min guard Stair Management Technique: One rail Right;Forwards Number of Stairs: 5 (verbal and demo cues for technique and sequence)    Exercises     PT Diagnosis:    PT  Problem List:   PT Treatment Interventions:     PT Goals (current goals can now be found in the care plan section) Acute Rehab PT Goals Patient Stated Goal: go home  Visit Information  Last PT Received On: 10/17/13 Assistance Needed: +1 History of Present Illness: 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    Subjective Data  Subjective: Looking forward to going hoem Patient Stated Goal: go home   Cognition  Cognition Arousal/Alertness: Awake/alert Behavior During Therapy: WFL for tasks assessed/performed Overall Cognitive Status: Within Functional Limits for tasks assessed    Balance     End of Session PT - End of Session Activity Tolerance: Patient tolerated treatment well Patient left: in chair;with call bell/phone within reach;with family/visitor present Nurse Communication: Mobility status;Other (comment) (OK for dc)   GP     Van Clines [place], [place] [phone]  10/17/2013, 12:38 PM

## 2013-10-19 ENCOUNTER — Encounter (HOSPITAL_COMMUNITY): Payer: Self-pay | Admitting: Orthopedic Surgery

## 2013-10-19 NOTE — Discharge Summary (Signed)
SPORTS MEDICINE & JOINT REPLACEMENT   Georgena Spurling, MD   Altamese Cabal, PA-C [address] [place], [place], [place]  [address]                             [phone]  PATIENT ID: [id number]        MRN:  [id number]          DOB/AGE: [birth year] / 52 y.o.    DISCHARGE SUMMARY  ADMISSION DATE:    10/15/2013 DISCHARGE DATE:   10/17/2013  ADMISSION DIAGNOSIS: OA LEFT HIP    DISCHARGE DIAGNOSIS:  OSTEOARTHRITIS LEFT HIP    ADDITIONAL DIAGNOSIS: Active Problems:   * No active hospital problems. *  History reviewed. No pertinent past medical history.  PROCEDURE: Procedure(s): LEFT TOTAL HIP ARTHROPLASTY on 10/15/2013  CONSULTS:     HISTORY:  See H&P in chart  HOSPITAL COURSE:  DOMNIQUE VANTINE is a 52 y.o. admitted on 10/15/2013 and found to have a diagnosis of OSTEOARTHRITIS LEFT HIP.  After appropriate laboratory studies were obtained  they were taken to the operating room on 10/15/2013 and underwent Procedure(s): LEFT TOTAL HIP ARTHROPLASTY.   They were given perioperative antibiotics:  Anti-infectives   Start     Dose/Rate Route Frequency Ordered Stop   10/15/13 2200  ceFAZolin (ANCEF) IVPB 1 g/50 mL premix     1 g 100 mL/hr over 30 Minutes Intravenous 3 times per day 10/15/13 1828 10/16/13 0627   10/15/13 1117  ceFAZolin (ANCEF) 2-3 GM-% IVPB SOLR    Comments:  Rogelia Mire   : cabinet override      10/15/13 1117 10/15/13 2329   10/15/13 0600  ceFAZolin (ANCEF) IVPB 2 g/50 mL premix     2 g 100 mL/hr over 30 Minutes Intravenous On call to O.R. 10/14/13 1610 10/15/13 1345    .  Tolerated the procedure well.  Placed with a foley intraoperatively.  Given Ofirmev at induction and for 48 hours.    POD# 1: Vital signs were stable.  Patient denied Chest pain, shortness of breath, or calf pain.  Patient was started on Lovenox 30 mg subcutaneously twice daily at 8am.  Consults to PT, OT, and care management were made.  The patient was weight bearing as tolerated. .   Incentive spirometry was taught.  Dressing was changed.        POD #2, Continued  PT for ambulation and exercise program.  IV saline locked.  O2 discontinued.    The remainder of the hospital course was dedicated to ambulation and strengthening.   The patient was discharged on 2 days post op in  Good condition.  Blood products given:none  DIAGNOSTIC STUDIES: Recent vital signs: No data found.      Recent laboratory studies:  Recent Labs  10/15/13 2045 10/16/13 0505 10/17/13 0435  WBC 9.2 8.8 11.0*  HGB 12.3* 11.8* 10.7*  HCT 36.6* 35.5* 32.0*  PLT 217 228 231    Recent Labs  10/15/13 2045 10/16/13 0505  NA  --  131*  K  --  4.1  CL  --  96  CO2  --  24  BUN  --  8  CREATININE 0.69 0.70  GLUCOSE  --  159*  CALCIUM  --  9.1   Lab Results  Component Value Date   INR 1.04 10/07/2013     Recent Radiographic Studies :  Dg Chest 2 View  10/07/2013  CLINICAL DATA:  Smoker. No chest complaints. Prior to left total hip arthroplasty.  EXAM: CHEST  2 VIEW  COMPARISON:  None.  FINDINGS: The heart size and mediastinal contours are within normal limits. Both lungs are clear. The visualized skeletal structures are unremarkable.  IMPRESSION: No active cardiopulmonary disease.   Electronically Signed   By: Elige Ko   On: 10/07/2013 13:56   Dg Pelvis Portable  10/15/2013   CLINICAL DATA:  Status post left hip replacement.  EXAM: PORTABLE PELVIS 1-2 VIEWS  COMPARISON:  Left hip radiographs obtained today.  FINDINGS: Left total hip prosthesis in satisfactory position and alignment. The distal femoral component is not included on this radiograph. No fracture or dislocation seen. Lower lumbar spine degenerative changes and mild right hip degenerative changes.  IMPRESSION: Satisfactory postoperative appearance of a left total hip prosthesis.   Electronically Signed   By: Gordan Payment M.D.   On: 10/15/2013 17:37   Dg Hip Portable 1 View Left  10/15/2013   CLINICAL DATA:   Postoperative left hip replacement  EXAM: PORTABLE LEFT HIP - 1 VIEW  COMPARISON:  May 07, 2013  FINDINGS: Patient status post left hip replacement with no malalignment seen. There is soft tissue swelling, air and skin staples consistent with recent surgery.  IMPRESSION: Postoperative changes from recent left hip replacement. Left replacement is identified without malalignment.   Electronically Signed   By: Sherian Rein M.D.   On: 10/15/2013 17:35    DISCHARGE INSTRUCTIONS:   DISCHARGE MEDICATIONS:     Medication List         enoxaparin 30 MG/0.3ML injection  Commonly known as:  LOVENOX  Inject 0.3 mLs (30 mg total) into the skin every 12 (twelve) hours.     methocarbamol 500 MG tablet  Commonly known as:  ROBAXIN  Take 1 tablet (500 mg total) by mouth 4 (four) times daily.     oxyCODONE-acetaminophen 5-325 MG per tablet  Commonly known as:  ROXICET  1-2 tabs po q4-6hrs prn pain        FOLLOW UP VISIT:       Follow-up Information   Follow up with CAFFREY JR,W D, MD. Schedule an appointment as soon as possible for a visit in 2 weeks.   Specialty:  Orthopedic Surgery   Contact information:   [address] [place] [address] [phone]       Follow up with [facility] (home health physical therapy)    Contact information:   [address] [place] [address] [phone]       DISPOSITION: HOME   CONDITION:  Good   Joey Hudock 10/19/2013, 10:42 AM

## 2013-10-25 NOTE — Anesthesia Postprocedure Evaluation (Signed)
  Anesthesia Post-op Note  Patient: [name]  Procedure(s) Performed: Procedure(s): LEFT TOTAL HIP ARTHROPLASTY (Left)  Patient Location: PACU  Anesthesia Type:General  Level of Consciousness: awake  Airway and Oxygen Therapy: Patient Spontanous Breathing  Post-op Pain: mild  Post-op Assessment: Post-op Vital signs reviewed  Post-op Vital Signs: stable  Complications: No apparent anesthesia complications

## 2013-11-11 ENCOUNTER — Emergency Department (HOSPITAL_COMMUNITY)
Admission: EM | Admit: 2013-11-11 | Discharge: 2013-11-11 | Disposition: A | Discharge status: Home / Self Care | Payer: BC Managed Care – PPO | Attending: Emergency Medicine | Admitting: Emergency Medicine

## 2013-11-11 ENCOUNTER — Encounter (HOSPITAL_COMMUNITY): Payer: Self-pay | Admitting: Emergency Medicine

## 2013-11-11 DIAGNOSIS — K0889 Other specified disorders of teeth and supporting structures: Secondary | ICD-10-CM

## 2013-11-11 DIAGNOSIS — Z7901 Long term (current) use of anticoagulants: Secondary | ICD-10-CM | POA: Insufficient documentation

## 2013-11-11 DIAGNOSIS — Z79899 Other long term (current) drug therapy: Secondary | ICD-10-CM | POA: Insufficient documentation

## 2013-11-11 DIAGNOSIS — K089 Disorder of teeth and supporting structures, unspecified: Secondary | ICD-10-CM | POA: Insufficient documentation

## 2013-11-11 DIAGNOSIS — K006 Disturbances in tooth eruption: Secondary | ICD-10-CM | POA: Insufficient documentation

## 2013-11-11 DIAGNOSIS — R1110 Vomiting, unspecified: Secondary | ICD-10-CM | POA: Insufficient documentation

## 2013-11-11 DIAGNOSIS — F17200 Nicotine dependence, unspecified, uncomplicated: Secondary | ICD-10-CM | POA: Insufficient documentation

## 2013-11-11 MED ORDER — PENICILLIN V POTASSIUM 500 MG PO TABS: 500.0000 mg | ORAL_TABLET | Freq: Four times a day (QID) | ORAL | Status: DC

## 2013-11-11 MED ORDER — BUPIVACAINE-EPINEPHRINE PF 0.5-1:200000 % IJ SOLN
1.8000 mL | Freq: Once | INTRAMUSCULAR | Status: DC
  Filled 2013-11-11: qty 1.8

## 2013-11-11 NOTE — ED Notes (Signed)
Pt states dental pain for 1 day. Swelling to right side of face, with vomiting noted. Pt had hip replacement 2 weeks ago. Pt moaning, and rocking back and forth.

## 2013-11-11 NOTE — ED Provider Notes (Signed)
Medical screening examination/treatment/procedure(s) were performed by non-physician practitioner and as supervising physician I was immediately available for consultation/collaboration.  Flint Melter, MD 11/11/13 1800

## 2013-11-11 NOTE — ED Provider Notes (Signed)
CSN: [id number]     Arrival date & time 11/11/13  1355 History   First MD Initiated Contact with Patient 11/11/13 1413     Chief Complaint  Patient presents with  . Dental Pain  . Emesis   (Consider location/radiation/quality/duration/timing/severity/associated sxs/prior Treatment) HPI Comments: Patient presents to the emergency department with a dental complaint. Symptoms began last night. The patient has tried to alleviate pain with percocet.  Pain rated at a 10/10, characterized as throbbing in nature and located upper incisors. Patient denies fever, night sweats, chills, difficulty swallowing or opening mouth, SOB, nuchal rigidity or decreased ROM of neck.  Patient states that he recently had a hip replacement, and he is afraid that the infection is going to spread to his hip.  Patient does not have a dentist and requests a resource guide at discharge.   The history is provided by the patient. No language interpreter was used.    History reviewed. No pertinent past medical history. Past Surgical History  Procedure Laterality Date  . Appendectomy    . Hernia repair    . Total hip arthroplasty Left 10/15/2013    Procedure: LEFT TOTAL HIP ARTHROPLASTY;  Surgeon: Thera Flake., MD;  Location: [facility] OR;  Service: Orthopedics;  Laterality: Left;   Family History  Problem Relation Age of Onset  . Colon cancer Brother   . Liver cancer Brother   . Diabetes Paternal Uncle   . Stroke Neg Hx   . Learning disabilities Neg Hx   . Hypertension Neg Hx   . Heart disease Neg Hx    History  Substance Use Topics  . Smoking status: Current Every Day Smoker -- 0.25 packs/day for 4 years    Types: Cigarettes  . Smokeless tobacco: Never Used  . Alcohol Use: Yes    Review of Systems  All other systems reviewed and are negative.    Allergies  Review of patient's allergies indicates no known allergies.  Home Medications   Current Outpatient Rx  Name  Route  Sig  Dispense  Refill  .  acetaminophen (TYLENOL) 500 MG tablet   Oral   Take 1,000 mg by mouth every 6 (six) hours as needed (pain).         Marland Kitchen enoxaparin (LOVENOX) 30 MG/0.3ML injection   Subcutaneous   Inject 0.3 mLs (30 mg total) into the skin every 12 (twelve) hours.   24 Syringe   0   . methocarbamol (ROBAXIN) 500 MG tablet   Oral   Take 1 tablet (500 mg total) by mouth 4 (four) times daily.   60 tablet   0   . oxyCODONE-acetaminophen (ROXICET) 5-325 MG per tablet      1-2 tabs po q4-6hrs prn pain   100 tablet   0    BP 130/89  Pulse 114  Temp(Src) 97.9 F (36.6 C) (Oral)  Resp 28  SpO2 100% Physical Exam  Nursing note and vitals reviewed. Constitutional: He is oriented to person, place, and time. He appears well-developed and well-nourished.  HENT:  Head: Normocephalic and atraumatic.  Mouth/Throat:    Poor dentition throughout.  Affected tooth as diagrammed.  No signs of peritonsillar or tonsillar abscess.  No signs of gingival abscess. Oropharynx is clear and without exudates.  Uvula is midline.  Airway is intact. No signs of Ludwig's angina with palpation of oral and sublingual mucosa.   Eyes: Conjunctivae and EOM are normal.  Neck: Normal range of motion.  Cardiovascular: Normal rate.  Pulmonary/Chest: Effort normal.  Abdominal: He exhibits no distension.  Musculoskeletal: Normal range of motion.  Neurological: He is alert and oriented to person, place, and time.  Skin: Skin is dry.  Psychiatric: He has a normal mood and affect. His behavior is normal. Judgment and thought content normal.    ED Course  Procedures (including critical care time) Labs Review Labs Reviewed - No data to display Imaging Review No results found.  EKG Interpretation   None       MDM   1. Pain, dental    Patient with toothache.  No gross abscess.  Exam unconcerning for Ludwig's angina or spread of infection.  Will treat with penicillin.  Urged patient to follow-up with dentist.        Roxy Horseman, PA-C 11/11/13 1553

## 2013-11-11 NOTE — ED Notes (Signed)
Per EMS. Pt complains of R tooth abscess, was unable to schedule appointment with dentist, so came here for eval. Had hip replacement 11/21.

## 2014-01-21 ENCOUNTER — Encounter: Payer: Self-pay | Admitting: Neurology

## 2014-01-24 ENCOUNTER — Encounter: Payer: Self-pay | Admitting: Neurology

## 2014-01-24 ENCOUNTER — Encounter (INDEPENDENT_AMBULATORY_CARE_PROVIDER_SITE_OTHER): Payer: Self-pay

## 2014-01-24 ENCOUNTER — Ambulatory Visit (INDEPENDENT_AMBULATORY_CARE_PROVIDER_SITE_OTHER): Payer: BC Managed Care – PPO | Admitting: Neurology

## 2014-01-24 VITALS — BP 125/88 | HR 102 | Ht 69.0 in | Wt 190.0 lb

## 2014-01-24 DIAGNOSIS — R531 Weakness: Secondary | ICD-10-CM

## 2014-01-24 DIAGNOSIS — R5381 Other malaise: Secondary | ICD-10-CM

## 2014-01-24 DIAGNOSIS — R5383 Other fatigue: Secondary | ICD-10-CM

## 2014-01-24 NOTE — Progress Notes (Signed)
[facility]    Provider:  Dr Hosie Poisson Referring Provider: Nicki Reaper, NP Primary Care Physician:  Nicki Reaper, NP  CC:  Diffuse weakness after THR  HPI:  [name] is a 53 y.o. male here as a referral from Dr. Sampson Si for diffuse weakness after THR 3 months ago. Feels symptoms started after hip replacement. Describes bilateral aching sensation in hips and muscles of lower extremities. Describes pain as a sharp aching sensation. Feels weakness throughout, notes severe weakness in proximal muscles. Describes some muscle weakness and pain in proximal bilateral LE. Notes difficulty raising arms bilaterally, again starting after hip replacement. Notes frequent falls, having difficulty getting in and out of a car. Has completed physical therapy, feels it has not given him much benefit. Symptoms get worse as the day goes on. Notes some atrophy of bilateral hip flexors.   No history of trauma to neck or back. No prior history of muscle weakness. No family history of neurological disorders.   Ortho notes: Noted diffuse weakness with falls and pain. Note muscle stiffness. Increased reflexes with question of quad atrophy.   Review of Systems: Out of a complete 14 system review, the patient complains of only the following symptoms, and all other reviewed systems are negative. + gait instability, muscle weakness, fatigue, pain  History   Social History  . Marital Status: Married    Spouse Name: Lupita Leash    Number of Children: 0  . Years of Education: 12   Occupational History  . Furniture    Social History Main Topics  . Smoking status: Current Every Day Smoker -- 0.25 packs/day for 4 years    Types: Cigarettes  . Smokeless tobacco: Never Used  . Alcohol Use: Yes  . Drug Use: No  . Sexual Activity: Yes    Birth Control/ Protection: None   Other Topics Concern  . Not on file   Social History Narrative   Regular exercise-no   Caffeine Use-yes   Patient lives  at home with wife Lupita Leash.   Patient has no children.   Patient has a 12th grade education.           Family History  Problem Relation Age of Onset  . Colon cancer Brother   . Liver cancer Brother   . Diabetes Paternal Uncle   . Stroke Neg Hx   . Learning disabilities Neg Hx   . Hypertension Neg Hx   . Heart disease Neg Hx     History reviewed. No pertinent past medical history.  Past Surgical History  Procedure Laterality Date  . Appendectomy    . Hernia repair    . Total hip arthroplasty Left 10/15/2013    Procedure: LEFT TOTAL HIP ARTHROPLASTY;  Surgeon: Thera Flake., MD;  Location: [facility] OR;  Service: Orthopedics;  Laterality: Left;    No current outpatient prescriptions on file.   No current facility-administered medications for this visit.    Allergies as of 01/24/2014  . (No Known Allergies)    Vitals: BP 125/88  Pulse 102  Ht 5\' 9"  (1.753 m)  Wt 190 lb (86.183 kg)  BMI 28.05 kg/m2 Last Weight:  Wt Readings from Last 1 Encounters:  01/24/14 190 lb (86.183 kg)   Last Height:   Ht Readings from Last 1 Encounters:  01/24/14 5\' 9"  (1.753 m)     Physical exam: Exam: Gen: NAD, conversant Eyes: anicteric sclerae, moist conjunctivae HENT: Atraumatic, oropharynx clear Neck: Trachea midline; supple,  Lungs: CTA, no  wheezing, rales, rhonic                          CV: RRR, no MRG Abdomen: Soft, non-tender;  Extremities: No peripheral edema  Skin: Normal temperature, no rash,  Psych: Appropriate affect, pleasant  Neuro: MS: AA&Ox3, appropriately interactive, normal affect   Speech: fluent w/o paraphasic error  Memory: good recent and remote recall  CN: PERRL, EOMI no nystagmus, no ptosis, sensation intact to LT V1-V3 bilat, face symmetric, no weakness, hearing grossly intact, palate elevates symmetrically, shoulder shrug 5/5 bilat,  tongue protrudes midline, no fasiculations noted.  Motor: mild atrophy of proximal muscles of bilateral  LE Decreased ROM bilateral shoulders, likely pain related Proximal weakness bilateral UE and LE   Coord: rapid alternating and point-to-point (FNF, HTS) movements intact.  Reflexes: brisk reflexes throughout, equivocal bilateral plantar response, no clonus noted  Sens: LT intact in all extremities  Gait: stands slowly, pushes off to stand, mildly stooped posture, slow unsteady, difficulty climbing onto exam table   Assessment:  After physical and neurologic examination, review of laboratory studies, imaging, neurophysiology testing and pre-existing records, assessment will be reviewed on the problem list.  Plan:  Treatment plan and additional workup will be reviewed under Problem List.  1)Weakness 2)Pain  52y/o gentleman presenting for initial evaluation of diffuse weakness and pain after THR 3 months ago. Weakness is predominantly proximal LE>UE, there is some atrophy and brisk reflexes throughout. Unclear etiology of symptoms. Differential would include a cervical spine process vs a myopathic process. Will check MRI C spine and EMG/NCS. Would also consider a rheumatological/autoimmune process, patient has had blood work drawn with referring doctor, instructed to have lab results sent. Would consider further testing based on results. Follow up once MRI and EMG completed.   Elspeth ChoPeter Cherish Runde, DO  [facility] [address] [place], [place] [id number]  Phone [phone] Fax [phone]

## 2014-01-24 NOTE — Patient Instructions (Signed)
Overall you are doing fairly well but I do want to suggest a few things today:   As far as diagnostic testing:  1)We will order a MRI of your cervical spine, you will be called to schedule this 2)We will order a EMG/Nervice conduction study, please schedule this upon checking out  Please have your lab tests sent over to our office  I would like to see you back once MRI is completed, sooner if we need to. Please call us with any interim questions, concerns, problems, updates or refill requests.   Please also call us for any test results so we can go over those with you on the phone.  My clinical assistant and will answer any of your questions and relay your messages to me and also relay most of my messages to you.   Our phone number is [phone]. We also have an after hours call service for urgent matters and there is a physician on-call for urgent questions. For any emergencies you know to call 911 or go to the nearest emergency room

## 2014-02-02 ENCOUNTER — Ambulatory Visit (INDEPENDENT_AMBULATORY_CARE_PROVIDER_SITE_OTHER): Payer: BC Managed Care – PPO

## 2014-02-04 ENCOUNTER — Ambulatory Visit (INDEPENDENT_AMBULATORY_CARE_PROVIDER_SITE_OTHER): Payer: BC Managed Care – PPO | Admitting: Neurology

## 2014-02-04 ENCOUNTER — Encounter (INDEPENDENT_AMBULATORY_CARE_PROVIDER_SITE_OTHER): Payer: Self-pay

## 2014-02-04 DIAGNOSIS — M79609 Pain in unspecified limb: Secondary | ICD-10-CM

## 2014-02-04 DIAGNOSIS — R531 Weakness: Secondary | ICD-10-CM

## 2014-02-04 NOTE — Procedures (Signed)
HISTORY:  [name] is a 53 year old gentleman with a history of a total hip replacement on the left in November 2014. The patient has had some issues with ongoing hip discomfort bilaterally since that time, and he also developed bilateral shoulder discomfort, right greater than left. The pain in the hips comes on with weightbearing, and is worse if he is inactive for a long period of time prior to trying to walk. The pain essentially dissipates when he is sitting or lying down. With the shoulders, the pain comes on with abduction of the arms, the pain goes away with rest. The patient denies any back pain or neck discomfort.  NERVE CONDUCTION STUDIES:  Nerve conduction studies were performed on left upper extremity. The distal motor latencies and motor amplitudes for the median and ulnar nerves were within normal limits. The F wave latencies and nerve conduction velocities for these nerves were also normal. The sensory latencies for the median and ulnar nerves were normal.  Nerve conduction studies were performed on both lower extremities. The distal motor latencies and motor amplitudes for the peroneal and posterior tibial nerves were within normal limits. The nerve conduction velocities for these nerves were also normal. The H reflex latencies were normal. The sensory latencies for the peroneal nerves were within normal limits.   EMG STUDIES:  EMG study was performed on the left lower extremity:  The tibialis anterior muscle reveals 2 to 4K motor units with full recruitment. No fibrillations or positive waves were seen. The peroneus tertius muscle reveals 2 to 4K motor units with full recruitment. No fibrillations or positive waves were seen. The medial gastrocnemius muscle reveals 1 to 3K motor units with full recruitment. No fibrillations or positive waves were seen. The vastus lateralis muscle reveals 2 to 4K motor units with full recruitment. No fibrillations or positive waves  were seen. The iliopsoas muscle reveals 2 to 4K motor units with full recruitment. No fibrillations or positive waves were seen. The biceps femoris muscle (long head) reveals 2 to 4K motor units with full recruitment. No fibrillations or positive waves were seen. The lumbosacral paraspinal muscles were tested at 3 levels, and revealed no abnormalities of insertional activity at all 3 levels tested. There was fair relaxation.  EMG study was performed on the right lower extremity:  The tibialis anterior muscle reveals 2 to 4K motor units with full recruitment. No fibrillations or positive waves were seen. The peroneus tertius muscle reveals 2 to 4K motor units with full recruitment. No fibrillations or positive waves were seen. The medial gastrocnemius muscle reveals 1 to 3K motor units with full recruitment. No fibrillations or positive waves were seen. The vastus lateralis muscle reveals 2 to 4K motor units with full recruitment. No fibrillations or positive waves were seen. The iliopsoas muscle reveals 2 to 4K motor units with full recruitment. No fibrillations or positive waves were seen. The biceps femoris muscle (long head) reveals 2 to 4K motor units with full recruitment. No fibrillations or positive waves were seen. The lumbosacral paraspinal muscles were tested at 3 levels, and revealed no abnormalities of insertional activity at all 3 levels tested. There was good relaxation.   IMPRESSION:  Nerve conduction studies done on the left upper extremity and both lower extremities were within normal limits. No evidence of a peripheral neuropathy is seen. EMG evaluations on both lower extremities were unremarkable, without evidence of a lumbosacral radiculopathy on either side. There is no evidence of a myopathic disorder.  Marlan Palau. Keith Laela Deviney MD 02/04/2014 10:58 AM  [facility] [address] [place], [place] [id number]  Phone [phone] Fax [phone]

## 2014-02-07 NOTE — Progress Notes (Signed)
Quick Note:  Shared normal EEG results with patient, he verbalized understanding ______ 

## 2014-02-17 ENCOUNTER — Telehealth: Payer: Self-pay | Admitting: Neurology

## 2014-02-17 NOTE — Telephone Encounter (Signed)
Called patient to see if he has scheduled his MRI of cervical spine yet, no answer, left message to return the call.

## 2014-02-17 NOTE — Telephone Encounter (Signed)
Message copied by Dan MakerWRIGHT, TASHIA L on Thu Feb 17, 2014  4:22 PM ------      Message from: Ramond MarrowSUMNER, PETER J      Created: Thu Feb 17, 2014  3:37 PM       Hi Andreas Blowerashia,            Can you please give Mr Elmon ElseWilhite a call and see if he has scheduled his MRI of the cervical spine yet. Thanks. ------

## 2014-02-18 ENCOUNTER — Other Ambulatory Visit: Payer: Self-pay | Admitting: Neurology

## 2014-02-18 DIAGNOSIS — R2681 Unsteadiness on feet: Secondary | ICD-10-CM

## 2014-02-18 DIAGNOSIS — R531 Weakness: Secondary | ICD-10-CM

## 2014-02-18 NOTE — Telephone Encounter (Signed)
Called patient to inquire about MRI, patient states that he had an appointment, but is very claustrophobic, request to have an open MRI instead.

## 2014-03-07 ENCOUNTER — Other Ambulatory Visit: Payer: Self-pay | Admitting: Neurology

## 2014-03-07 ENCOUNTER — Telehealth: Payer: Self-pay | Admitting: Neurology

## 2014-03-07 MED ORDER — ALPRAZOLAM 0.25 MG PO TABS: 0.2500 mg | ORAL_TABLET | Freq: Once | ORAL | Status: DC | PRN

## 2014-03-07 NOTE — Telephone Encounter (Signed)
Please let him know a prescription will be sent to his pharmacy for xanax. Thanks. Let him know he will need someone to drive him home if he plans to take xanax.

## 2014-03-08 NOTE — Telephone Encounter (Signed)
Informed patient of Dr Minus BreedingSumner's phone note,he verbalized understanding

## 2014-06-25 ENCOUNTER — Encounter (HOSPITAL_COMMUNITY): Payer: Self-pay | Admitting: Emergency Medicine

## 2014-06-25 ENCOUNTER — Encounter (HOSPITAL_COMMUNITY): Payer: Self-pay | Admitting: *Deleted

## 2014-06-25 ENCOUNTER — Emergency Department (HOSPITAL_COMMUNITY)
Admission: EM | Admit: 2014-06-25 | Discharge: 2014-06-25 | Disposition: A | Discharge status: Other Acute Inpatient Hospital | Payer: No Typology Code available for payment source | Attending: Emergency Medicine | Admitting: Emergency Medicine

## 2014-06-25 ENCOUNTER — Inpatient Hospital Stay (HOSPITAL_COMMUNITY)
Admission: AD | Admit: 2014-06-25 | Discharge: 2014-06-28 | DRG: 897 | Disposition: A | Discharge status: Home / Self Care | Payer: Self-pay | Source: Intra-hospital | Attending: Psychiatry | Admitting: Psychiatry

## 2014-06-25 DIAGNOSIS — Z7141 Alcohol abuse counseling and surveillance of alcoholic: Secondary | ICD-10-CM | POA: Diagnosis present

## 2014-06-25 DIAGNOSIS — R45851 Suicidal ideations: Secondary | ICD-10-CM | POA: Insufficient documentation

## 2014-06-25 DIAGNOSIS — F1020 Alcohol dependence, uncomplicated: Secondary | ICD-10-CM | POA: Diagnosis present

## 2014-06-25 DIAGNOSIS — G4700 Insomnia, unspecified: Secondary | ICD-10-CM | POA: Diagnosis present

## 2014-06-25 DIAGNOSIS — F17200 Nicotine dependence, unspecified, uncomplicated: Secondary | ICD-10-CM | POA: Insufficient documentation

## 2014-06-25 DIAGNOSIS — F10230 Alcohol dependence with withdrawal, uncomplicated: Secondary | ICD-10-CM

## 2014-06-25 DIAGNOSIS — F1010 Alcohol abuse, uncomplicated: Secondary | ICD-10-CM | POA: Insufficient documentation

## 2014-06-25 DIAGNOSIS — Z833 Family history of diabetes mellitus: Secondary | ICD-10-CM

## 2014-06-25 DIAGNOSIS — F329 Major depressive disorder, single episode, unspecified: Secondary | ICD-10-CM | POA: Diagnosis present

## 2014-06-25 DIAGNOSIS — F10239 Alcohol dependence with withdrawal, unspecified: Principal | ICD-10-CM | POA: Diagnosis present

## 2014-06-25 DIAGNOSIS — Z96649 Presence of unspecified artificial hip joint: Secondary | ICD-10-CM

## 2014-06-25 DIAGNOSIS — F10939 Alcohol use, unspecified with withdrawal, unspecified: Principal | ICD-10-CM | POA: Diagnosis present

## 2014-06-25 DIAGNOSIS — R000 Tachycardia, unspecified: Secondary | ICD-10-CM | POA: Insufficient documentation

## 2014-06-25 DIAGNOSIS — Z800 Family history of malignant neoplasm of digestive organs: Secondary | ICD-10-CM

## 2014-06-25 DIAGNOSIS — IMO0001 Reserved for inherently not codable concepts without codable children: Secondary | ICD-10-CM | POA: Diagnosis present

## 2014-06-25 DIAGNOSIS — Z9889 Other specified postprocedural states: Secondary | ICD-10-CM | POA: Insufficient documentation

## 2014-06-25 DIAGNOSIS — I10 Essential (primary) hypertension: Secondary | ICD-10-CM | POA: Diagnosis present

## 2014-06-25 HISTORY — DX: Anxiety disorder, unspecified: F41.9

## 2014-06-25 HISTORY — DX: Alcohol abuse, uncomplicated: F10.10

## 2014-06-25 LAB — RAPID URINE DRUG SCREEN, HOSP PERFORMED
Amphetamines: NOT DETECTED
BENZODIAZEPINES: NOT DETECTED
Barbiturates: NOT DETECTED
Cocaine: NOT DETECTED
Opiates: NOT DETECTED
Tetrahydrocannabinol: NOT DETECTED

## 2014-06-25 LAB — SALICYLATE LEVEL: Salicylate Lvl: <2 mg/dL — ABNORMAL LOW (ref 2.8–20.0)

## 2014-06-25 LAB — COMPREHENSIVE METABOLIC PANEL
ALBUMIN: 4.2 g/dL (ref 3.5–5.2)
ALK PHOS: 93 U/L (ref 39–117)
ALT: 37 U/L (ref 0–53)
AST: 44 U/L — ABNORMAL HIGH (ref 0–37)
Anion gap: 21 — ABNORMAL HIGH (ref 5–15)
BILIRUBIN TOTAL: 0.4 mg/dL (ref 0.3–1.2)
BUN: 14 mg/dL (ref 6–23)
CHLORIDE: 93 meq/L — AB (ref 96–112)
CO2: 22 meq/L (ref 19–32)
Calcium: 8.8 mg/dL (ref 8.4–10.5)
Creatinine, Ser: 0.84 mg/dL (ref 0.50–1.35)
GFR calc Af Amer: >90 mL/min (ref >90)
Glucose, Bld: 82 mg/dL (ref 70–99)
Potassium: 3.8 mEq/L (ref 3.7–5.3)
Sodium: 136 mEq/L — ABNORMAL LOW (ref 137–147)
Total Protein: 7.8 g/dL (ref 6.0–8.3)

## 2014-06-25 LAB — ACETAMINOPHEN LEVEL: Acetaminophen (Tylenol), Serum: <15 ug/mL (ref 10–30)

## 2014-06-25 LAB — CBC WITH DIFFERENTIAL/PLATELET
Basophils Absolute: 0 10*3/uL (ref 0.0–0.1)
Basophils Relative: 0 % (ref 0–1)
Eosinophils Absolute: 0.1 10*3/uL (ref 0.0–0.7)
Eosinophils Relative: 1 % (ref 0–5)
HEMATOCRIT: 43.6 % (ref 39.0–52.0)
HEMOGLOBIN: 15.1 g/dL (ref 13.0–17.0)
LYMPHS PCT: 26 % (ref 12–46)
Lymphs Abs: 1.6 10*3/uL (ref 0.7–4.0)
MCH: 31.1 pg (ref 26.0–34.0)
MCHC: 34.6 g/dL (ref 30.0–36.0)
MCV: 89.7 fL (ref 78.0–100.0)
MONOS PCT: 6 % (ref 3–12)
Monocytes Absolute: 0.4 10*3/uL (ref 0.1–1.0)
NEUTROS ABS: 4.2 10*3/uL (ref 1.7–7.7)
NEUTROS PCT: 67 % (ref 43–77)
Platelets: 208 10*3/uL (ref 150–400)
RBC: 4.86 MIL/uL (ref 4.22–5.81)
RDW: 12.6 % (ref 11.5–15.5)
WBC: 6.3 10*3/uL (ref 4.0–10.5)

## 2014-06-25 LAB — ETHANOL: Alcohol, Ethyl (B): 127 mg/dL — ABNORMAL HIGH (ref 0–11)

## 2014-06-25 MED ORDER — MAGNESIUM HYDROXIDE 400 MG/5ML PO SUSP: 30.0000 mL | Freq: Every day | ORAL | Status: DC | PRN

## 2014-06-25 MED ORDER — LORAZEPAM 1 MG PO TABS: 0.0000 mg | ORAL_TABLET | Freq: Two times a day (BID) | ORAL | Status: DC

## 2014-06-25 MED ORDER — VITAMIN B-1 100 MG PO TABS
100.0000 mg | ORAL_TABLET | Freq: Once | ORAL | Status: DC
  Administered 2014-06-25: 100 mg via ORAL
  Filled 2014-06-25: qty 1

## 2014-06-25 MED ORDER — VITAMIN B-1 100 MG PO TABS: 250.0000 mg | ORAL_TABLET | Freq: Once | ORAL | Status: DC

## 2014-06-25 MED ORDER — LORAZEPAM 1 MG PO TABS
0.0000 mg | ORAL_TABLET | Freq: Four times a day (QID) | ORAL | Status: DC
  Administered 2014-06-25 (×2): 1 mg via ORAL
  Filled 2014-06-25 (×2): qty 1

## 2014-06-25 MED ORDER — ACETAMINOPHEN 325 MG PO TABS: 650.0000 mg | ORAL_TABLET | Freq: Four times a day (QID) | ORAL | Status: DC | PRN

## 2014-06-25 MED ORDER — LORAZEPAM 1 MG PO TABS
0.0000 mg | ORAL_TABLET | Freq: Four times a day (QID) | ORAL | Status: AC
  Administered 2014-06-25 – 2014-06-26 (×4): 1 mg via ORAL
  Filled 2014-06-25 (×4): qty 1

## 2014-06-25 MED ORDER — SODIUM CHLORIDE 0.9 % IV BOLUS (SEPSIS): 1000.0000 mL | Freq: Once | INTRAVENOUS | Status: DC

## 2014-06-25 MED ORDER — ADULT MULTIVITAMIN W/MINERALS CH
1.0000 | ORAL_TABLET | Freq: Once | ORAL | Status: DC
  Administered 2014-06-25: 1 via ORAL
  Filled 2014-06-25: qty 1

## 2014-06-25 MED ORDER — ADULT MULTIVITAMIN W/MINERALS CH
1.0000 | ORAL_TABLET | Freq: Once | ORAL | Status: AC
  Filled 2014-06-25: qty 1

## 2014-06-25 MED ORDER — ALUM & MAG HYDROXIDE-SIMETH 200-200-20 MG/5ML PO SUSP: 30.0000 mL | ORAL | Status: DC | PRN

## 2014-06-25 MED ORDER — VITAMIN B-1 100 MG PO TABS
100.0000 mg | ORAL_TABLET | Freq: Once | ORAL | Status: AC
  Filled 2014-06-25: qty 1

## 2014-06-25 NOTE — ED Notes (Signed)
ETOH and wants help with this last drink was 6pm last night drinks daily liquor. Has had treatment in the past  After a hip operation a few months ago and 8 months out of work he began drinking again.

## 2014-06-25 NOTE — Tx Team (Signed)
Initial Interdisciplinary Treatment Plan   PATIENT STRESSORS: Health problems Substance abuse   PROBLEM LIST: Problem List/Patient Goals Date to be addressed Date deferred Reason deferred Estimated date of resolution  Substance abuse 06/25/2014                                                      DISCHARGE CRITERIA:  Improved stabilization in mood, thinking, and/or behavior Need for constant or close observation no longer present Withdrawal symptoms are absent or subacute and managed without 24-hour nursing intervention  PRELIMINARY DISCHARGE PLAN: Outpatient therapy Return to previous living arrangement Return to previous work or school arrangements  PATIENT/FAMIILY INVOLVEMENT: This treatment plan has been presented to and reviewed with the patient, [name].  The patient and family have been given the opportunity to ask questions and make suggestions.  Leighton Parody M 06/25/2014, 6:05 PM

## 2014-06-25 NOTE — Consult Note (Signed)
[facility] Face-to-Face Psychiatry Consult   Reason for Consult:  Alcohol detox Referring Physician:  EDP  [name] is an 53 y.o. male. Total Time spent with patient: 20 minutes  Assessment: AXIS I:  Alcohol Abuse AXIS II:  Deferred AXIS III:   Past Medical History  Diagnosis Date  . Alcohol abuse    AXIS IV:  other psychosocial or environmental problems, problems related to social environment and problems with primary support group AXIS V:  21-30 behavior considerably influenced by delusions or hallucinations OR serious impairment in judgment, communication OR inability to function in almost all areas  Plan:  Recommend psychiatric Inpatient admission when medically cleared. Dr. Elsie Saas assessed the patient and concurs with the plan.  Subjective:   [name] is a 53 y.o. male patient admitted with alcohol detox/dependence.  HPI:  Patient has had a history of alcohol abuse for 30 years.  He was sober for six months prior to his hip surgery in October, 2014.  [name] said he started drinking again because it was the only thing that helped his pain.  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  Currently, he drinks a pint or two daily.  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 HPI Elements:   Location:  generalized. Quality:  acute. Severity:  severe. Timing:  constant. Duration:  9 months. Context:  stressors.  Past Psychiatric History: Past Medical History  Diagnosis Date  . Alcohol abuse     reports that he has been smoking Cigarettes.  He has a 1 pack-year smoking history. He has never used smokeless tobacco. He reports that he drinks alcohol. He reports that he does not use illicit drugs. Family History  Problem Relation Age of Onset  . Colon cancer Brother   . Liver cancer Brother   . Diabetes Paternal Uncle    . Stroke Neg Hx   . Learning disabilities Neg Hx   . Hypertension Neg Hx   . Heart disease Neg Hx            Allergies:  No Known Allergies  ACT Assessment Complete:  Yes:    Educational Status    Risk to Self: Risk to self with the past 6 months Is patient at risk for suicide?: No Substance abuse history and/or treatment for substance abuse?: Yes  Risk to Others:    Abuse:    Prior Inpatient Therapy:    Prior Outpatient Therapy:    Additional Information:                    Objective: Blood pressure 129/86, pulse 119, temperature 97.3 F (36.3 C), temperature source Oral, resp. rate 18, SpO2 100.00%.There is no weight on file to calculate BMI. Results for orders placed during the hospital encounter of 06/25/14 (from the past 72 hour(s))  CBC WITH DIFFERENTIAL     Status: None   Collection Time    06/25/14  8:16 AM      Result Value Ref Range   WBC 6.3  4.0 - 10.5 K/uL   RBC 4.86  4.22 - 5.81 MIL/uL   Hemoglobin 15.1  13.0 - 17.0 g/dL   HCT 16.1  09.6 - 04.5 %   MCV 89.7  78.0 - 100.0 fL   MCH 31.1  26.0 - 34.0 pg   MCHC 34.6  30.0 - 36.0 g/dL   RDW 16.112.6  09.611.5 - 04.515.5 %   Platelets 208  150 - 400 K/uL   Neutrophils Relative % 67  43 - 77 %   Neutro Abs 4.2  1.7 - 7.7 K/uL   Lymphocytes Relative 26  12 - 46 %   Lymphs Abs 1.6  0.7 - 4.0 K/uL   Monocytes Relative 6  3 - 12 %   Monocytes Absolute 0.4  0.1 - 1.0 K/uL   Eosinophils Relative 1  0 - 5 %   Eosinophils Absolute 0.1  0.0 - 0.7 K/uL   Basophils Relative 0  0 - 1 %   Basophils Absolute 0.0  0.0 - 0.1 K/uL   Labs are reviewed and are pertinent for no medical issues noted.  Current Facility-Administered Medications  Medication Dose Route Frequency Provider Last Rate Last Dose  . LORazepam (ATIVAN) tablet 0-4 mg  0-4 mg Oral 4 times per day Junius ArgyleForrest S Harrison, MD   1 mg at 06/25/14 0836   Followed by  . [START ON 06/27/2014] LORazepam (ATIVAN) tablet 0-4 mg  0-4 mg Oral Q12H Junius ArgyleForrest S Harrison,  MD      . sodium chloride 0.9 % bolus 1,000 mL  1,000 mL Intravenous Once Junius ArgyleForrest S Harrison, MD       No current outpatient prescriptions on file.    Psychiatric Specialty Exam:     Blood pressure 129/86, pulse 119, temperature 97.3 F (36.3 C), temperature source Oral, resp. rate 18, SpO2 100.00%.There is no weight on file to calculate BMI.  General Appearance: Casual  Eye Contact::  Fair  Speech:  Normal Rate  Volume:  Normal  Mood:  Depressed  Affect:  Congruent  Thought Process:  Coherent  Orientation:  Full (Time, Place, and Person)  Thought Content:  WDL  Suicidal Thoughts:  No  Homicidal Thoughts:  No  Memory:  Immediate;   Fair Recent;   Fair Remote;   Fair  Judgement:  Poor  Insight:  Fair  Psychomotor Activity:  Decreased  Concentration:  Fair  Recall:  [employer]Fair  Fund of Knowledge:Fair  Language: Good  Akathisia:  No  Handed:  Right  AIMS (if indicated):     Assets:  Desire for Improvement Housing Leisure Time Physical Health Resilience Social Support  Sleep:      Musculoskeletal: Strength & Muscle Tone: within normal limits Gait & Station: normal Patient leans: N/A  Treatment Plan Summary: Daily contact with patient to assess and evaluate symptoms and progress in treatment Medication management; CIWA alcohol detox started, admit to inpatient psychiatry for detox.  [name], [name], PMH-NP 06/25/2014 9:17 AM  Patient is seen face to face with physician extender for psychiatric evaluation and formulated treatment plan. Reviewed the information documented and agree with the treatment plan.  Song Myre,JANARDHAHA R. 06/26/2014 9:24 AM

## 2014-06-25 NOTE — ED Provider Notes (Signed)
CSN: [id number]     Arrival date & time 06/25/14  4098 History   First MD Initiated Contact with Patient 06/25/14 [phone]     Chief Complaint  Patient presents with  . Alcohol Intoxication     (Consider location/radiation/quality/duration/timing/severity/associated sxs/prior Treatment) Patient is a 53 y.o. male presenting with intoxication. The history is provided by the patient.  Alcohol Intoxication This is a recurrent problem. The current episode started more than 1 week ago. The problem occurs daily. The problem has not changed since onset.Pertinent negatives include no chest pain, no abdominal pain, no headaches and no shortness of breath. Exacerbated by: not being able to work, depression. Nothing relieves the symptoms. He has tried nothing for the symptoms. The treatment provided no relief.    Past Medical History  Diagnosis Date  . Alcohol abuse    Past Surgical History  Procedure Laterality Date  . Appendectomy    . Hernia repair    . Total hip arthroplasty Left 10/15/2013    Procedure: LEFT TOTAL HIP ARTHROPLASTY;  Surgeon: Thera Flake., MD;  Location: [facility] OR;  Service: Orthopedics;  Laterality: Left;   Family History  Problem Relation Age of Onset  . Colon cancer Brother   . Liver cancer Brother   . Diabetes Paternal Uncle   . Stroke Neg Hx   . Learning disabilities Neg Hx   . Hypertension Neg Hx   . Heart disease Neg Hx    History  Substance Use Topics  . Smoking status: Current Every Day Smoker -- 0.25 packs/day for 4 years    Types: Cigarettes  . Smokeless tobacco: Never Used  . Alcohol Use: Yes    Review of Systems  Constitutional: Negative for fever.  HENT: Negative for drooling and rhinorrhea.   Eyes: Negative for pain.  Respiratory: Negative for cough and shortness of breath.   Cardiovascular: Negative for chest pain and leg swelling.  Gastrointestinal: Negative for nausea, vomiting, abdominal pain and diarrhea.  Genitourinary: Negative for dysuria  and hematuria.  Musculoskeletal: Negative for gait problem and neck pain.  Skin: Negative for color change.  Neurological: Negative for numbness and headaches.  Hematological: Negative for adenopathy.  Psychiatric/Behavioral: Negative for behavioral problems.  All other systems reviewed and are negative.     Allergies  Review of patient's allergies indicates no known allergies.  Home Medications   Prior to Admission medications   Not on File   BP 129/86  Pulse 119  Temp(Src) 97.3 F (36.3 C) (Oral)  Resp 18  SpO2 100% Physical Exam  Nursing note and vitals reviewed. Constitutional: He is oriented to person, place, and time. He appears well-developed and well-nourished.  HENT:  Head: Normocephalic and atraumatic.  Right Ear: External ear normal.  Left Ear: External ear normal.  Nose: Nose normal.  Mouth/Throat: Oropharynx is clear and moist. No oropharyngeal exudate.  Eyes: Conjunctivae and EOM are normal. Pupils are equal, round, and reactive to light.  Neck: Normal range of motion. Neck supple.  Cardiovascular: Normal rate, regular rhythm, normal heart sounds and intact distal pulses.  Exam reveals no gallop and no friction rub.   No murmur heard. Pulmonary/Chest: Effort normal and breath sounds normal. No respiratory distress. He has no wheezes.  Abdominal: Soft. Bowel sounds are normal. He exhibits no distension. There is no tenderness. There is no rebound and no guarding.  Musculoskeletal: Normal range of motion. He exhibits no edema and no tenderness.  Neurological: He is alert and oriented to person, place,  and time.  Mild tremulousness.   Skin: Skin is warm and dry.  Psychiatric: He has a normal mood and affect. His behavior is normal.    ED Course  Procedures (including critical care time) Labs Review Labs Reviewed  COMPREHENSIVE METABOLIC PANEL - Abnormal; Notable for the following:    Sodium 136 (*)    Chloride 93 (*)    AST 44 (*)    Anion gap 21 (*)     All other components within normal limits  ETHANOL - Abnormal; Notable for the following:    Alcohol, Ethyl (B) 127 (*)    All other components within normal limits  SALICYLATE LEVEL - Abnormal; Notable for the following:    Salicylate Lvl <2.0 (*)    All other components within normal limits  CBC WITH DIFFERENTIAL  URINE RAPID DRUG SCREEN (HOSP PERFORMED)  ACETAMINOPHEN LEVEL    Imaging Review No results found.   EKG Interpretation None      MDM   Final diagnoses:  Alcohol dependence with withdrawal, uncomplicated    7:47 AM 53 y.o. male with a history of EtOH abuse presents with progress for detox. He states that he had a left hip surgery last November. Since that time he has had ongoing pain and has been unable to work and has started drinking again. He states that prior to the surgery he been sober for 6 months. He notes that he is currently drinking a half pint of liquor daily. He last had a drink yesterday afternoon. He denies any drug use or attempting to hurt himself. He does state that he occasionally has suicidal ideations but has no specific plan. He is not actively suicidal. He has had worsening depression recently. He is afebrile and mildly tachycardic here. He states that he has not had solid food intake in 4 days. Will get screening labs and consult TTS.   Pt medically cleared. TTS recommends inpt psych for detox.   Junius ArgyleForrest S Cailey Trigueros, MD 06/25/14 [phone]

## 2014-06-25 NOTE — Progress Notes (Signed)
D: Patient denies SI/HI and A/V hallucinations; patient is denying pain; patient denying withdrawal symptoms  A: Monitored q 15 minutes; patient encouraged to attend groups; patient educated about medications; patient given medications per physician orders; patient encouraged to express feelings and/or concerns  R: Patient is experiencing some tremors,

## 2014-06-25 NOTE — Progress Notes (Signed)
Assumed care of pt at 2300.  Pt resting in bed with eyes closed.  Respirations even, unlabored, WNL.  No distress noted.  Will continue to monitor for safety.

## 2014-06-26 ENCOUNTER — Encounter (HOSPITAL_COMMUNITY): Payer: Self-pay | Admitting: Psychiatry

## 2014-06-26 DIAGNOSIS — F1020 Alcohol dependence, uncomplicated: Secondary | ICD-10-CM

## 2014-06-26 MED ORDER — GABAPENTIN 300 MG PO CAPS
300.0000 mg | ORAL_CAPSULE | Freq: Three times a day (TID) | ORAL | Status: DC
  Administered 2014-06-26 – 2014-06-27 (×4): 300 mg via ORAL
  Filled 2014-06-26 (×8): qty 1
  Filled 2014-06-26 (×2): qty 12
  Filled 2014-06-26 (×2): qty 1
  Filled 2014-06-26: qty 12

## 2014-06-26 MED ORDER — LISINOPRIL 20 MG PO TABS
20.0000 mg | ORAL_TABLET | Freq: Once | ORAL | Status: AC
  Administered 2014-06-26: 20 mg via ORAL
  Filled 2014-06-26 (×2): qty 1

## 2014-06-26 MED ORDER — METHOCARBAMOL 500 MG PO TABS
500.0000 mg | ORAL_TABLET | Freq: Three times a day (TID) | ORAL | Status: DC
  Administered 2014-06-26 – 2014-06-27 (×4): 500 mg via ORAL
  Filled 2014-06-26 (×13): qty 1

## 2014-06-26 MED ORDER — LISINOPRIL 10 MG PO TABS
10.0000 mg | ORAL_TABLET | Freq: Every day | ORAL | Status: DC
  Administered 2014-06-27 – 2014-06-28 (×2): 10 mg via ORAL
  Filled 2014-06-26: qty 1
  Filled 2014-06-26: qty 4
  Filled 2014-06-26 (×2): qty 1
  Filled 2014-06-26: qty 2

## 2014-06-26 NOTE — Progress Notes (Signed)
Patient did attend the evening speaker AA meeting.  

## 2014-06-26 NOTE — Progress Notes (Signed)
[facility] Group Notes:  (Nursing/MHT/Case Management/Adjunct)  Date:  06/26/2014  Time:  5:03 PM  Type of Therapy:  Psychoeducational Skills  Participation Level:  Active  Participation Quality:  Appropriate and Attentive  Affect:  Appropriate  Cognitive:  Appropriate  Insight:  Appropriate  Engagement in Group:  Engaged  Modes of Intervention:  Activity  Summary of Progress/Problems: Pts played an activity using the Therapeutic Ball. Pts threw the ball around and answered questions about their social life and changes they would like to make while here in the hospital.  Caswell CorwinOwen, Krish Bailly C 06/26/2014, 5:03 PM

## 2014-06-26 NOTE — H&P (Signed)
Psychiatric Admission Assessment Adult  Patient Identification:  [name]  Date of Evaluation:  06/26/2014  Chief Complaint:  ETOH DEPENDENCE  History of Present Illness: [name] is a 53 year old African-American male. Admitted from the [facility] He reports, "The ambulance took me to the hospital yesterday. My wife had called the cops on me. She told the cops that I needed to talk to someone. I had a hip replacement last October. The pain associated with my bad hip has been bothering me a lot. I'm no longer able to help around the house like I used to because I'm in pain all the time. I drink Whisky about 3-4 shots three times a week just to help with my pain. But, my wife thinks my drinking is too much and that I have changed a lot. I'm not depressed. There is really not anything going on with me. Alcohol relaxes me too. 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 I was charged with a DUI several months ago. The court date is on 07/29/14. I'm not suicidal".  O: [name] Uses a cane and or wheel chair to aid his mobility.  Elements:  Location:  Alcoholism, . Quality:  Says he drinks 3- shots of whisky 3 times weekly. Severity:  Severe. Timing:  Been drinking heavily since November, 2014. Duration:  Chronic. Context:  "I had left hip surgery last October, been in a lot of pain. Alcohol helps my pain. That is why I drink".  Associated Signs/Synptoms:  Depression Symptoms:  feelings of worthlessness/guilt,  (Hypo) Manic Symptoms:  Denies  Anxiety Symptoms:  Excessive Worry,  Psychotic Symptoms:  Denies  PTSD Symptoms: NA  Psychiatric Specialty Exam: Physical Exam  Constitutional: He is oriented to person, place, and time. He appears well-developed.  HENT:  Head: Normocephalic.  Eyes: Pupils are equal, round, and reactive to light.  Neck: Normal range of motion.  Cardiovascular: Normal rate.   Respiratory: Effort normal.  GI: Soft.  Genitourinary:  Denies   Musculoskeletal:  Walks with a cane  Neurological: He is alert and oriented to person, place, and time.  Skin: Skin is warm and dry.    Review of Systems  Constitutional: Positive for chills, malaise/fatigue and diaphoresis.  HENT: Negative.   Eyes: Negative.   Respiratory: Negative.   Cardiovascular: Negative.   Gastrointestinal: Positive for nausea.  Genitourinary: Negative.   Musculoskeletal: Positive for joint pain and myalgias.       Walks with a cane   Skin: Negative.   Neurological: Positive for dizziness, tremors and weakness.  Endo/Heme/Allergies: Negative.   Psychiatric/Behavioral: Positive for substance abuse (Alcoholism, chronic). Negative for depression, suicidal ideas, hallucinations and memory loss. The patient is nervous/anxious and has insomnia.     Blood pressure 118/90, pulse 127, temperature 97.6 F (36.4 C), temperature source Oral, resp. rate 16, height 5' 8"  (3.790 m), weight 86.183 kg (190 lb), SpO2 99.00%.Body mass index is 28.9 kg/(m^2).  General Appearance: Casual  Eye Contact::  Good  Speech:  Clear and Coherent  Volume:  Normal  Mood:  Denies depression  Affect:  Congruent  Thought Process:  Coherent and Logical  Orientation:  Full (Time, Place, and Person)  Thought Content:  Rumination  Suicidal Thoughts:  No  Homicidal Thoughts:  No  Memory:  Immediate;   Good Recent;   Fair Remote;   Fair  Judgement:  Impaired  Insight:  Lacking  Psychomotor Activity:  Normal  Concentration:  Good  Recall:  Good  Fund of Knowledge:Poor  Language: Fair  Akathisia:  No  Handed:  Right  AIMS (if indicated):     Assets:  Desire for Improvement  Sleep:  Number of Hours: 6.5   Musculoskeletal: Strength & Muscle Tone: within normal limits Gait & Station: normal, unsteady, uses cane/wheel chair Patient leans: N/A  Past Psychiatric History: Diagnosis: Alcohol dependence  Hospitalizations: [facility] adult unit  Outpatient Care: With Dr. Kandyce Rud  Substance  Abuse Care:[facility], 1 year ago  Self-Mutilation: Denies  Suicidal Attempts: Denies  Violent Behaviors: Denies   Past Medical History:   Past Medical History  Diagnosis Date  . Alcohol abuse    None.  Allergies:  No Known Allergies  PTA Medications: No prescriptions prior to admission   Previous Psychotropic Medications: Medication/Dose  See medication lists               Substance Abuse History in the last 12 months:  Yes.    Consequences of Substance Abuse: Medical Consequences:  Liver damage, Possible death by overdose Legal Consequences:  Arrests, jail time, Loss of driving privilege. Family Consequences:  Family discord, divorce and or separation.  Social History:  reports that he has been smoking Cigarettes.  He has a 1 pack-year smoking history. He has never used smokeless tobacco. He reports that he drinks alcohol. He reports that he does not use illicit drugs.  Additional Social History: Current Place of Residence: [place], [place] of Birth: [place], [place]   Family Members: "My wife"  Marital Status:  Married  Children: 0  Sons:  Daughters:  Relationships: Married  Education:  No high school diploma  Educational Problems/Performance: Did not complete high school  Religious Beliefs/Practices: NA  History of Abuse (Emotional/Phsycial/Sexual): Denies  Occupational Experiences: Medical laboratory scientific officer History:  None.  Legal History: Pending DUI charge, court date 07/29/14  Hobbies/Interests: NA  Family History:   Family History  Problem Relation Age of Onset  . Colon cancer Brother   . Liver cancer Brother   . Diabetes Paternal Uncle   . Stroke Neg Hx   . Learning disabilities Neg Hx   . Hypertension Neg Hx   . Heart disease Neg Hx     Results for orders placed during the hospital encounter of 06/25/14 (from the past 72 hour(s))  CBC WITH DIFFERENTIAL     Status: None   Collection Time    06/25/14  8:16 AM       Result Value Ref Range   WBC 6.3  4.0 - 10.5 K/uL   RBC 4.86  4.22 - 5.81 MIL/uL   Hemoglobin 15.1  13.0 - 17.0 g/dL   HCT 43.6  39.0 - 52.0 %   MCV 89.7  78.0 - 100.0 fL   MCH 31.1  26.0 - 34.0 pg   MCHC 34.6  30.0 - 36.0 g/dL   RDW 12.6  11.5 - 15.5 %   Platelets 208  150 - 400 K/uL   Neutrophils Relative % 67  43 - 77 %   Neutro Abs 4.2  1.7 - 7.7 K/uL   Lymphocytes Relative 26  12 - 46 %   Lymphs Abs 1.6  0.7 - 4.0 K/uL   Monocytes Relative 6  3 - 12 %   Monocytes Absolute 0.4  0.1 - 1.0 K/uL   Eosinophils Relative 1  0 - 5 %   Eosinophils Absolute 0.1  0.0 - 0.7 K/uL   Basophils Relative 0  0 - 1 %  Basophils Absolute 0.0  0.0 - 0.1 K/uL  COMPREHENSIVE METABOLIC PANEL     Status: Abnormal   Collection Time    06/25/14  8:16 AM      Result Value Ref Range   Sodium 136 (*) 137 - 147 mEq/L   Potassium 3.8  3.7 - 5.3 mEq/L   Chloride 93 (*) 96 - 112 mEq/L   CO2 22  19 - 32 mEq/L   Glucose, Bld 82  70 - 99 mg/dL   BUN 14  6 - 23 mg/dL   Creatinine, Ser 0.84  0.50 - 1.35 mg/dL   Calcium 8.8  8.4 - 10.5 mg/dL   Total Protein 7.8  6.0 - 8.3 g/dL   Albumin 4.2  3.5 - 5.2 g/dL   AST 44 (*) 0 - 37 U/L   Comment: SLIGHT HEMOLYSIS     HEMOLYSIS AT THIS LEVEL MAY AFFECT RESULT   ALT 37  0 - 53 U/L   Alkaline Phosphatase 93  39 - 117 U/L   Total Bilirubin 0.4  0.3 - 1.2 mg/dL   GFR calc non Af Amer >90  >90 mL/min   GFR calc Af Amer >90  >90 mL/min   Comment: (NOTE)     The eGFR has been calculated using the CKD EPI equation.     This calculation has not been validated in all clinical situations.     eGFR's persistently <90 mL/min signify possible Chronic Kidney     Disease.   Anion gap 21 (*) 5 - 15  ETHANOL     Status: Abnormal   Collection Time    06/25/14  8:16 AM      Result Value Ref Range   Alcohol, Ethyl (B) 127 (*) 0 - 11 mg/dL   Comment:            LOWEST DETECTABLE LIMIT FOR     SERUM ALCOHOL IS 11 mg/dL     FOR MEDICAL PURPOSES ONLY  ACETAMINOPHEN LEVEL      Status: None   Collection Time    06/25/14  8:16 AM      Result Value Ref Range   Acetaminophen (Tylenol), Serum <15.0  10 - 30 ug/mL   Comment:            THERAPEUTIC CONCENTRATIONS VARY     SIGNIFICANTLY. A RANGE OF 10-30     ug/mL MAY BE AN EFFECTIVE     CONCENTRATION FOR MANY PATIENTS.     HOWEVER, SOME ARE BEST TREATED     AT CONCENTRATIONS OUTSIDE THIS     RANGE.     ACETAMINOPHEN CONCENTRATIONS     >150 ug/mL AT 4 HOURS AFTER     INGESTION AND >50 ug/mL AT 12     HOURS AFTER INGESTION ARE     OFTEN ASSOCIATED WITH TOXIC     REACTIONS.  SALICYLATE LEVEL     Status: Abnormal   Collection Time    06/25/14  8:16 AM      Result Value Ref Range   Salicylate Lvl <6.7 (*) 2.8 - 20.0 mg/dL  URINE RAPID DRUG SCREEN (HOSP PERFORMED)     Status: None   Collection Time    06/25/14  9:04 AM      Result Value Ref Range   Opiates NONE DETECTED  NONE DETECTED   Cocaine NONE DETECTED  NONE DETECTED   Benzodiazepines NONE DETECTED  NONE DETECTED   Amphetamines NONE DETECTED  NONE DETECTED   Tetrahydrocannabinol NONE DETECTED  NONE DETECTED   Barbiturates NONE DETECTED  NONE DETECTED   Comment:            DRUG SCREEN FOR MEDICAL PURPOSES     ONLY.  IF CONFIRMATION IS NEEDED     FOR ANY PURPOSE, NOTIFY LAB     WITHIN 5 DAYS.                LOWEST DETECTABLE LIMITS     FOR URINE DRUG SCREEN     Drug Class       Cutoff (ng/mL)     Amphetamine      1000     Barbiturate      200     Benzodiazepine   197     Tricyclics       588     Opiates          300     Cocaine          300     THC              50   Psychological Evaluations:  Assessment:   DSM5: Schizophrenia Disorders:  NA Obsessive-Compulsive Disorders:  NA Trauma-Stressor Disorders:  NA Substance/Addictive Disorders:  Alcohol Related Disorder - Severe (303.90) Depressive Disorders:  NA  AXIS I:  Alcohol dependence AXIS II:  Deferred AXIS III:   Past Medical History  Diagnosis Date  . Alcohol abuse    AXIS IV:   other psychosocial or environmental problems and Alcoholism, chronic AXIS V:  1-10 persistent dangerousness to self and others present  Treatment Plan/Recommendations: 1. Admit for crisis management and stabilization, estimated length of stay 3-5 days.  2. Medication management to reduce current symptoms to base line and improve the patient's overall level of functioning; Continue Ativan detox regimen for alcohol detox.  3. Treat health problems as indicated, initiate Lisinopril 20 mg once, and 10 mg daily for hypertension, Robaxin 500 mg three times daily x 4 days for muscle pain, Neurontin 300 mg three times daily for painss/substance withdrawal syndrome.. 4. Develop treatment plan to decrease risk of relapse upon discharge and the need for readmission.  5. Psycho-social education regarding relapse prevention and self care.  6. Health care follow up as needed for medical problems.  7. Review, reconcile, and reinstate any pertinent home medications for other health issues where appropriate. 8. Call for consults with hospitalist for any additional specialty patient care services as needed.  Treatment Plan Summary: Daily contact with patient to assess and evaluate symptoms and progress in treatment Medication management  Current Medications:  Current Facility-Administered Medications  Medication Dose Route Frequency Provider Last Rate Last Dose  . acetaminophen (TYLENOL) tablet 650 mg  650 mg Oral Q6H PRN Waylan Boga, NP      . alum & mag hydroxide-simeth (MAALOX/MYLANTA) 200-200-20 MG/5ML suspension 30 mL  30 mL Oral Q4H PRN Waylan Boga, NP      . LORazepam (ATIVAN) tablet 0-4 mg  0-4 mg Oral 4 times per day Waylan Boga, NP   1 mg at 06/26/14 3254   Followed by  . [START ON 06/27/2014] LORazepam (ATIVAN) tablet 0-4 mg  0-4 mg Oral Q12H Waylan Boga, NP      . magnesium hydroxide (MILK OF MAGNESIA) suspension 30 mL  30 mL Oral Daily PRN Waylan Boga, NP        Observation  Level/Precautions:  15 minute checks  Laboratory:  Per ED  Psychotherapy:  Group sessions  Medications:  See medication  lists  Consultations:  As needed  Discharge Concerns:  Maintaining sobriety  Estimated LOS: 2-4 days  Other:     I certify that inpatient services furnished can reasonably be expected to improve the patient's condition.   Lindell Spar I, PMHNp-BC 8/2/20159:17 AM   Patient seen, evaluated and I agree with notes by Nurse Practitioner. Corena Pilgrim, MD

## 2014-06-26 NOTE — [facility] Group Notes (Signed)
[facility] Group Notes:  (Nursing/MHT/Case Management/Adjunct)  Date:  06/26/2014  Time:  4:19 PM  Type of Therapy:  Nurse Education  Participation Level:  Active  Participation Quality:  Appropriate, Sharing and Supportive  Affect:  Appropriate  Cognitive:  Alert and Appropriate  Insight:  Good  Engagement in Group:  Engaged and Supportive  Modes of Intervention:  Activity, Discussion and Education  Summary of Progress/Problems:pt stated,"my attitude when I am drinking is like an asshole to everyone else" he did share he was planning to take what he thinks he knows and add it to what he has learned.   Jule SerKent, Betty Daidone Gail 06/26/2014, 4:19 PM

## 2014-06-26 NOTE — [facility] Group Notes (Signed)
[facility] Group Notes:  (Clinical Social Work)  06/26/2014  10:00-11:00AM  Summary of Progress/Problems:   The main focus of today's process group was to   identify the patient's current support system and decide on other supports that can be put in place.  The picture on workbook was used to discuss why additional supports are needed.  An emphasis was placed on using counselor, doctor, therapy groups, 12-step groups, and problem-specific support groups to expand supports.   There was also an extensive discussion about what constitutes a healthy support versus an unhealthy support.  The patient arrived for the last 15 minutes of group, listened only.  Type of Therapy:  Process Group with Motivational Interviewing  Participation Level:  Minimal  Participation Quality:  Attentive  Affect:  Blunted and Depressed  Cognitive:  Appropriate and Oriented  Insight:  Developing/Improving  Engagement in Therapy:  Improving  Modes of Intervention:   Education, Support and Processing, Activity  Shawn MantleMareida Grossman-Orr, LCSW 06/26/2014, 12:15pm

## 2014-06-26 NOTE — Progress Notes (Signed)
Patient ID: [name], male   DOB: 04/30/1961, 53 y.o.   MRN: [id number] D: Patient has been up in the milieu attending groups and interacting with his peers.  Patient requested a wheelchair, as he was not ambulating well with his cane.  Patient has a recent left hip replacement which he states, "it did not go well and is not healing like it should." He denies any SI/HI/AVH.  He has good insight regarding his alcohol use.  He stated during group that his wife is his biggest support.  He also has an AA group and a sponsor.  He relapsed on alcohol after his hip surgery due to severe pain.  He denies any depressive symptoms or hopelessness.   A: Continue to monitor medication management and MD orders.  Safety checks completed every 15 minutes per protocol. R: Patient is pleasant and cooperative.  Encourage and support patient as needed.

## 2014-06-26 NOTE — [facility] Suicide Risk Assessment (Signed)
   Nursing information obtained from:  Patient Demographic factors:  Male;Low socioeconomic status;Unemployed (currently not working due to surgery on disability) Current Mental Status:  NA Loss Factors:  NA Historical Factors:  Family history of mental illness or substance abuse Risk Reduction Factors:  Sense of responsibility to family Total Time spent with patient: 30 minutes  CLINICAL FACTORS:   Alcohol/Substance Abuse/Dependencies  Psychiatric Specialty Exam: Physical Exam  Psychiatric: His speech is normal and behavior is normal. Judgment and thought content normal. His mood appears anxious. Cognition and memory are normal. He exhibits a depressed mood.    Review of Systems  Constitutional: Negative.   HENT: Negative.   Eyes: Negative.   Respiratory: Negative.   Cardiovascular: Negative.   Gastrointestinal: Positive for nausea.  Genitourinary: Negative.   Musculoskeletal: Positive for joint pain.  Skin: Negative.   Neurological: Positive for tremors.  Endo/Heme/Allergies: Negative.   Psychiatric/Behavioral: Positive for substance abuse.    Blood pressure 118/90, pulse 127, temperature 97.6 F (36.4 C), temperature source Oral, resp. rate 16, height 5\' 8"  (1.727 m), weight 86.183 kg (190 lb), SpO2 99.00%.Body mass index is 28.9 kg/(m^2).  General Appearance: Fairly Groomed  Patent attorneyye Contact::  Good  Speech:  Clear and Coherent  Volume:  Normal  Mood:  Anxious  Affect:  Constricted  Thought Process:  Goal Directed  Orientation:  Full (Time, Place, and Person)  Thought Content:  Negative  Suicidal Thoughts:  No  Homicidal Thoughts:  No  Memory:  Immediate;   Fair Recent;   Fair Remote;   Fair  Judgement:  Impaired  Insight:  Shallow  Psychomotor Activity:  Normal  Concentration:  Fair  Recall:  FiservFair  Fund of Knowledge:Good  Language: Fair  Akathisia:  No  Handed:    AIMS (if indicated):     Assets:  Communication Skills Desire for Improvement  Sleep:  Number of  Hours: 6.5   Musculoskeletal: Strength & Muscle Tone: decreased Gait & Station: unsteady Patient leans: using walking stick  COGNITIVE FEATURES THAT CONTRIBUTE TO RISK:  Closed-mindedness    SUICIDE RISK:   Minimal: No identifiable suicidal ideation.  Patients presenting with no risk factors but with morbid ruminations; may be classified as minimal risk based on the severity of the depressive symptoms  PLAN OF CARE:1. Admit for crisis management and stabilization. 2. Medication management to reduce current symptoms to base line and improve the     patient's overall level of functioning 3. Treat health problems as indicated. 4. Develop treatment plan to decrease risk of relapse upon discharge and the need for     readmission. 5. Psycho-social education regarding relapse prevention and self care. 6. Health care follow up as needed for medical problems. 7. Restart home medications where appropriate.   I certify that inpatient services furnished can reasonably be expected to improve the patient's condition.  Naly Schwanz,MD 06/26/2014, 9:32 AM

## 2014-06-26 NOTE — [facility] Group Notes (Signed)
[facility] Group Notes:  (Nursing/MHT/Case Management/Adjunct)  Date:  06/26/2014  Time:  0900 am  Type of Therapy:  Psychoeducational Skills  Participation Level:  Active  Participation Quality:  Appropriate and Attentive  Affect:  Appropriate  Cognitive:  Alert and Appropriate  Insight:  Appropriate  Engagement in Group:  Supportive  Modes of Intervention:  Support  Summary of Progress/Problems: Patient has good insight regarding his alcohol abuse.  Cranford MonBeaudry, Sallie Maker Evans 06/26/2014, 11:00 AM

## 2014-06-27 DIAGNOSIS — F4321 Adjustment disorder with depressed mood: Secondary | ICD-10-CM

## 2014-06-27 MED ORDER — DULOXETINE HCL 20 MG PO CPEP
20.0000 mg | ORAL_CAPSULE | Freq: Every day | ORAL | Status: DC
  Administered 2014-06-27: 20 mg via ORAL
  Filled 2014-06-27 (×3): qty 1
  Filled 2014-06-27: qty 4

## 2014-06-27 NOTE — Progress Notes (Signed)
Adult Psychoeducational Group Note  Date:  06/27/2014 Time:  10:00 am  Group Topic/Focus:  Wellness Toolbox:   The focus of this group is to discuss various aspects of wellness, balancing those aspects and exploring ways to increase the ability to experience wellness.  Patients will create a wellness toolbox for use upon discharge.  Participation Level:  Active  Participation Quality:  Appropriate, Sharing and Supportive  Affect:  Appropriate  Cognitive:  Appropriate  Insight: Appropriate  Engagement in Group:  Engaged  Modes of Intervention:  Discussion, Education, Socialization and Support  Additional Comments:  Pt stated that he needs to eat better and get medical attention to address issues that he is having with his hip after a replacement surgery. Pt stated that he has been overeating due to depression. Pt stated that his hip surgery did not go well and that he is in a great amount of pain.   Shriya Aker 06/27/2014, 1:43 PM

## 2014-06-27 NOTE — [facility] Group Notes (Signed)
[facility]  06/27/2014 8:45 AM  Participation Quality: Alert, Appropriate and Oriented  Mood/Affect: Appropriate affect; "good" mood  Depression Rating: 0  Anxiety Rating: 0  Thoughts of Suicide: Pt denies SI/HI  Will you contract for safety? Yes  Current AVH: Pt denies  Plan for Discharge/Comments: Pt attended discharge planning group and actively participated in group. CSW provided pt with today's workbook. Pt states, "I went to the hospital for hip pain because of my hip replacement surgery and somehow I ended up over here."  Pt reports that he was not drinking when he came to the hospital and that he "needs to get out of here and see my doctor." Pt reports that he does not see a psychiatrist or therapist outside of the hospital.   Transportation Means: Pt reports access to transportation  Supports: Wife, Kandis NabDonna  [name], LCSWA 06/27/2014 10:25 AM

## 2014-06-27 NOTE — Progress Notes (Signed)
[facility] MD Progress Note  06/27/2014 6:18 PM [name]  MRN:  [id number] Subjective:  Admits he drinks to deal with the pain. States he cant tolerate opioids. He admits to multiple losses coming from his inability to do the things he used to do before his hip replacement. He states he also deals with his wife being very sick He wants to get a second opinion to see if there is something that can be done about his hip pain Diagnosis:   DSM5: Substance/Addictive Disorders:  Alcohol Related Disorder - Moderate (303.90) Depressive Disorders:  Major Depressive Disorder - Moderate (296.22) Total Time spent with patient: 30 minutes  Axis I: Adjustment Disorder with Depressed Mood  ADL's:  Impaired  Sleep: Poor  Appetite:  Fair  SuPsychiatric Specialty Exam: Physical Exam  Review of Systems  Constitutional: Negative.   HENT: Negative.   Eyes: Negative.   Respiratory: Negative.   Cardiovascular: Negative.   Gastrointestinal: Negative.   Genitourinary: Negative.   Musculoskeletal: Positive for back pain and joint pain.  Skin: Negative.   Neurological: Negative.   Endo/Heme/Allergies: Negative.   Psychiatric/Behavioral: Positive for substance abuse. The patient is nervous/anxious.     Blood pressure 121/79, pulse 100, temperature 98.2 F (36.8 C), temperature source Oral, resp. rate 20, height 5\' 8"  (1.727 m), weight 86.183 kg (190 lb), SpO2 99.00%.Body mass index is 28.9 kg/(m^2).  General Appearance: Fairly Groomed  Patent attorney::  Fair  Speech:  Clear and Coherent  Volume:  Decreased  Mood:  Anxious, Depressed and in pain  Affect:  sad, anxious, in pain  Thought Process:  Coherent and Goal Directed  Orientation:  Full (Time, Place, and Person)  Thought Content:  symtpoms events worries concerns  Suicidal Thoughts:  No  Homicidal Thoughts:  No  Memory:  Immediate;   Fair Recent;   Fair Remote;   Fair  Judgement:  Fair  Insight:  Present  Psychomotor Activity:  Restlessness   Concentration:  Fair  Recall:  [employer] of Knowledge:NA  Language: Fair  Akathisia:  No  Handed:    AIMS (if indicated):     Assets:  Desire for Improvement  Sleep:  Number of Hours: 4.5   Musculoskeletal: Strength & Muscle Tone: decreased Gait & Station: unsteady Patient leans: N/A  Current Medications: Current Facility-Administered Medications  Medication Dose Route Frequency Provider Last Rate Last Dose  . acetaminophen (TYLENOL) tablet 650 mg  650 mg Oral Q6H PRN Nanine Means, NP      . alum & mag hydroxide-simeth (MAALOX/MYLANTA) 200-200-20 MG/5ML suspension 30 mL  30 mL Oral Q4H PRN Nanine Means, NP      . DULoxetine (CYMBALTA) DR capsule 20 mg  20 mg Oral QHS Rachael Fee, MD      . gabapentin (NEURONTIN) capsule 300 mg  300 mg Oral TID Sanjuana Kava, NP   300 mg at 06/27/14 1623  . lisinopril (PRINIVIL,ZESTRIL) tablet 10 mg  10 mg Oral Daily Sanjuana Kava, NP   10 mg at 06/27/14 4098  . LORazepam (ATIVAN) tablet 0-4 mg  0-4 mg Oral Q12H Nanine Means, NP      . magnesium hydroxide (MILK OF MAGNESIA) suspension 30 mL  30 mL Oral Daily PRN Nanine Means, NP      . methocarbamol (ROBAXIN) tablet 500 mg  500 mg Oral TID Sanjuana Kava, NP   500 mg at 06/27/14 1623    Lab Results: No results found for this or any previous visit (  from the past 48 hour(s)).  Physical Findings: AIMS:  , ,  ,  ,    CIWA:  CIWA-Ar Total: 0 COWS:     Treatment Plan Summary: Daily contact with patient to assess and evaluate symptoms and progress in treatment Medication management  Plan: Supportive approach/coping skills/relapse prevention           CBT/mindfulness           Will add Cymbalta to address the pain and the underlying mood issues (" I am not a pill person" but he is willing to give it a try)  Medical Decision Making Problem Points:  Review of psycho-social stressors (1) Data Points:  Review of new medications or change in dosage (2)  I certify that inpatient services furnished  can reasonably be expected to improve the patient's condition.   Sitlali Koerner A 06/27/2014, 6:18 PM

## 2014-06-27 NOTE — Progress Notes (Signed)
D: patient came to staff this morning stating, "I need to go home today. I have a household to keep up.  It was my wife that made me come here."  Patient continues the ativan protocol.  He has minimal withdrawal symptoms.  He is now ambulating with a cane instead of a wheelchair.  He denies any SI/Hi/AVH.  He is attending groups and participating.  His mood is anxious with sad affect. A: Continue to monitor medication management and MD orders.  Safety checks completed every 15 minutes per protocol.  R: Patient is pleasant and cooperative.
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
## 2014-06-27 NOTE — [facility] Group Notes (Signed)
[facility] LCSW Group Therapy  06/27/2014 1:15pm   Type of Therapy: Group Therapy- Overcoming Obstacles  Participation Level: Minimal  Participation Quality:  Appropriate  Affect:  Appropriate   Cognitive: Alert and Oriented   Insight:  Lacking   Engagement in Therapy: Developing/Improving and Engaged   Modes of Intervention: Clarification, Confrontation, Discussion, Education, Exploration, Limit-setting, Orientation, Problem-solving, Rapport Building, Dance movement psychotherapisteality Testing, Socialization and Support   Description of Group:   In this group patients will be encouraged to explore what they see as obstacles to their own wellness and recovery. They will be guided to discuss their thoughts, feelings, and behaviors related to these obstacles. The group will process together ways to cope with barriers, with attention given to specific choices patients can make. Each patient will be challenged to identify changes they are motivated to make in order to overcome their obstacles. This group will be process-oriented, with patients participating in exploration of their own experiences as well as giving and receiving support and challenge from other group members. Pt participated minimally in group discussion, but was able to identify his biggest obstacle as his inability to work due to his hip injury.  Pt reports that he would like to stop drinking, verbalizing that he will be able to "just stop."  Pt reports that not having a job negatively affects his self-esteem.  Pt has limited insight into his alcohol abuse and is often fixated on his hip.   Therapeutic Modalities:   Cognitive Behavioral Therapy Solution Focused Therapy Motivational Interviewing Relapse Prevention Therapy    Chad CordialLauren Carter, LCSWA 06/27/2014 4:20 PM

## 2014-06-27 NOTE — Progress Notes (Signed)
D: Pt presents with sad affect and mood.  Denies SI/HI.  Denies AH/VH.  No complaints of pain.  Pt ambulated with cane this shift.  Pt reports "I just want to know when I'm going to discharge.  I wasn't even drinking when I came in here.  My wife sent me and she was probably drunk when she did it.  My goal was to go to groups and I went to them."  Pt has no complaints of withdrawal symptoms. A: Encouragement and support offered.  Safety maintained.   R: Pt interacted appropriately with staff and peers.  Attended evening group.  Pt is in no acute distress.  Will continue to monitor for safety.

## 2014-06-28 DIAGNOSIS — F10939 Alcohol use, unspecified with withdrawal, unspecified: Principal | ICD-10-CM

## 2014-06-28 DIAGNOSIS — F10239 Alcohol dependence with withdrawal, unspecified: Principal | ICD-10-CM

## 2014-06-28 MED ORDER — GABAPENTIN 300 MG PO CAPS: 300.0000 mg | ORAL_CAPSULE | Freq: Three times a day (TID) | ORAL | Status: DC

## 2014-06-28 MED ORDER — DULOXETINE HCL 20 MG PO CPEP: 20.0000 mg | ORAL_CAPSULE | Freq: Every day | ORAL | Status: DC

## 2014-06-28 MED ORDER — LISINOPRIL 10 MG PO TABS: 10.0000 mg | ORAL_TABLET | Freq: Every day | ORAL | Status: DC
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
## 2014-06-28 NOTE — Tx Team (Signed)
Interdisciplinary Treatment Plan Update (Adult) Date: 06/28/2014   Time Reviewed: 12:51 PM   Progress in Treatment: Attending groups: Yes Participating in groups: Yes Taking medication as prescribed: Yes Tolerating medication: Yes Family/Significant other contact made: CSW assessing Patient understands diagnosis: Yes Discussing patient identified problems/goals with staff: Yes Medical problems stabilized or resolved: Yes Denies suicidal/homicidal ideation: Yes Issues/concerns per patient self-inventory: Yes Other:  New problem(s) identified: N/A  Discharge Plan or Barriers: Patient to return home with wife and [facility].   Reason for Continuation of Hospitalization: Anxiety Depression Medication Stabilization  Comments: N/A  Estimated length of stay: D/C TODAY  For review of initial/current patient goals, please see plan of care.  Attendees: Patient:    Family:    Physician: Dr. Dub MikesLugo 06/28/2014  12:51 PM  Nursing:  06/28/2014  12:51 PM   Clinical Social Worker: Belenda CruiseKristin Astra Gregg, LCSWA 06/28/2014  12:51 PM         Other: Serena ColonelAggie Nwoko, NP 06/28/2014  12:51 PM   Other:    Other:    Other:    Other:    Other:    Other:    Other:     Scribe for Treatment Team:  Samuella BruinKristin Dyanne Yorks, MSW, [employer]CSWA [phone]

## 2014-06-28 NOTE — Progress Notes (Signed)
[facility] Discharge Plan :  Will you be returning to the same living situation after discharge: Yes,  patient will return home with his wife At discharge, do you have transportation home?:Yes,  patient states that he will have a ride home Do you have the ability to pay for your medications:Yes,  patient will be provided with prescriptions at discharge and verbalizes his ability to obtain medications.  Release of information consent forms completed and in the chart;  Patient's signature needed at discharge.  Patient to Follow up at: Follow-up Information   [facility] On 07/22/2014. ([facility] on this date for appointment with Dr. Lolly MustacheArfeen. Appointment at 10 am, please arrive at 9 am with insurance card and new patient paperwork packet. Call number above if you need to reschedule appt. )    Contact information:   [address] [place], [place] [address] [phone]      Patient denies SI/HI:   Yes,  denies    Safety Planning and Suicide Prevention discussed:  Yes,  discussed with wife Claudius SisDonna  Chontel Warning L 06/28/2014, 11:04 AM

## 2014-06-28 NOTE — [facility] Group Notes (Signed)
[facility] Group Notes:  (Nursing/MHT/Case Management/Adjunct)  Date:  06/28/2014  Time:  0900 am  Type of Therapy:  Psychoeducational Skills  Participation Level:  Did Not Attend   Cranford MonBeaudry, Sahas Sluka Evans 06/28/2014, 1:56 PM

## 2014-06-28 NOTE — Progress Notes (Signed)
Patient ID: [name], male   DOB: 04-07-1961, 53 y.o.   MRN: [id number] Patient discharged home per MD order.  Patient received all personal belongings, prescriptions and medication samples.  Patient denies any SI/HI/AVH.  [facility]  Discharge instructions and medications reviewed with patient and he indicated understanding.  Patient left by wheelchair; he left with his wife.

## 2014-06-28 NOTE — [facility] Suicide Risk Assessment (Signed)
[facility] INPATIENT:  Family/Significant Other Suicide Prevention Education  Suicide Prevention Education:  Education Completed; [name] (wife) [phone],  (name of family member/significant other) has been identified by the patient as the family member/significant other with whom the patient will be residing, and identified as the person(s) who will aid the patient in the event of a mental health crisis (suicidal ideations/suicide attempt).  With written consent from the patient, the family member/significant other has been provided the following suicide prevention education, prior to the and/or following the discharge of the patient.  The suicide prevention education provided includes the following:  Suicide risk factors  Suicide prevention and interventions  National Suicide Hotline telephone number  [facility] assessment telephone number  [facility] Emergency Assistance 911  [facility] and/or Residential Mobile Crisis Unit telephone number  Request made of family/significant other to:  Remove weapons (e.g., guns, rifles, knives), all items previously/currently identified as safety concern.    Remove drugs/medications (over-the-counter, prescriptions, illicit drugs), all items previously/currently identified as a safety concern.  The family member/significant other verbalizes understanding of the suicide prevention education information provided.  The family member/significant other agrees to remove the items of safety concern listed above.  Taiquan Campanaro, [place] CarboKristin L 06/28/2014, 10:30 AM

## 2014-06-28 NOTE — [facility] Suicide Risk Assessment (Signed)
Suicide Risk Assessment  Discharge Assessment     Demographic Factors:  Male  Total Time spent with patient: 30 minutes  Psychiatric Specialty Exam:     Blood pressure 117/82, pulse 115, temperature 98.1 F (36.7 C), temperature source Oral, resp. rate 16, height 5\' 8"  (1.727 m), weight 86.183 kg (190 lb), SpO2 99.00%.Body mass index is 28.9 kg/(m^2).  General Appearance: Fairly Groomed  Patent attorneyye Contact::  Fair  Speech:  Clear and Coherent  Volume:  Decreased  Mood:  Anxious and in pain  Affect:  anxious in pain  Thought Process:  Coherent and Goal Directed  Orientation:  Full (Time, Place, and Person)  Thought Content:  plans as he moves on, his plans to get a second opinion about his pain  Suicidal Thoughts:  No  Homicidal Thoughts:  No  Memory:  Immediate;   Fair Recent;   Fair Remote;   Fair  Judgement:  Fair  Insight:  Present  Psychomotor Activity:  Restlessness  Concentration:  Fair  Recall:  [employer]Fair  Fund of Knowledge:NA  Language: Fair  Akathisia:  No  Handed:    AIMS (if indicated):     Assets:  Desire for Improvement  Sleep:  Number of Hours: 6.75    Musculoskeletal: Strength & Muscle Tone: within normal limits Gait & Station: normal Patient leans: N/A   Mental Status Per Nursing Assessment::   On Admission:  NA  Current Mental Status by Physician: in full contact with reality. No active S/S of withdrawal. There are no active SI ideas, plans or intent. He is committed to abstinence. He is planning to get a second opinion about his pain   Loss Factors: Decline in physical health  Historical Factors: NA  Risk Reduction Factors:   Sense of responsibility to family, Living with another person, especially a relative and Positive social support  Continued Clinical Symptoms:  Alcohol/Substance Abuse/Dependencies  Cognitive Features That Contribute To Risk:  Closed-mindedness Polarized thinking Thought constriction (tunnel vision)    Suicide Risk:   Minimal: No identifiable suicidal ideation.  Patients presenting with no risk factors but with morbid ruminations; may be classified as minimal risk based on the severity of the depressive symptoms  Discharge Diagnoses:   AXIS I:  Alcohol Dependence, S/P withdrawal, Adjustment Disorder with Depression AXIS II:  No diagnosis AXIS III:   Past Medical History  Diagnosis Date  . Alcohol abuse   . Anxiety    AXIS IV:  other psychosocial or environmental problems AXIS V:  61-70 mild symptoms  Plan Of Care/Follow-up recommendations:  Activity:  as tolerated Diet:  regular Follow up PCP Is patient on multiple antipsychotic therapies at discharge:  No   Has Patient had three or more failed trials of antipsychotic monotherapy by history:  No  Recommended Plan for Multiple Antipsychotic Therapies: NA    Lupe Bonner A 06/28/2014, 12:54 PM

## 2014-06-30 NOTE — Progress Notes (Signed)
Patient Discharge Instructions:  Next Level Care Provider Has Access to the EMR, 06/30/14 [facility] via [facility]/Epic access.  Jerelene ReddenSheena E [place], 06/30/2014, 3:28 PM

## 2014-07-19 ENCOUNTER — Telehealth: Payer: Self-pay | Admitting: Internal Medicine

## 2014-07-19 ENCOUNTER — Ambulatory Visit: Payer: Self-pay | Admitting: Internal Medicine

## 2014-07-19 NOTE — Telephone Encounter (Signed)
Ok to offer ROV prn

## 2014-07-19 NOTE — Telephone Encounter (Signed)
Patient no showed for acute on 8/25.  Please advise. °

## 2014-07-20 NOTE — Telephone Encounter (Signed)
Rescheduled for 08/28

## 2014-07-22 ENCOUNTER — Ambulatory Visit (HOSPITAL_COMMUNITY): Payer: Self-pay | Admitting: Psychiatry

## 2014-07-22 ENCOUNTER — Ambulatory Visit (INDEPENDENT_AMBULATORY_CARE_PROVIDER_SITE_OTHER): Payer: No Typology Code available for payment source | Admitting: Internal Medicine

## 2014-07-22 ENCOUNTER — Encounter: Payer: Self-pay | Admitting: Internal Medicine

## 2014-07-22 ENCOUNTER — Ambulatory Visit (INDEPENDENT_AMBULATORY_CARE_PROVIDER_SITE_OTHER): Payer: No Typology Code available for payment source

## 2014-07-22 VITALS — BP 138/88 | HR 104 | Temp 97.3°F | Wt 199.8 lb

## 2014-07-22 DIAGNOSIS — F419 Anxiety disorder, unspecified: Secondary | ICD-10-CM | POA: Insufficient documentation

## 2014-07-22 DIAGNOSIS — Z79899 Other long term (current) drug therapy: Secondary | ICD-10-CM | POA: Diagnosis not present

## 2014-07-22 DIAGNOSIS — M25551 Pain in right hip: Secondary | ICD-10-CM

## 2014-07-22 DIAGNOSIS — F411 Generalized anxiety disorder: Secondary | ICD-10-CM

## 2014-07-22 DIAGNOSIS — F329 Major depressive disorder, single episode, unspecified: Secondary | ICD-10-CM | POA: Insufficient documentation

## 2014-07-22 DIAGNOSIS — M25559 Pain in unspecified hip: Secondary | ICD-10-CM

## 2014-07-22 DIAGNOSIS — Z0000 Encounter for general adult medical examination without abnormal findings: Secondary | ICD-10-CM

## 2014-07-22 DIAGNOSIS — M25552 Pain in left hip: Secondary | ICD-10-CM

## 2014-07-22 DIAGNOSIS — I10 Essential (primary) hypertension: Secondary | ICD-10-CM | POA: Diagnosis not present

## 2014-07-22 DIAGNOSIS — Z0001 Encounter for general adult medical examination with abnormal findings: Secondary | ICD-10-CM | POA: Insufficient documentation

## 2014-07-22 LAB — CBC WITH DIFFERENTIAL/PLATELET
Basophils Absolute: 0 10*3/uL (ref 0.0–0.1)
Basophils Relative: 0.2 % (ref 0.0–3.0)
EOS PCT: 2.5 % (ref 0.0–5.0)
Eosinophils Absolute: 0.1 10*3/uL (ref 0.0–0.7)
HCT: 38.9 % — ABNORMAL LOW (ref 39.0–52.0)
HEMOGLOBIN: 13.1 g/dL (ref 13.0–17.0)
LYMPHS ABS: 2.3 10*3/uL (ref 0.7–4.0)
Lymphocytes Relative: 44.6 % (ref 12.0–46.0)
MCHC: 33.7 g/dL (ref 30.0–36.0)
MCV: 91 fl (ref 78.0–100.0)
MONOS PCT: 9.1 % (ref 3.0–12.0)
Monocytes Absolute: 0.5 10*3/uL (ref 0.1–1.0)
Neutro Abs: 2.2 10*3/uL (ref 1.4–7.7)
Neutrophils Relative %: 43.6 % (ref 43.0–77.0)
PLATELETS: 161 10*3/uL (ref 150.0–400.0)
RBC: 4.28 Mil/uL (ref 4.22–5.81)
RDW: 13.8 % (ref 11.5–15.5)
WBC: 5.1 10*3/uL (ref 4.0–10.5)

## 2014-07-22 LAB — URINALYSIS, ROUTINE W REFLEX MICROSCOPIC
Bilirubin Urine: NEGATIVE
Hgb urine dipstick: NEGATIVE
KETONES UR: NEGATIVE
Leukocytes, UA: NEGATIVE
NITRITE: NEGATIVE
PH: 7 (ref 5.0–8.0)
SPECIFIC GRAVITY, URINE: 1.015 (ref 1.000–1.030)
Total Protein, Urine: NEGATIVE
Urine Glucose: NEGATIVE
Urobilinogen, UA: 0.2 (ref 0.0–1.0)

## 2014-07-22 LAB — BASIC METABOLIC PANEL
BUN: 13 mg/dL (ref 6–23)
CO2: 25 mEq/L (ref 19–32)
Calcium: 8.8 mg/dL (ref 8.4–10.5)
Chloride: 107 mEq/L (ref 96–112)
Creatinine, Ser: 0.8 mg/dL (ref 0.4–1.5)
GFR: 125.16 mL/min (ref >60.00)
Glucose, Bld: 115 mg/dL — ABNORMAL HIGH (ref 70–99)
Potassium: 3.9 mEq/L (ref 3.5–5.1)
Sodium: 142 mEq/L (ref 135–145)

## 2014-07-22 LAB — LIPID PANEL
CHOL/HDL RATIO: 4
Cholesterol: 219 mg/dL — ABNORMAL HIGH (ref 0–200)
HDL: 51.2 mg/dL (ref >39.00)
NonHDL: 167.8
TRIGLYCERIDES: 408 mg/dL — AB (ref 0.0–149.0)
VLDL: 81.6 mg/dL — ABNORMAL HIGH (ref 0.0–40.0)

## 2014-07-22 LAB — HEPATIC FUNCTION PANEL
ALT: 35 U/L (ref 0–53)
AST: 42 U/L — ABNORMAL HIGH (ref 0–37)
Albumin: 3.8 g/dL (ref 3.5–5.2)
Alkaline Phosphatase: 93 U/L (ref 39–117)
BILIRUBIN TOTAL: 0.5 mg/dL (ref 0.2–1.2)
Bilirubin, Direct: 0.1 mg/dL (ref 0.0–0.3)
Total Protein: 6.8 g/dL (ref 6.0–8.3)

## 2014-07-22 MED ORDER — PAROXETINE HCL 10 MG PO TABS: 10.0000 mg | ORAL_TABLET | Freq: Every day | ORAL | Status: DC

## 2014-07-22 NOTE — Patient Instructions (Signed)
Please take all new medication as prescribed  - the paxil at 10 mg per day  OK to stay off the cymbalta  Please continue all other medications as before, and refills have been done if requested.  Please have the pharmacy call with any other refills you may need.  Please continue your efforts at being more active, low cholesterol diet, and weight control.  You are otherwise up to date with prevention measures today.  Please keep your appointments with your specialists as you may have planned  Please go to the XRAY Department in the Basement (go straight as you get off the elevator) for the x-ray testing  Please go to the LAB in the Basement (turn left off the elevator) for the tests to be done today  You will be contacted by phone if any changes need to be made immediately.  Otherwise, you will receive a letter about your results with an explanation, but please check with MyChart first.  Please remember to sign up for MyChart if you have not done so, as this will be important to you in the future with finding out test results, communicating by private email, and scheduling acute appointments online when needed.  Please return in 6 months, or sooner if needed

## 2014-07-22 NOTE — Progress Notes (Signed)
Pre visit review using our clinic review tool, if applicable. No additional management support is needed unless otherwise documented below in the visit note. 

## 2014-07-22 NOTE — Progress Notes (Signed)
Subjective:    Patient ID: [name], male    DOB: 1961/02/06, 53 y.o.   MRN: [id number]  HPI  Here for wellness and f/u;  Overall doing ok;  Pt denies CP, worsening SOB, DOE, wheezing, orthopnea, PND, worsening LE edema, palpitations, dizziness or syncope.  Pt denies neurological change such as new headache, facial or extremity weakness.  Pt denies polydipsia, polyuria, or low sugar symptoms. Pt states overall good compliance with treatment and medications, good tolerability, and has been trying to follow lower cholesterol diet.  Pt denies worsening depressive symptoms, suicidal ideation or panic. No fever, night sweats, wt loss, loss of appetite, or other constitutional symptoms.  Pt states good ability with ADL's, home safety reviewed and adequate, no other significant changes in hearing or vision, and occas active with exercise as he cont;s to have unexplained difficulty with ambulation but to c/o of bilat hip pain and stiffness after sitting.  Has not yet gone back to work after left hip surgury due to this, feels unsteady on his feet to stand up, very difficult to stand and has to pull himself up, walks with cane, but is able to walk 1 mile per day.  Has seen mult specialists including ortho, rheumatology, and recently neurology, but could not follow through with the recommended MRI due to claustrophobia.  Mentions some ? Lupus per rheum, cannot recall the specialist he saw. Denies recent ETOH use though has been a problem in the past. Cant take cymbalta due to some kind of side effect.  Is willing to try other such as paxil.   Past Medical History  Diagnosis Date  . Alcohol abuse   . Anxiety    Past Surgical History  Procedure Laterality Date  . Appendectomy    . Hernia repair    . 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 His smoking use included Cigarettes. He has a 1 pack-year smoking history. He has never used smokeless tobacco. He reports that he drinks alcohol. He reports that he does not use illicit drugs. family history includes Colon cancer in his brother; Diabetes in his paternal uncle; Liver cancer in his brother. There is no history of Stroke, Learning disabilities, Hypertension, or Heart disease. No Known Allergies Current Outpatient Prescriptions on File Prior to Visit  Medication Sig Dispense Refill  . lisinopril (PRINIVIL,ZESTRIL) 10 MG tablet Take 1 tablet (10 mg total) by mouth daily. For high blood pressure  30 tablet  0  . gabapentin (NEURONTIN) 300 MG capsule Take 1 capsule (300 mg total) by mouth 3 (three) times daily. For substance withdrawal syndrome  90 capsule  0   No current facility-administered medications on file prior to visit.    Review of Systems Constitutional: Negative for increased diaphoresis, other activity, appetite or other siginficant weight change  HENT: Negative for worsening hearing loss, ear pain, facial swelling, mouth sores and neck stiffness.   Eyes: Negative for other worsening pain, redness or visual disturbance.  Respiratory: Negative for shortness of breath and wheezing.   Cardiovascular: Negative for chest pain and palpitations.  Gastrointestinal: Negative for diarrhea, blood in stool, abdominal distention or other pain Genitourinary: Negative for hematuria, flank pain or change in urine volume.  Musculoskeletal: Negative for myalgias or other joint complaints.  Skin: Negative for color change and  wound.  Neurological: Negative for syncope and numbness. other than noted Hematological: Negative for adenopathy. or other swelling Psychiatric/Behavioral: Negative for hallucinations, self-injury, decreased concentration or other worsening agitation.      Objective:   Physical Exam BP 138/88  Pulse 104  Temp(Src) 97.3 F (36.3 C) (Oral)  Wt 199 lb  12 oz (90.606 kg)  SpO2 97% VS noted,  Constitutional: Pt is oriented to person, place, and time. Appears well-developed and well-nourished.  Head: Normocephalic and atraumatic.  Right Ear: External ear normal.  Left Ear: External ear normal.  Nose: Nose normal.  Mouth/Throat: Oropharynx is clear and moist.  Eyes: Conjunctivae and EOM are normal. Pupils are equal, round, and reactive to light.  Neck: Normal range of motion. Neck supple. No JVD present. No tracheal deviation present.  Cardiovascular: Normal rate, regular rhythm, normal heart sounds and intact distal pulses.   Pulmonary/Chest: Effort normal and breath sounds without rales or wheezing  Abdominal: Soft. Bowel sounds are normal. NT. No HSM  Musculoskeletal: Normal range of motion. Exhibits no edema.  Lymphadenopathy:  Has no cervical adenopathy.  Spine nontender Neurological: Pt is alert and oriented to person, place, and time. Pt has normal reflexes. No cranial nerve deficit. Motor grossly intact, but struggles to stand with cane and holding on to counter, first steps are for shaky and forced , unsteady Skin: Skin is warm and dry. No rash noted.  Psychiatric:  Has normal mood and affect. Behavior is normal.     Assessment & Plan:

## 2014-07-24 DIAGNOSIS — Z789 Other specified health status: Secondary | ICD-10-CM | POA: Insufficient documentation

## 2014-07-24 DIAGNOSIS — I10 Essential (primary) hypertension: Secondary | ICD-10-CM | POA: Insufficient documentation

## 2014-07-24 DIAGNOSIS — Z7289 Other problems related to lifestyle: Secondary | ICD-10-CM | POA: Insufficient documentation

## 2014-07-24 NOTE — Assessment & Plan Note (Signed)
stable overall by history and exam, recent data reviewed with pt, and pt to continue medical treatment as before,  to f/u any worsening symptoms or concerns BP Readings from Last 3 Encounters:  07/22/14 138/88  06/28/14 117/82  06/25/14 129/93

## 2014-07-24 NOTE — Assessment & Plan Note (Signed)
Etiology unclear - ? Bursitis, for films refer sport med/dr Katrinka Blazing

## 2014-07-24 NOTE — Assessment & Plan Note (Signed)
For paxil 10 qd

## 2014-07-24 NOTE — Assessment & Plan Note (Signed)

## 2014-07-25 ENCOUNTER — Ambulatory Visit: Payer: No Typology Code available for payment source

## 2014-07-25 DIAGNOSIS — R7309 Other abnormal glucose: Secondary | ICD-10-CM

## 2014-07-25 LAB — HEMOGLOBIN A1C: Hgb A1c MFr Bld: 5.7 % (ref 4.6–6.5)

## 2014-07-25 LAB — TSH: TSH: 0.58 u[IU]/mL (ref 0.35–4.50)

## 2014-07-25 LAB — PSA: PSA: 1.58 ng/mL (ref 0.10–4.00)

## 2014-07-25 LAB — LDL CHOLESTEROL, DIRECT: Direct LDL: 109.8 mg/dL

## 2014-07-26 ENCOUNTER — Encounter: Payer: Self-pay | Admitting: Internal Medicine

## 2014-08-11 ENCOUNTER — Encounter: Payer: Self-pay | Admitting: Internal Medicine

## 2014-09-05 ENCOUNTER — Encounter: Payer: Self-pay | Admitting: Family Medicine

## 2014-09-05 ENCOUNTER — Ambulatory Visit (INDEPENDENT_AMBULATORY_CARE_PROVIDER_SITE_OTHER): Payer: No Typology Code available for payment source | Admitting: Family Medicine

## 2014-09-05 ENCOUNTER — Other Ambulatory Visit (INDEPENDENT_AMBULATORY_CARE_PROVIDER_SITE_OTHER): Payer: No Typology Code available for payment source

## 2014-09-05 VITALS — BP 132/82 | HR 117 | Ht 68.5 in | Wt 198.0 lb

## 2014-09-05 DIAGNOSIS — M1611 Unilateral primary osteoarthritis, right hip: Secondary | ICD-10-CM | POA: Insufficient documentation

## 2014-09-05 DIAGNOSIS — M25551 Pain in right hip: Secondary | ICD-10-CM

## 2014-09-05 DIAGNOSIS — M1990 Unspecified osteoarthritis, unspecified site: Secondary | ICD-10-CM

## 2014-09-05 DIAGNOSIS — M25552 Pain in left hip: Secondary | ICD-10-CM

## 2014-09-05 MED ORDER — DICLOFENAC SODIUM 2 % TD SOLN: TRANSDERMAL | Status: DC

## 2014-09-05 NOTE — Assessment & Plan Note (Signed)
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 Discussed with patient at great length. We will did consider possibly doing an intra-articular hip injection but secondary to patient's postsurgical changes from abdominal surgeries I did not feel that this would be an in office procedure. Also this would only be temporary and patient likely needs to have a full replacement done in near future. Patient would like [facility] secondary to how he was treated. [facility] for a second opinion. The discussed with patient at this time to try to do some topical anti-inflammatories and was given a prescription, icing protocol, home exercises were given to to avoid significant walking until further evaluation is done. Patient will come back only see me on an as-needed basis because he does need to see an orthopedic surgeon.

## 2014-09-05 NOTE — Assessment & Plan Note (Addendum)
Status post arthroplasty. Concern then patient may have some mild loosening. Difficult to assess secondary to the amount of pain the patient is in. Patient has been referred to an orthopedic surgeon for further evaluation.

## 2014-09-05 NOTE — Progress Notes (Signed)
[name] D.O. [name] [address] [place], [place] [address] Phone: [phone] Subjective:    I'm seeing this patient by the request  of:  [name], MD   CC: Bilateral hip pain BJY:NWGNFAOZHYHPI:Subjective Shawn MooresStevie E Smith is a 53 y.o. male coming in with complaint of bilateral hip pain. Patient does have a past medical history one year ago of having a left hip replacement for severe osteophyte changes by Dr. [name]. Patient unfortunately did not have a good outcome. Patient continued to have pain and then had a falling out with the surgeon. Patient states he was told that he had missed of his own hip someway. Patient states that he has a clicking they can cause pain on that side and then the pain seemed to resolve after the clicking. States that it is difficult to ambulate.  Patient was actually having more pain on the right side. Patient has been told that he does have end-stage osteophytic changes of the right leg as well. Patient states that this pain is so severe that he is unable to do daily activities. Patient states even walking 100 feet from his front door getting out of a car has become a significant challenge. Patient has noticed been doing less and less activity. Patient has been seen by many different providers for this weakness including neurology in rheumatology with no significant findings. Patient is here to see if I can help with any type of pain. Positive nighttime awakening.    Past medical history, social, surgical and family history all reviewed in electronic medical record.   Review of Systems: No headache, visual changes, nausea, vomiting, diarrhea, constipation, dizziness, abdominal pain, skin rash, fevers, chills, night sweats, weight loss, swollen lymph nodes, body aches, joint swelling, muscle aches, chest pain, shortness of breath, mood changes.   Objective Blood pressure 132/82, pulse 117, height 5' 8.5" (1.74 m), weight 198 lb (89.812 kg), SpO2 97.00%.    General: No apparent distress alert and oriented x3 mood and affect normal, dressed appropriately.  HEENT: Pupils equal, extraocular movements intact  Respiratory: Patient's speak in full sentences and does not appear short of breath  Cardiovascular: No lower extremity edema, non tender, no erythema  Skin: Warm dry intact with no signs of infection or rash on extremities or on axial skeleton.  Abdomen: Soft nontender  Neuro: Cranial nerves II through XII are intact, neurovascularly intact in all extremities with 2+ DTRs and 2+ pulses.  Lymph: No lymphadenopathy of posterior or anterior cervical chain or axillae bilaterally.  Gait significantly an antalgic and walking with a cane MSK:  Non tender with full range of motion and good stability and symmetric strength and tone of shoulders, elbows, wrist, , knee and ankles bilaterally.  Hip: Right ROM IR: 10 Deg, ER: 35 Deg, Flexion: 100 Deg, Extension: 100 Deg, Abduction: 25 Deg, Adduction: 25 Deg Strength IR: 3/5, ER: 3/5, Flexion: 4/5, Extension: 3/5, Abduction: 3/5, Adduction: 3/5 Pelvic alignment unremarkable to inspection and palpation. Standing hip rotation and gait without trendelenburg sign / unsteadiness. Greater trochanter tender No tenderness over piriformis  No pain with FABER but +++ FADIR. No SI joint tenderness and normal minimal SI movement.  Contralateral hip shows incision well healed with some mild post inflammatory changes of skin. Patient has decent range of motion with no clicking palpated today the patient does have weakness of the lower extremity. This is symmetric with the contralateral side.    Impression and Recommendations:     This  case required medical decision making of moderate complexity.

## 2014-09-05 NOTE — Patient Instructions (Signed)
Good to see you Your right hip is shot and you need to see orthopaedics.  Try the pennsaid twice daily Try Vitamin D 2000 IU daily Try turmeric 500mg  twice daily.  Ice 20 minutes 2 times a day Make an appointment with  as soon as you can. Or I would consider Dr. Turner Danielsowan.at [facility] [phone] at [address]

## 2014-12-29 ENCOUNTER — Ambulatory Visit: Payer: Self-pay | Admitting: Internal Medicine

## 2014-12-30 ENCOUNTER — Telehealth: Payer: Self-pay | Admitting: Internal Medicine

## 2014-12-30 NOTE — Telephone Encounter (Signed)
No showed for fu on 2/4.  Please advise.   °

## 2014-12-31 NOTE — Telephone Encounter (Signed)
Ok for ROV at his convenience 

## 2015-01-01 ENCOUNTER — Emergency Department (HOSPITAL_COMMUNITY)
Admission: EM | Admit: 2015-01-01 | Discharge: 2015-01-02 | Disposition: A | Discharge status: Home / Self Care | Payer: Self-pay | Attending: Emergency Medicine | Admitting: Emergency Medicine

## 2015-01-01 ENCOUNTER — Encounter (HOSPITAL_COMMUNITY): Payer: Self-pay | Admitting: Adult Health

## 2015-01-01 DIAGNOSIS — F10120 Alcohol abuse with intoxication, uncomplicated: Secondary | ICD-10-CM | POA: Insufficient documentation

## 2015-01-01 DIAGNOSIS — R000 Tachycardia, unspecified: Secondary | ICD-10-CM | POA: Insufficient documentation

## 2015-01-01 DIAGNOSIS — Z79899 Other long term (current) drug therapy: Secondary | ICD-10-CM | POA: Insufficient documentation

## 2015-01-01 DIAGNOSIS — F419 Anxiety disorder, unspecified: Secondary | ICD-10-CM | POA: Insufficient documentation

## 2015-01-01 DIAGNOSIS — Z87891 Personal history of nicotine dependence: Secondary | ICD-10-CM | POA: Insufficient documentation

## 2015-01-01 DIAGNOSIS — Z9049 Acquired absence of other specified parts of digestive tract: Secondary | ICD-10-CM | POA: Insufficient documentation

## 2015-01-01 DIAGNOSIS — F10920 Alcohol use, unspecified with intoxication, uncomplicated: Secondary | ICD-10-CM

## 2015-01-01 LAB — I-STAT CHEM 8, ED
BUN: 28 mg/dL — ABNORMAL HIGH (ref 6–23)
CALCIUM ION: 0.86 mmol/L — AB (ref 1.12–1.23)
Chloride: 103 mmol/L (ref 96–112)
Creatinine, Ser: 1.2 mg/dL (ref 0.50–1.35)
Glucose, Bld: 110 mg/dL — ABNORMAL HIGH (ref 70–99)
HCT: 53 % — ABNORMAL HIGH (ref 39.0–52.0)
HEMOGLOBIN: 18 g/dL — AB (ref 13.0–17.0)
Potassium: 4.1 mmol/L (ref 3.5–5.1)
SODIUM: 135 mmol/L (ref 135–145)
TCO2: 17 mmol/L (ref 0–100)

## 2015-01-01 LAB — COMPREHENSIVE METABOLIC PANEL
ALBUMIN: 4.9 g/dL (ref 3.5–5.2)
ALT: 120 U/L — ABNORMAL HIGH (ref 0–53)
AST: 247 U/L — AB (ref 0–37)
Alkaline Phosphatase: 114 U/L (ref 39–117)
Anion gap: 20 — ABNORMAL HIGH (ref 5–15)
BILIRUBIN TOTAL: 1.3 mg/dL — AB (ref 0.3–1.2)
BUN: 20 mg/dL (ref 6–23)
CHLORIDE: 96 mmol/L (ref 96–112)
CO2: 22 mmol/L (ref 19–32)
Calcium: 9.4 mg/dL (ref 8.4–10.5)
Creatinine, Ser: 1.1 mg/dL (ref 0.50–1.35)
GFR calc Af Amer: 87 mL/min — ABNORMAL LOW (ref >90)
GFR calc non Af Amer: 75 mL/min — ABNORMAL LOW (ref >90)
GLUCOSE: 104 mg/dL — AB (ref 70–99)
POTASSIUM: 3.9 mmol/L (ref 3.5–5.1)
SODIUM: 138 mmol/L (ref 135–145)
Total Protein: 8.5 g/dL — ABNORMAL HIGH (ref 6.0–8.3)

## 2015-01-01 LAB — CBC
HCT: 48 % (ref 39.0–52.0)
HEMOGLOBIN: 17.2 g/dL — AB (ref 13.0–17.0)
MCH: 32.2 pg (ref 26.0–34.0)
MCHC: 35.8 g/dL (ref 30.0–36.0)
MCV: 89.9 fL (ref 78.0–100.0)
Platelets: 252 10*3/uL (ref 150–400)
RBC: 5.34 MIL/uL (ref 4.22–5.81)
RDW: 14.4 % (ref 11.5–15.5)
WBC: 10 10*3/uL (ref 4.0–10.5)

## 2015-01-01 LAB — LIPASE, BLOOD: LIPASE: 33 U/L (ref 11–59)

## 2015-01-01 LAB — ETHANOL: ALCOHOL ETHYL (B): 237 mg/dL — AB (ref 0–9)

## 2015-01-01 MED ORDER — HYDROMORPHONE HCL 1 MG/ML IJ SOLN
1.0000 mg | Freq: Once | INTRAMUSCULAR | Status: AC
  Administered 2015-01-01: 1 mg via INTRAVENOUS
  Filled 2015-01-01: qty 1

## 2015-01-01 MED ORDER — LORAZEPAM 2 MG/ML IJ SOLN
1.0000 mg | Freq: Once | INTRAMUSCULAR | Status: AC
  Administered 2015-01-01: 1 mg via INTRAVENOUS
  Filled 2015-01-01: qty 1

## 2015-01-01 MED ORDER — PROMETHAZINE HCL 25 MG/ML IJ SOLN
25.0000 mg | Freq: Once | INTRAMUSCULAR | Status: AC
  Administered 2015-01-01: 25 mg via INTRAVENOUS
  Filled 2015-01-01: qty 1

## 2015-01-01 MED ORDER — HYDROMORPHONE HCL 1 MG/ML IJ SOLN: 1.0000 mg | Freq: Once | INTRAMUSCULAR | Status: DC

## 2015-01-01 MED ORDER — SODIUM CHLORIDE 0.9 % IV BOLUS (SEPSIS)
1000.0000 mL | Freq: Once | INTRAVENOUS | Status: AC
  Administered 2015-01-01: 1000 mL via INTRAVENOUS

## 2015-01-01 NOTE — ED Notes (Signed)
Presents with 3 days of alcohol intake-poor po intake and nausea, vomiting and bilateral upper quadrant abdominal pain. Pt admits to ETOH abuse for 3 days and denies SI, HI.given 4 mg of zofran by EMS with relief

## 2015-01-01 NOTE — ED Provider Notes (Signed)
CSN: [id number]     Arrival date & time 01/01/15  1803 History   First MD Initiated Contact with Patient 01/01/15 1804     Chief Complaint  Patient presents with  . Alcohol Intoxication  . Abdominal Pain     (Consider location/radiation/quality/duration/timing/severity/associated sxs/prior Treatment) Patient is a 54 y.o. male presenting with intoxication and abdominal pain. The history is provided by the patient.  Alcohol Intoxication This is a new problem. The current episode started more than 2 days ago. The problem occurs constantly. The problem has not changed since onset.Pertinent negatives include no abdominal pain and no shortness of breath. Nothing aggravates the symptoms. Nothing relieves the symptoms. He has tried nothing for the symptoms.  Abdominal Pain Associated symptoms: vomiting   Associated symptoms: no cough, no fever and no shortness of breath     Past Medical History  Diagnosis Date  . Alcohol abuse   . Anxiety    Past Surgical History  Procedure Laterality Date  . Appendectomy    . Hernia repair    . 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 Colon cancer Brother   . Liver cancer Brother   . Diabetes Paternal Uncle   . Stroke Neg Hx   . Learning disabilities Neg Hx   . Hypertension Neg Hx   . Heart disease Neg Hx    History  Substance Use Topics  . Smoking status: Former Smoker -- 0.25 packs/day for 4 years    Types: Cigarettes  . Smokeless tobacco: Never Used  . Alcohol Use: Yes    Review of Systems  Constitutional: Negative for fever.  Respiratory: Negative for cough and shortness of breath.   Gastrointestinal: Positive for vomiting. Negative for abdominal pain.  All other systems reviewed and are negative.     Allergies  Review of patient's allergies  indicates no known allergies.  Home Medications   Prior to Admission medications   Medication Sig Start Date End Date Taking? Authorizing Provider  Diclofenac Sodium 2 % SOLN Apply twice daily. Patient not taking: Reported on 01/01/2015 09/05/14   Judi SaaZachary M Smith, DO  gabapentin (NEURONTIN) 300 MG capsule Take 1 capsule (300 mg total) by mouth 3 (three) times daily. For substance withdrawal syndrome Patient not taking: Reported on 01/01/2015 06/28/14   Shawn KavaAgnes I Nwoko, NP  lisinopril (PRINIVIL,ZESTRIL) 10 MG tablet Take 1 tablet (10 mg total) by mouth daily. For high blood pressure Patient not taking: Reported on 01/01/2015 06/28/14   Shawn KavaAgnes I Nwoko, NP  PARoxetine (PAXIL) 10 MG tablet Take 1 tablet (10 mg total) by mouth daily. Patient not taking: Reported on 01/01/2015 07/22/14   Shawn LevinsJames W John, MD   BP 106/89 mmHg  Pulse 119  Temp(Src) 98.7 F (37.1 C) (Oral)  Resp 17  SpO2 100% Physical Exam  Constitutional: He is oriented to person, place, and time. He appears well-developed and well-nourished. No distress.  HENT:  Head: Normocephalic and atraumatic.  Mouth/Throat: No oropharyngeal exudate.  Eyes: EOM are normal. Pupils are equal, round, and reactive to light.  Neck: Normal range of motion. Neck supple.  Cardiovascular: Regular rhythm.  Tachycardia present.  Exam reveals no friction rub.   No murmur heard. Pulmonary/Chest: Effort normal and breath sounds normal. No respiratory distress. He has no wheezes. He has no rales.  Abdominal: He exhibits no distension. There is no tenderness. There is no rebound.  Musculoskeletal: Normal range of motion. He exhibits no edema.  Neurological: He is alert and oriented to person, place, and time.  Skin: No rash noted. He is not diaphoretic.  Nursing note and vitals reviewed.   ED Course  Procedures (including critical care time) Labs Review Labs Reviewed  I-STAT CHEM 8, ED - Abnormal; Notable for the following:    BUN 28 (*)    Glucose, Bld 110 (*)     Calcium, Ion 0.86 (*)    Hemoglobin 18.0 (*)    HCT 53.0 (*)    All other components within normal limits  CBC  COMPREHENSIVE METABOLIC PANEL  ETHANOL  LIPASE, BLOOD    Imaging Review No results found.   EKG Interpretation None      MDM   Final diagnoses:  Alcohol intoxication, uncomplicated    89M here with abdominal pain, wife called EMS due to his alcohol use. States he's been drinking for 3 days. Associated vomiting. Drank 1 gallon of apple juice today for his nausea.  No abdominal pain on exam. Will check labs, give fluids. Patient's initial alcohol level elevated at 237.  Attempted to ambulate after some rest, he's feeling better and tolerated a bit of food. When trying to ambulate, very unsteady and became more nauseated. Will give another L of fluid, give him some more time to metabolize. HR improving into the 110s from the 130s-140s.  Shawn Mocha, MD 01/02/15 [phone]

## 2015-01-02 MED ORDER — LACTATED RINGERS IV BOLUS (SEPSIS)
1000.0000 mL | Freq: Once | INTRAVENOUS | Status: AC
Start: 2015-01-02 — End: 2015-01-02
  Administered 2015-01-02: 1000 mL via INTRAVENOUS

## 2015-01-02 MED ORDER — ONDANSETRON HCL 4 MG/2ML IJ SOLN
4.0000 mg | Freq: Once | INTRAMUSCULAR | Status: AC
  Administered 2015-01-02: 4 mg via INTRAVENOUS
  Filled 2015-01-02: qty 2

## 2015-01-02 MED ORDER — GI COCKTAIL ~~LOC~~
30.0000 mL | Freq: Once | ORAL | Status: AC
  Administered 2015-01-02: 30 mL via ORAL
  Filled 2015-01-02: qty 30

## 2015-01-02 NOTE — Telephone Encounter (Signed)
Tried to contact.  Phone states not accepting calls at this time.

## 2015-01-02 NOTE — ED Notes (Signed)
Pt a/o x 4 on d/c with friend. 

## 2015-01-02 NOTE — ED Notes (Signed)
Pt ambulated on the hallway as ordered by Dr. Gwendolyn GrantWalden, pt is very unsteady on his fit and still very nauseous at this time.

## 2015-01-02 NOTE — ED Provider Notes (Signed)
3:02 AM pt signed out to me by Dr. Gwendolyn GrantWalden at change of shift.  He is easily arousable, answering questions.  States he feels thirsty.  Pt remains tachcardic at 119, although this is an improvement from 140s per chart review.  Will give another liter of fluid and a dose of ativan and reassess.   4:24 AM pt heart rate is now down to 114.  Per prior chart review at PMD office notes pt has had HR of 104 and 117 during those visits as well.  Pt is stable for discharge.    Ethelda ChickMartha K Linker, MD 01/02/15 [phone]

## 2015-01-02 NOTE — Discharge Instructions (Signed)
Return to the ED with any concerns including difficulty breathing, abdominal pain, vomiting and not able to keep down liquids, decreased level of alertness/lethargy, or any other alarming symptoms 

## 2015-02-15 ENCOUNTER — Ambulatory Visit: Payer: Self-pay | Admitting: Internal Medicine

## 2015-02-16 ENCOUNTER — Encounter: Payer: Self-pay | Admitting: Internal Medicine

## 2015-02-16 ENCOUNTER — Ambulatory Visit (INDEPENDENT_AMBULATORY_CARE_PROVIDER_SITE_OTHER): Payer: No Typology Code available for payment source | Admitting: Internal Medicine

## 2015-02-16 VITALS — BP 134/88 | HR 120 | Temp 98.4°F | Resp 18 | Ht 68.5 in | Wt 192.1 lb

## 2015-02-16 DIAGNOSIS — I10 Essential (primary) hypertension: Secondary | ICD-10-CM

## 2015-02-16 DIAGNOSIS — G4710 Hypersomnia, unspecified: Secondary | ICD-10-CM | POA: Diagnosis not present

## 2015-02-16 DIAGNOSIS — R55 Syncope and collapse: Secondary | ICD-10-CM | POA: Diagnosis not present

## 2015-02-16 DIAGNOSIS — Z0000 Encounter for general adult medical examination without abnormal findings: Secondary | ICD-10-CM

## 2015-02-16 DIAGNOSIS — F10230 Alcohol dependence with withdrawal, uncomplicated: Secondary | ICD-10-CM | POA: Diagnosis not present

## 2015-02-16 NOTE — Progress Notes (Signed)
Pre visit review using our clinic review tool, if applicable. No additional management support is needed unless otherwise documented below in the visit note. 

## 2015-02-16 NOTE — Progress Notes (Signed)
Subjective:    Patient ID: [name], male    DOB: [birth year], 54 y.o.   MRN: [id number]  HPI  Here for wellness and f/u;  Overall doing ok;  Pt denies Chest pain, worsening SOB, DOE, wheezing, orthopnea, PND, worsening LE edema, palpitations, dizziness, but has 2 episodes syncope last wk, one witnessed by wife assoc with convulsive movements, hitting head and moving extremities without responsiveness, lasted 3-4 minutes, lethargic and takes several minutes to come to senses.  Called EMS but pt refused to go to ER.  Still drinks heavily, has hx of w/d, no prior hx of siezure related.    Pt denies neurological change such as new headache, facial or extremity weakness.  Pt denies polydipsia, polyuria, or low sugar symptoms. Pt states overall good compliance with treatment and medications, good tolerability, and has been trying to follow appropriate diet.  Pt denies worsening depressive symptoms, suicidal ideation or panic. No fever, night sweats, wt loss, loss of appetite, or other constitutional symptoms.  Pt states fiar ability with ADL's, has low to mod fall risk, walks with cane today home safety reviewed and adequate, no other significant changes in hearing or vision. ALso c/o daytime hypersomnolence though sleeps over 8 hrs nightly as well, assoc with snoring.   Past Medical History  Diagnosis Date  . Alcohol abuse   . Anxiety    Past Surgical History  Procedure Laterality Date  . Appendectomy    . Hernia repair    . 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 His smoking use included Cigarettes. He has a 1 pack-year smoking history. He has never used smokeless tobacco. He reports that he drinks alcohol. He reports that he does not use illicit drugs. family history includes Colon cancer in his brother; Diabetes in his  paternal uncle; Liver cancer in his brother. There is no history of Stroke, Learning disabilities, Hypertension, or Heart disease. No Known Allergies Current Outpatient Prescriptions on File Prior to Visit  Medication Sig Dispense Refill  . Diclofenac Sodium 2 % SOLN Apply twice daily. (Patient not taking: Reported on 01/01/2015) 112 g 1  . gabapentin (NEURONTIN) 300 MG capsule Take 1 capsule (300 mg total) by mouth 3 (three) times daily. For substance withdrawal syndrome (Patient not taking: Reported on 01/01/2015) 90 capsule 0  . lisinopril (PRINIVIL,ZESTRIL) 10 MG tablet Take 1 tablet (10 mg total) by mouth daily. For high blood pressure (Patient not taking: Reported on 01/01/2015) 30 tablet 0  . PARoxetine (PAXIL) 10 MG tablet Take 1 tablet (10 mg total) by mouth daily. (Patient not taking: Reported on 01/01/2015) 90 tablet 3   No current facility-administered medications on file prior to visit.   Review of Systems Constitutional: Negative for increased diaphoresis, other activity, appetite or siginficant weight change other than noted HENT: Negative for worsening hearing loss, ear pain, facial swelling, mouth sores and neck stiffness.   Eyes: Negative for other worsening pain, redness or visual disturbance.  Respiratory: Negative for shortness of breath and wheezing  Cardiovascular: Negative for chest pain and palpitations.  Gastrointestinal: Negative for diarrhea, blood in stool, abdominal distention or other pain Genitourinary: Negative for hematuria, flank pain or change in urine volume.  Musculoskeletal: Negative for myalgias or other joint complaints.  Skin: Negative for color change and wound or drainage.  Neurological: Negative for syncope and numbness. other than noted Hematological: Negative for adenopathy. or other swelling Psychiatric/Behavioral: Negative for hallucinations, SI, self-injury, decreased concentration or other worsening agitation.      Objective:   Physical Exam BP  134/88 mmHg  Pulse 120  Temp(Src) 98.4 F (36.9 C) (Oral)  Resp 18  Ht 5' 8.5" (1.74 m)  Wt 192 lb 1.9 oz (87.145 kg)  BMI 28.78 kg/m2  SpO2 95% VS noted,  Constitutional: Pt is oriented to person, place, and time. Appears well-developed and well-nourished, in no significant distress, fatigued appearing, general weakness mild, walks with cane for support Head: Normocephalic and atraumatic.  Right Ear: External ear normal.  Left Ear: External ear normal.  Nose: Nose normal.  Mouth/Throat: Oropharynx is clear and moist.  Eyes: Conjunctivae and EOM are normal. Pupils are equal, round, and reactive to light.  Neck: Normal range of motion. Neck supple. No JVD present. No tracheal deviation present or significant neck LA or mass Cardiovascular: Normal rate, regular rhythm, normal heart sounds and intact distal pulses.   Pulmonary/Chest: Effort normal and breath sounds without rales or wheezing  Abdominal: Soft. Bowel sounds are normal. NT. No HSM  Musculoskeletal: Normal range of motion. Exhibits no edema.  Lymphadenopathy:  Has no cervical adenopathy.  Neurological: Pt is alert and oriented to person, place, and time. Pt has normal reflexes. No cranial nerve deficit. Motor grossly intact Skin: Skin is warm and dry. No rash noted.  Psychiatric:  Has normal mood and affect. Behavior is normal.     Assessment & Plan:

## 2015-02-16 NOTE — Patient Instructions (Signed)
Please continue all other medications as before, and refills have been done if requested.  Please have the pharmacy call with any other refills you may need.  Please continue your efforts at being more active, low cholesterol diet, and weight control.  You are otherwise up to date with prevention measures today.  Please keep your appointments with your specialists as you may have planned  You will be contacted regarding the referral for: echocardiogram, Neurology referral, pulmonary referral, and cardiology referral  Please go to the LAB in the Basement (turn left off the elevator) for the tests to be done on Monday Mar 28  You will be contacted by phone if any changes need to be made immediately.  Otherwise, you will receive a letter about your results with an explanation, but please check with MyChart first.  Please remember to sign up for MyChart if you have not done so, as this will be important to you in the future with finding out test results, communicating by private email, and scheduling acute appointments online when needed.  Please return in 6 months, or sooner if needed

## 2015-02-17 DIAGNOSIS — G4710 Hypersomnia, unspecified: Secondary | ICD-10-CM | POA: Insufficient documentation

## 2015-02-17 NOTE — Assessment & Plan Note (Signed)
Denies depressive symptoms worsening, for pulm referral r/o osa

## 2015-02-17 NOTE — Assessment & Plan Note (Signed)

## 2015-02-17 NOTE — Assessment & Plan Note (Signed)
stable overall by history and exam, recent data reviewed with pt, and pt to continue medical treatment as before,  to f/u any worsening symptoms or concerns BP Readings from Last 3 Encounters:  02/16/15 134/88  01/02/15 121/76  09/05/14 132/82

## 2015-02-17 NOTE — Assessment & Plan Note (Signed)
Urged abstinence, to seek outpatient rehab

## 2015-02-17 NOTE — Assessment & Plan Note (Addendum)
?   sieizure related (? ETOH)  vs other - cont same meds for now, refer Neurology, and echo/cardiology, pt declines MRI secondary to severe claustrophobia

## 2015-02-20 ENCOUNTER — Other Ambulatory Visit: Payer: Self-pay | Admitting: Internal Medicine

## 2015-02-20 ENCOUNTER — Encounter: Payer: Self-pay | Admitting: Internal Medicine

## 2015-02-20 ENCOUNTER — Other Ambulatory Visit (INDEPENDENT_AMBULATORY_CARE_PROVIDER_SITE_OTHER): Payer: No Typology Code available for payment source

## 2015-02-20 DIAGNOSIS — R55 Syncope and collapse: Secondary | ICD-10-CM

## 2015-02-20 DIAGNOSIS — R972 Elevated prostate specific antigen [PSA]: Secondary | ICD-10-CM

## 2015-02-20 DIAGNOSIS — Z0000 Encounter for general adult medical examination without abnormal findings: Secondary | ICD-10-CM

## 2015-02-20 LAB — CBC WITH DIFFERENTIAL/PLATELET
BASOS PCT: 0.5 % (ref 0.0–3.0)
Basophils Absolute: 0 10*3/uL (ref 0.0–0.1)
Eosinophils Absolute: 0.1 10*3/uL (ref 0.0–0.7)
Eosinophils Relative: 2 % (ref 0.0–5.0)
HCT: 38.2 % — ABNORMAL LOW (ref 39.0–52.0)
HEMOGLOBIN: 13 g/dL (ref 13.0–17.0)
Lymphocytes Relative: 21.2 % (ref 12.0–46.0)
Lymphs Abs: 1 10*3/uL (ref 0.7–4.0)
MCHC: 34 g/dL (ref 30.0–36.0)
MCV: 92.6 fl (ref 78.0–100.0)
Monocytes Absolute: 0.5 10*3/uL (ref 0.1–1.0)
Monocytes Relative: 9.7 % (ref 3.0–12.0)
NEUTROS PCT: 66.6 % (ref 43.0–77.0)
Neutro Abs: 3.3 10*3/uL (ref 1.4–7.7)
Platelets: 168 10*3/uL (ref 150.0–400.0)
RBC: 4.12 Mil/uL — AB (ref 4.22–5.81)
RDW: 15.1 % (ref 11.5–15.5)
WBC: 4.9 10*3/uL (ref 4.0–10.5)

## 2015-02-20 LAB — URINALYSIS, ROUTINE W REFLEX MICROSCOPIC
LEUKOCYTES UA: NEGATIVE
Nitrite: NEGATIVE
PH: 6 (ref 5.0–8.0)
Total Protein, Urine: 100 — AB
Urine Glucose: NEGATIVE
Urobilinogen, UA: 1 (ref 0.0–1.0)

## 2015-02-20 LAB — BASIC METABOLIC PANEL
BUN: 10 mg/dL (ref 6–23)
CO2: 23 mEq/L (ref 19–32)
Calcium: 9.2 mg/dL (ref 8.4–10.5)
Chloride: 95 mEq/L — ABNORMAL LOW (ref 96–112)
Creatinine, Ser: 0.78 mg/dL (ref 0.40–1.50)
GFR: 134.16 mL/min (ref >60.00)
GLUCOSE: 93 mg/dL (ref 70–99)
Potassium: 3.8 mEq/L (ref 3.5–5.1)
Sodium: 133 mEq/L — ABNORMAL LOW (ref 135–145)

## 2015-02-20 LAB — LIPID PANEL
Cholesterol: 281 mg/dL — ABNORMAL HIGH (ref 0–200)
HDL: 84 mg/dL (ref >39.00)
LDL CALC: 162 mg/dL — AB (ref 0–99)
NonHDL: 197
TRIGLYCERIDES: 175 mg/dL — AB (ref 0.0–149.0)
Total CHOL/HDL Ratio: 3
VLDL: 35 mg/dL (ref 0.0–40.0)

## 2015-02-20 LAB — VITAMIN B12: Vitamin B-12: 164 pg/mL — ABNORMAL LOW (ref 211–911)

## 2015-02-20 LAB — HEPATIC FUNCTION PANEL
ALBUMIN: 4.6 g/dL (ref 3.5–5.2)
ALT: 45 U/L (ref 0–53)
AST: 98 U/L — ABNORMAL HIGH (ref 0–37)
Alkaline Phosphatase: 89 U/L (ref 39–117)
BILIRUBIN TOTAL: 0.9 mg/dL (ref 0.2–1.2)
Bilirubin, Direct: 0.2 mg/dL (ref 0.0–0.3)
TOTAL PROTEIN: 7.6 g/dL (ref 6.0–8.3)

## 2015-02-20 LAB — PSA: PSA: 2.41 ng/mL (ref 0.10–4.00)

## 2015-02-20 LAB — TSH: TSH: 1.07 u[IU]/mL (ref 0.35–4.50)

## 2015-02-20 MED ORDER — ATORVASTATIN CALCIUM 20 MG PO TABS: 20.0000 mg | ORAL_TABLET | Freq: Every day | ORAL | Status: DC

## 2015-02-20 MED ORDER — ASPIRIN EC 81 MG PO TBEC: 81.0000 mg | DELAYED_RELEASE_TABLET | Freq: Every day | ORAL | Status: DC

## 2015-02-24 ENCOUNTER — Telehealth: Payer: Self-pay

## 2015-02-24 NOTE — Telephone Encounter (Signed)
-----   Message from Corwin LevinsJames W John, MD sent at 02/20/2015  5:16 PM EDT ----- Letter sent, cont same tx, except  The test results show that your current treatment is OK, except the LDL cholesterol is very elevated, the B12 level is low, and the PSA has increased somewhat over last year.  The PSA is not elevated more than 4.0, but is increased quite a bit over last year.  We need to do several things.  Please start generic Lipitor 20 mg per day, Aspirin 81 mg per day (enteric coated only), and OTC B12 vitamin - 1 per day for at least 6 months. We will send a new prescription for Lipitor to your pharmacy, and we will refer you to Urology for further evaluation.   There is no other need for change of treatment or further evaluation based on these results at this time.  Any other values that are shown as abnormal (high or low) are not felt to be significant at this time, and can be reviewed at your next visit.  Please continue the same plan for further evaluation and treatment as discussed.  Cherina to inform pt, I will do rx for lipitor and urology referral

## 2015-02-24 NOTE — Telephone Encounter (Signed)
Left message on voicemail that letter has been sent with dr Raphael Gibneyjohn's instructions or pt can call back to get dr Raphael Gibneyjohn's instructions if pt wants instructions before letter arrives in mail

## 2015-03-01 ENCOUNTER — Encounter: Payer: Self-pay | Admitting: Internal Medicine

## 2015-03-03 ENCOUNTER — Other Ambulatory Visit (HOSPITAL_COMMUNITY): Payer: Self-pay

## 2015-03-21 NOTE — Progress Notes (Signed)
Cardiology Office Note   Date:  03/21/2015   ID:  [name], DOB 1961/06/23, MRN [id number]  PCP:  Oliver Barre, MD  Cardiologist:   Charlton Haws, MD   No chief complaint on file.     History of Present Illness: [name] is a 54 y.o. male who presents for evaluation of syncope.  Occurred in middle of March  One witnessed by wife assoc with convulsive movements, hitting head and moving extremities without responsiveness, lasted 3-4 minutes, lethargic and takes several minutes to come to senses. Called EMS but pt refused to go to ER. Still drinks heavily, has hx of w/d, no prior hx of siezure related. Pt denies neurological change such as new headache, facial or extremity weakness. Pt denies polydipsia, polyuria, or low sugar symptoms. Pt states overall good compliance with treatment and medications, good tolerability, and has been trying to follow appropriate diet. Pt denies worsening depressive symptoms, suicidal ideation or panic. No fever, night sweats, wt loss, loss of appetite, or other constitutional symptoms. Pt states fiar ability with ADL's, has low to mod fall risk, walks with cane today home safety reviewed and adequate, no other significant changes in hearing or vision. ALso c/o daytime hypersomnolence though sleeps over 8 hrs nightly as well, assoc with snoring.   3/28  AST  98  No previous cardiac issues  Has not been sober for over 10 years Drinks vodka    Past Medical History  Diagnosis Date  . Alcohol abuse   . Anxiety     Past Surgical History  Procedure Laterality Date  . Appendectomy    . Hernia repair    . 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 aspirin EC 81 MG tablet Take 1 tablet (81 mg total) by mouth daily. 90 tablet 11  .  atorvastatin (LIPITOR) 20 MG tablet Take 1 tablet (20 mg total) by mouth daily. 90 tablet 3  . Diclofenac Sodium 2 % SOLN Apply twice daily. (Patient not taking: Reported on 01/01/2015) 112 g 1  . gabapentin (NEURONTIN) 300 MG capsule Take 1 capsule (300 mg total) by mouth 3 (three) times daily. For substance withdrawal syndrome (Patient not taking: Reported on 01/01/2015) 90 capsule 0  . lisinopril (PRINIVIL,ZESTRIL) 10 MG tablet Take 1 tablet (10 mg total) by mouth daily. For high blood pressure (Patient not taking: Reported on 01/01/2015) 30 tablet 0  . PARoxetine (PAXIL) 10 MG tablet Take 1 tablet (10 mg total) by mouth daily. (Patient not taking: Reported on 01/01/2015) 90 tablet 3   No current facility-administered medications for this visit.    Allergies:   Review of patient's allergies indicates no known allergies.    Social History:  The patient  reports that he has quit smoking. His smoking use included Cigarettes. He has a 1 pack-year smoking history. He has never used smokeless tobacco. He reports that he drinks alcohol. He reports that he does not use illicit drugs.   Family History:  The patient's family history includes Colon cancer in his brother; Diabetes in his paternal uncle; Liver cancer in his brother. There is no history of Stroke, Learning disabilities, Hypertension, or Heart disease.    ROS:  Please see the history of present illness.   Otherwise, review of systems  are positive for none.   All other systems are reviewed and negative.    PHYSICAL EXAM: VS:  There were no vitals taken for this visit. , BMI There is no weight on file to calculate BMI. Affect appropriate Chornically ill black male  HEENT: normal Neck supple with no adenopathy JVP normal no bruits no thyromegaly Lungs clear with no wheezing and good diaphragmatic motion Heart:  S1/S2 no murmur, no rub, gallop or click PMI normal Abdomen: benighn, BS positve, no tenderness, no AAA no bruit.  No HSM or  HJR Distal pulses intact with no bruits No edema Neuro non-focal Skin warm and dry Hip replacement walks with cane     EKG:  10/19/13  SR rate 88 normal  Sinus tachycardia ( doesn't look like flutter) rate 119   Recent Labs: 02/20/2015: ALT 45; BUN 10; Creatinine 0.78; Hemoglobin 13.0; Platelets 168.0; Potassium 3.8; Sodium 133*; TSH 1.07    Lipid Panel    Component Value Date/Time   CHOL 281* 02/20/2015 0943   TRIG 175.0* 02/20/2015 0943   HDL 84.00 02/20/2015 0943   CHOLHDL 3 02/20/2015 0943   VLDL 35.0 02/20/2015 0943   LDLCALC 162* 02/20/2015 0943   LDLDIRECT 109.8 07/22/2014 1656      Wt Readings from Last 3 Encounters:  02/16/15 192 lb 1.9 oz (87.145 kg)  09/05/14 198 lb (89.812 kg)  07/22/14 199 lb 12 oz (90.606 kg)      Other studies Reviewed: Additional studies/ records that were reviewed today include: Epic notes and labs.    ASSESSMENT AND PLAN:  1.  Syncope:  Etiology more likely related to seizures and ETOH abuse.  Concern for resting tachycardia and DCM form ETOH  Echo ASAP  Depending on EF Will likely benefit from addition of beta blocker.  2. ETOH Abuse discussed long term consequences and liver abnormalities as well as association with DCM  Does not seem motivated to quit 3. Chol on statin will have to monitor closely for liver toxicity given ETOH   Current medicines are reviewed at length with the patient today.  The patient does not have concerns regarding medicines.  The following changes have been made:  no change  Labs/ tests ordered today include:  Echo  No orders of the defined types were placed in this encounter.     Disposition:   FU with  Me next available      Signed, Charlton HawsPeter Ashima Shrake, MD  03/21/2015 11:58 AM    [facility] [address], [place], [place]  [address] Phone: [phone]; Fax: [phone]

## 2015-03-22 ENCOUNTER — Ambulatory Visit (INDEPENDENT_AMBULATORY_CARE_PROVIDER_SITE_OTHER): Payer: PRIVATE HEALTH INSURANCE | Admitting: Cardiovascular Disease

## 2015-03-22 ENCOUNTER — Encounter: Payer: Self-pay | Admitting: Cardiovascular Disease

## 2015-03-22 VITALS — BP 130/86 | HR 119 | Ht 68.5 in | Wt 194.1 lb

## 2015-03-22 DIAGNOSIS — R000 Tachycardia, unspecified: Secondary | ICD-10-CM | POA: Diagnosis not present

## 2015-03-22 NOTE — Patient Instructions (Signed)
Medication Instructions:  NO CHANGES  Labwork: NONE  Testing/Procedures: Your physician has requested that you have an echocardiogram. Echocardiography is a painless test that uses sound waves to create images of your heart. It provides your doctor with information about the size and shape of your heart and how well your heart's chambers and valves are working. This procedure takes approximately one hour. There are no restrictions for this procedure. ASAP    Follow-Up:NEXT  AVAILABLE WITH DR Eden EmmsNISHAN  Any Other Special Instructions Will Be Listed Below (If Applicable).

## 2015-03-24 ENCOUNTER — Ambulatory Visit (HOSPITAL_COMMUNITY): Payer: PRIVATE HEALTH INSURANCE | Attending: Cardiovascular Disease

## 2015-03-24 DIAGNOSIS — R000 Tachycardia, unspecified: Secondary | ICD-10-CM

## 2015-03-24 NOTE — Progress Notes (Signed)
2D Echo completed. 03/24/2015 

## 2015-03-29 ENCOUNTER — Ambulatory Visit: Payer: Self-pay | Admitting: Neurology

## 2015-03-31 ENCOUNTER — Telehealth: Payer: Self-pay | Admitting: Neurology

## 2015-03-31 ENCOUNTER — Telehealth: Payer: Self-pay | Admitting: Cardiovascular Disease

## 2015-03-31 DIAGNOSIS — I420 Dilated cardiomyopathy: Secondary | ICD-10-CM

## 2015-03-31 MED ORDER — CARVEDILOL 6.25 MG PO TABS: 6.2500 mg | ORAL_TABLET | Freq: Two times a day (BID) | ORAL | Status: DC

## 2015-03-31 MED ORDER — LISINOPRIL 20 MG PO TABS: 20.0000 mg | ORAL_TABLET | Freq: Every day | ORAL | Status: DC

## 2015-03-31 NOTE — Telephone Encounter (Signed)
Pt no showed NP appt w/ Dr. Karel JarvisAquino 03/29/15. No show letter + policy mailed to pt. Dr. Yetta BarreJones office notified via EPIC referral note / Sherri S.

## 2015-03-31 NOTE — Telephone Encounter (Signed)
New Message  Pt calling about test results. Please call back and discuss.  

## 2015-03-31 NOTE — Telephone Encounter (Signed)
Called patient back with echo results, Per Dr. 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 Increase lisinopril to 20 mg and add coreg 6.25 bid and F/U with me next available may need cath. Patient has an appointment 04/25/15. Sent prescriptions to patient's pharmacy. Patient verbalized understanding.

## 2015-04-05 ENCOUNTER — Institutional Professional Consult (permissible substitution): Payer: Self-pay | Admitting: Pulmonary Disease

## 2015-04-23 NOTE — Progress Notes (Signed)
Cardiology Office Note   Date:  04/23/2015   ID:  Shawn MooresStevie E Smith, DOB May 30, 1961, MRN [id number]  PCP:  Shawn BarreJames John, MD  Cardiologist:   Charlton HawsPeter Jewett Mcgann, MD   No chief complaint on file.     History of Present Illness: [name] is a 54 y.o. male who presents for evaluation of syncope.  Occurred in middle of March  One witnessed by wife assoc with convulsive movements, hitting head and moving extremities without responsiveness, lasted 3-4 minutes, lethargic and takes several minutes to come to senses. Called EMS but pt refused to go to ER. Still drinks heavily, has hx of w/d, no prior hx of siezure related. Pt denies neurological change such as new headache, facial or extremity weakness. Pt denies polydipsia, polyuria, or low sugar symptoms. Pt states overall good compliance with treatment and medications, good tolerability, and has been trying to follow appropriate diet. Pt denies worsening depressive symptoms, suicidal ideation or panic. No fever, night sweats, wt loss, loss of appetite, or other constitutional symptoms. Pt states fiar ability with ADL's, has low to mod fall risk, walks with cane today home safety reviewed and adequate, no other significant changes in hearing or vision. ALso c/o daytime hypersomnolence though sleeps over 8 hrs nightly as well, assoc with snoring.   3/28  AST  98  No previous cardiac issues  Has not been sober for over 10 years Drinks vodka   Echo  03/19/14  EF 35-40%  ACE and coreg added  F/U to discuss cath and titration of meds    Past Medical History  Diagnosis Date  . Alcohol abuse   . Anxiety     Past Surgical History  Procedure Laterality Date  . Appendectomy    . Hernia repair    . Total hip arthroplasty Left 10/15/2013    Procedure: LEFT TOTAL HIP ARTHROPLASTY;  Surgeon: Thera FlakeW D Caffrey Jr., MD;  Location: [facility] OR;  Service: Orthopedics;  Laterality: Left;     Current Outpatient Prescriptions  Medication Sig Dispense Refill   . aspirin EC 81 MG tablet Take 1 tablet (81 mg total) by mouth daily. 90 tablet 11  . atorvastatin (LIPITOR) 20 MG tablet Take 1 tablet (20 mg total) by mouth daily. 90 tablet 3  . carvedilol (COREG) 6.25 MG tablet Take 1 tablet (6.25 mg total) by mouth 2 (two) times daily with a meal. 180 tablet 3  . Diclofenac Sodium 2 % SOLN Apply twice daily. 112 g 1  . gabapentin (NEURONTIN) 300 MG capsule Take 1 capsule (300 mg total) by mouth 3 (three) times daily. For substance withdrawal syndrome 90 capsule 0  . lisinopril (PRINIVIL,ZESTRIL) 20 MG tablet Take 1 tablet (20 mg total) by mouth daily. 90 tablet 3  . PARoxetine (PAXIL) 10 MG tablet Take 1 tablet (10 mg total) by mouth daily. 90 tablet 3   No current facility-administered medications for this visit.    Allergies:   Review of patient's allergies indicates no known allergies.    Social History:  The patient  reports that he has quit smoking. His smoking use included Cigarettes. He has a 1 pack-year smoking history. He has never used smokeless tobacco. He reports that he drinks alcohol. He reports that he does not use illicit drugs.   Family History:  The patient's family history includes Colon cancer in his brother; Diabetes in his paternal uncle; Liver cancer in his brother. There is no history of Stroke, Learning disabilities, Hypertension, or Heart  disease.    ROS:  Please see the history of present illness.   Otherwise, review of systems are positive for none.   All other systems are reviewed and negative.    PHYSICAL EXAM: VS:  There were no vitals taken for this visit. , BMI There is no weight on file to calculate BMI. Affect appropriate Chornically ill black male  HEENT: normal Neck supple with no adenopathy JVP normal no bruits no thyromegaly Lungs clear with no wheezing and good diaphragmatic motion Heart:  S1/S2 no murmur, no rub, gallop or click PMI normal Abdomen: benighn, BS positve, no tenderness, no AAA no bruit.   No HSM or HJR Distal pulses intact with no bruits No edema Neuro non-focal Skin warm and dry Hip replacement walks with cane     EKG:  10/19/13  SR rate 88 normal  Sinus tachycardia ( doesn't look like flutter) rate 119   Recent Labs: 02/20/2015: ALT 45; BUN 10; Creatinine 0.78; Hemoglobin 13.0; Platelets 168.0; Potassium 3.8; Sodium 133*; TSH 1.07    Lipid Panel    Component Value Date/Time   CHOL 281* 02/20/2015 0943   TRIG 175.0* 02/20/2015 0943   HDL 84.00 02/20/2015 0943   CHOLHDL 3 02/20/2015 0943   VLDL 35.0 02/20/2015 0943   LDLCALC 162* 02/20/2015 0943   LDLDIRECT 109.8 07/22/2014 1656      Wt Readings from Last 3 Encounters:  03/22/15 194 lb 1.9 oz (88.052 kg)  02/16/15 192 lb 1.9 oz (87.145 kg)  09/05/14 198 lb (89.812 kg)      Other studies Reviewed: Additional studies/ records that were reviewed today include: Epic notes and labs.    ASSESSMENT AND PLAN:  1.  Syncope:  More worrisome in light of low EF.   Will likely benefit from addition of beta blocker.  2. ETOH Abuse discussed long term consequences and liver abnormalities as well as association with DCM  Does not seem motivated to quit 3. Chol on statin will have to monitor closely for liver toxicity given ETOH   Current medicines are reviewed at length with the patient today.  The patient does not have concerns regarding medicines.  The following changes have been made:  no change  Labs/ tests ordered today include:  Echo  No orders of the defined types were placed in this encounter.     Disposition:   FU with  Me next available      Signed, Charlton Haws, MD  04/23/2015 10:44 AM    [facility] [address] [place], [place], [place]  [address] Phone: [phone]; Fax: [phone]

## 2015-04-25 ENCOUNTER — Encounter: Payer: Self-pay | Admitting: Cardiovascular Disease

## 2015-05-05 ENCOUNTER — Encounter: Payer: Self-pay | Admitting: Cardiovascular Disease

## 2015-06-27 ENCOUNTER — Emergency Department (HOSPITAL_COMMUNITY)
Admission: EM | Admit: 2015-06-27 | Discharge: 2015-06-27 | Disposition: A | Discharge status: Home / Self Care | Payer: PRIVATE HEALTH INSURANCE | Attending: Emergency Medicine | Admitting: Emergency Medicine

## 2015-06-27 ENCOUNTER — Encounter (HOSPITAL_COMMUNITY): Payer: Self-pay

## 2015-06-27 DIAGNOSIS — F10129 Alcohol abuse with intoxication, unspecified: Secondary | ICD-10-CM | POA: Insufficient documentation

## 2015-06-27 DIAGNOSIS — Z87891 Personal history of nicotine dependence: Secondary | ICD-10-CM | POA: Insufficient documentation

## 2015-06-27 DIAGNOSIS — G40909 Epilepsy, unspecified, not intractable, without status epilepticus: Secondary | ICD-10-CM | POA: Insufficient documentation

## 2015-06-27 DIAGNOSIS — R55 Syncope and collapse: Secondary | ICD-10-CM | POA: Insufficient documentation

## 2015-06-27 DIAGNOSIS — Z79899 Other long term (current) drug therapy: Secondary | ICD-10-CM | POA: Insufficient documentation

## 2015-06-27 DIAGNOSIS — Z791 Long term (current) use of non-steroidal anti-inflammatories (NSAID): Secondary | ICD-10-CM | POA: Diagnosis not present

## 2015-06-27 DIAGNOSIS — Z7982 Long term (current) use of aspirin: Secondary | ICD-10-CM | POA: Insufficient documentation

## 2015-06-27 DIAGNOSIS — I10 Essential (primary) hypertension: Secondary | ICD-10-CM | POA: Diagnosis not present

## 2015-06-27 DIAGNOSIS — F419 Anxiety disorder, unspecified: Secondary | ICD-10-CM | POA: Insufficient documentation

## 2015-06-27 DIAGNOSIS — R112 Nausea with vomiting, unspecified: Secondary | ICD-10-CM | POA: Diagnosis not present

## 2015-06-27 DIAGNOSIS — F10929 Alcohol use, unspecified with intoxication, unspecified: Secondary | ICD-10-CM

## 2015-06-27 LAB — COMPREHENSIVE METABOLIC PANEL
ALK PHOS: 60 U/L (ref 38–126)
ALT: 28 U/L (ref 17–63)
AST: 33 U/L (ref 15–41)
Albumin: 4.2 g/dL (ref 3.5–5.0)
Anion gap: 11 (ref 5–15)
BUN: 16 mg/dL (ref 6–20)
CALCIUM: 8.5 mg/dL — AB (ref 8.9–10.3)
CO2: 24 mmol/L (ref 22–32)
CREATININE: 0.77 mg/dL (ref 0.61–1.24)
Chloride: 103 mmol/L (ref 101–111)
GFR calc Af Amer: >60 mL/min (ref >60)
GFR calc non Af Amer: >60 mL/min (ref >60)
GLUCOSE: 92 mg/dL (ref 65–99)
POTASSIUM: 3.8 mmol/L (ref 3.5–5.1)
Sodium: 138 mmol/L (ref 135–145)
Total Bilirubin: 0.5 mg/dL (ref 0.3–1.2)
Total Protein: 7.4 g/dL (ref 6.5–8.1)

## 2015-06-27 LAB — URINALYSIS, ROUTINE W REFLEX MICROSCOPIC
Bilirubin Urine: NEGATIVE
GLUCOSE, UA: NEGATIVE mg/dL
Hgb urine dipstick: NEGATIVE
KETONES UR: NEGATIVE mg/dL
Nitrite: NEGATIVE
PROTEIN: NEGATIVE mg/dL
Specific Gravity, Urine: 1.013 (ref 1.005–1.030)
Urobilinogen, UA: 0.2 mg/dL (ref 0.0–1.0)
pH: 7 (ref 5.0–8.0)

## 2015-06-27 LAB — CBC
HCT: 41.6 % (ref 39.0–52.0)
Hemoglobin: 14.1 g/dL (ref 13.0–17.0)
MCH: 30.1 pg (ref 26.0–34.0)
MCHC: 33.9 g/dL (ref 30.0–36.0)
MCV: 88.7 fL (ref 78.0–100.0)
PLATELETS: 209 10*3/uL (ref 150–400)
RBC: 4.69 MIL/uL (ref 4.22–5.81)
RDW: 13.2 % (ref 11.5–15.5)
WBC: 5.5 10*3/uL (ref 4.0–10.5)

## 2015-06-27 LAB — URINE MICROSCOPIC-ADD ON

## 2015-06-27 LAB — ETHANOL: Alcohol, Ethyl (B): 339 mg/dL (ref <5)

## 2015-06-27 LAB — I-STAT TROPONIN, ED: TROPONIN I, POC: 0 ng/mL (ref 0.00–0.08)

## 2015-06-27 LAB — CBG MONITORING, ED: GLUCOSE-CAPILLARY: 100 mg/dL — AB (ref 65–99)

## 2015-06-27 MED ORDER — ONDANSETRON HCL 4 MG/2ML IJ SOLN
4.0000 mg | Freq: Once | INTRAMUSCULAR | Status: AC
  Administered 2015-06-27: 4 mg via INTRAVENOUS
  Filled 2015-06-27: qty 2

## 2015-06-27 MED ORDER — SODIUM CHLORIDE 0.9 % IV BOLUS (SEPSIS)
1000.0000 mL | Freq: Once | INTRAVENOUS | Status: AC
  Administered 2015-06-27: 1000 mL via INTRAVENOUS

## 2015-06-27 MED ORDER — PROMETHAZINE HCL 25 MG PO TABS: 25.0000 mg | ORAL_TABLET | Freq: Four times a day (QID) | ORAL | Status: DC | PRN

## 2015-06-27 NOTE — ED Provider Notes (Signed)
CSN: [id number]     Arrival date & time 06/27/15  1657 History   First MD Initiated Contact with Patient 06/27/15 1719     Chief Complaint  Patient presents with  . Near Syncope     (Consider location/radiation/quality/duration/timing/severity/associated sxs/prior Treatment) HPI [name] is a 54 y.o. male with history of alcohol abuse, hypertension, seizures, and anxiety, presents to emergency department complaining of possible syncopal versus seizure episode. Patient states that he was at home and some yard work, when his wife reports he fell down on the ground and started shaking. This lasted approximately 3 minutes according to her. She states that is what she called EMS. She states that he was confused after this. Patient reports being sick over last 2 days with nausea, vomiting, diarrhea. He states that he did have 2 shots of liquor today. States "i feel much better now, and ready to go home." Denies any pain at present. No headache.    Past Medical History  Diagnosis Date  . Alcohol abuse   . Anxiety    Past Surgical History  Procedure Laterality Date  . Appendectomy    . Hernia repair    . Total hip arthroplasty Left 10/15/2013    Procedure: LEFT TOTAL HIP ARTHROPLASTY;  Surgeon: Thera Flake., MD;  Location: [facility] OR;  Service: Orthopedics;  Laterality: Left;   Family History  Problem Relation Age of Onset  . Colon cancer Brother   . Liver cancer Brother   . Diabetes Paternal Uncle   . Stroke Neg Hx   . Learning disabilities Neg Hx   . Hypertension Neg Hx   . Heart disease Neg Hx    History  Substance Use Topics  . Smoking status: Former Smoker -- 0.25 packs/day for 4 years    Types: Cigarettes  . Smokeless tobacco: Never Used  . Alcohol Use: Yes    Review of Systems  Constitutional: Negative for fever and chills.  Respiratory: Negative for cough, chest tightness and shortness of breath.   Cardiovascular: Negative for chest pain, palpitations and leg  swelling.  Gastrointestinal: Negative for nausea, vomiting, abdominal pain, diarrhea and abdominal distention.  Genitourinary: Negative for dysuria, urgency, frequency and hematuria.  Musculoskeletal: Negative for myalgias, arthralgias, neck pain and neck stiffness.  Skin: Negative for rash.  Allergic/Immunologic: Negative for immunocompromised state.  Neurological: Positive for seizures and syncope. Negative for dizziness, weakness, light-headedness, numbness and headaches.      Allergies  Review of patient's allergies indicates no known allergies.  Home Medications   Prior to Admission medications   Medication Sig Start Date End Date Taking? Authorizing Provider  aspirin EC 81 MG tablet Take 1 tablet (81 mg total) by mouth daily. 02/20/15   [name], MD  atorvastatin (LIPITOR) 20 MG tablet Take 1 tablet (20 mg total) by mouth daily. 02/20/15 02/20/16  [name], MD  carvedilol (COREG) 6.25 MG tablet Take 1 tablet (6.25 mg total) by mouth 2 (two) times daily with a meal. 03/31/15   Wendall Stade, MD  Diclofenac Sodium 2 % SOLN Apply twice daily. 09/05/14   Judi Saa, DO  gabapentin (NEURONTIN) 300 MG capsule Take 1 capsule (300 mg total) by mouth 3 (three) times daily. For substance withdrawal syndrome 06/28/14   Sanjuana Kava, NP  lisinopril (PRINIVIL,ZESTRIL) 20 MG tablet Take 1 tablet (20 mg total) by mouth daily. 03/31/15   Wendall Stade, MD  PARoxetine (PAXIL) 10 MG tablet Take 1 tablet (  10 mg total) by mouth daily. 07/22/14   [name], MD   BP 115/83 mmHg  Pulse 98  Temp(Src) 98.3 F (36.8 C) (Oral)  Resp 20  Wt 194 lb (87.998 kg)  SpO2 97% Physical Exam  Constitutional: He is oriented to person, place, and time. He appears well-developed and well-nourished.  Appears to be intoxicated  HENT:  Head: Normocephalic and atraumatic.  Eyes: Conjunctivae are normal.  Neck: Neck supple.  Cardiovascular: Normal rate, regular rhythm and normal heart sounds.    Pulmonary/Chest: Effort normal. No respiratory distress. He has no wheezes. He has no rales.  Abdominal: Soft. Bowel sounds are normal. He exhibits no distension. There is no tenderness. There is no rebound.  Musculoskeletal: He exhibits no edema.  Neurological: He is alert and oriented to person, place, and time. No cranial nerve deficit. Coordination normal.  5/5 and equal upper and lower extremity strength bilaterally. Equal grip strength bilaterally. Normal finger to nose and heel to shin. No pronator drift. Patellar reflexes 2+   Skin: Skin is warm and dry.  Nursing note and vitals reviewed.   ED Course  Procedures (including critical care time) Labs Review Labs Reviewed  URINALYSIS, ROUTINE W REFLEX MICROSCOPIC (NOT AT [facility]) - Abnormal; Notable for the following:    Leukocytes, UA MODERATE (*)    All other components within normal limits  ETHANOL - Abnormal; Notable for the following:    Alcohol, Ethyl (B) 339 (*)    All other components within normal limits  COMPREHENSIVE METABOLIC PANEL - Abnormal; Notable for the following:    Calcium 8.5 (*)    All other components within normal limits  CBG MONITORING, ED - Abnormal; Notable for the following:    Glucose-Capillary 100 (*)    All other components within normal limits  CBC  URINE MICROSCOPIC-ADD ON  I-STAT TROPOININ, ED    Imaging Review No results found.   EKG Interpretation   Date/Time:  Tuesday June 27 2015 17:09:07 EDT Ventricular Rate:  95 PR Interval:  144 QRS Duration: 89 QT Interval:  378 QTC Calculation: 475 R Axis:   3 Text Interpretation:  Sinus rhythm Borderline prolonged QT interval No  significant change was found Confirmed by FLOYD MD, Reuel Boom ([id number]) on  06/27/2015 8:11:28 PM      MDM   Final diagnoses:  Syncope, unspecified syncope type  Alcohol intoxication, with unspecified complication  Non-intractable vomiting with nausea, vomiting of unspecified type    Pt is here with syncopal  vs seizure. Pt appears intoxicated. VS normal. Will get labs, monitor.   8:33 PM Patient's alcohol level is 339. He was hydrated, watched for several hours. He is feeling much better. His nausea and vomiting resolved. He was ambulatory up and down the hallway several times to the bathroom. He is requesting to go home. I ordered a CT scan of his head, however he states that he believes it's unnecessary. At this time he is alert and oriented, has got normal neurological exam. He denies any headache. There is no hemotympanum or any neurological deficits on exam. Will discharge home with encouragement to stop drinking and follow with primary care doctor.  Filed Vitals:   06/27/15 1708 06/27/15 2043  BP: 115/83 120/84  Pulse: 98 86  Temp: 98.3 F (36.8 C) 97.6 F (36.4 C)  TempSrc: Oral Oral  Resp: 20 16  Weight: 194 lb (87.998 kg)   SpO2: 97% 96%     Jaynie Crumble, PA-C 06/27/15 2255  Jesusita Oka  Adela Lank, DO 06/28/15 0009

## 2015-06-27 NOTE — ED Notes (Signed)
Bed: WA09 Expected date:  Expected time:  Means of arrival:  Comments: Hall B seizure precautions

## 2015-06-27 NOTE — ED Notes (Signed)
Per EMS, pt from home. Pt c/o dizzy and wanting to pass out since Friday.  N/V.  Loss of appetite.  Swelling to rt upper arm with bruising.  Possible bite.  No LOC.  No syncope.  Alert and oriented.  Notes that he has had 2 shots of vodka today and has not eaten.  Vitals:  112/82, hr 84, resp 16 ra, 96% ra, cbg 97  IV 20g LAC.  No fluids in route

## 2015-06-27 NOTE — Discharge Instructions (Signed)
Phenergan for nausea. Stop drinking alcohol. Follow up with primary care doctor. Follow up with rehab.    Alcohol Intoxication Alcohol intoxication occurs when the amount of alcohol that a person has consumed impairs his or her ability to mentally and physically function. Alcohol directly impairs the normal chemical activity of the brain. Drinking large amounts of alcohol can lead to changes in mental function and behavior, and it can cause many physical effects that can be harmful.  Alcohol intoxication can range in severity from mild to very severe. Various factors can affect the level of intoxication that occurs, such as the person's age, gender, weight, frequency of alcohol consumption, and the presence of other medical conditions (such as diabetes, seizures, or heart conditions). Dangerous levels of alcohol intoxication may occur when people drink large amounts of alcohol in a short period (binge drinking). Alcohol can also be especially dangerous when combined with certain prescription medicines or "recreational" drugs. SIGNS AND SYMPTOMS Some common signs and symptoms of mild alcohol intoxication include:  Loss of coordination.  Changes in mood and behavior.  Impaired judgment.  Slurred speech. As alcohol intoxication progresses to more severe levels, other signs and symptoms will appear. These may include:  Vomiting.  Confusion and impaired memory.  Slowed breathing.  Seizures.  Loss of consciousness. DIAGNOSIS  Your health care provider will take a medical history and perform a physical exam. You will be asked about the amount and type of alcohol you have consumed. Blood tests will be done to measure the concentration of alcohol in your blood. In many places, your blood alcohol level must be lower than 80 mg/dL (1.61%) to legally drive. However, many dangerous effects of alcohol can occur at much lower levels.  TREATMENT  People with alcohol intoxication often do not require  treatment. Most of the effects of alcohol intoxication are temporary, and they go away as the alcohol naturally leaves the body. Your health care provider will monitor your condition until you are stable enough to go home. Fluids are sometimes given through an IV access tube to help prevent dehydration.  HOME CARE INSTRUCTIONS  Do not drive after drinking alcohol.  Stay hydrated. Drink enough water and fluids to keep your urine clear or pale yellow. Avoid caffeine.   Only take over-the-counter or prescription medicines as directed by your health care provider.  SEEK MEDICAL CARE IF:   You have persistent vomiting.   You do not feel better after a few days.  You have frequent alcohol intoxication. Your health care provider can help determine if you should see a substance use treatment counselor. SEEK IMMEDIATE MEDICAL CARE IF:   You become shaky or tremble when you try to stop drinking.   You shake uncontrollably (seizure).   You throw up (vomit) blood. This may be bright red or may look like black coffee grounds.   You have blood in your stool. This may be bright red or may appear as a black, tarry, bad smelling stool.   You become lightheaded or faint.  MAKE SURE YOU:   Understand these instructions.  Will watch your condition.  Will get help right away if you are not doing well or get worse. Document Released: 08/21/2005 Document Revised: 07/14/2013 Document Reviewed: 04/16/2013 [facility] Patient Information 2015 [place], [place]. This information is not intended to replace advice given to you by your health care provider. Make sure you discuss any questions you have with your health care provider.

## 2015-10-21 ENCOUNTER — Encounter (HOSPITAL_COMMUNITY): Payer: Self-pay

## 2015-10-21 ENCOUNTER — Emergency Department (HOSPITAL_COMMUNITY)
Admission: EM | Admit: 2015-10-21 | Discharge: 2015-10-21 | Discharge status: Against Medical Advice | Payer: PRIVATE HEALTH INSURANCE | Attending: Emergency Medicine | Admitting: Emergency Medicine

## 2015-10-21 DIAGNOSIS — F419 Anxiety disorder, unspecified: Secondary | ICD-10-CM | POA: Insufficient documentation

## 2015-10-21 DIAGNOSIS — S0990XA Unspecified injury of head, initial encounter: Secondary | ICD-10-CM

## 2015-10-21 DIAGNOSIS — F10920 Alcohol use, unspecified with intoxication, uncomplicated: Secondary | ICD-10-CM

## 2015-10-21 DIAGNOSIS — Y92092 Bedroom in other non-institutional residence as the place of occurrence of the external cause: Secondary | ICD-10-CM | POA: Insufficient documentation

## 2015-10-21 DIAGNOSIS — Y998 Other external cause status: Secondary | ICD-10-CM | POA: Insufficient documentation

## 2015-10-21 DIAGNOSIS — Z87891 Personal history of nicotine dependence: Secondary | ICD-10-CM | POA: Diagnosis not present

## 2015-10-21 DIAGNOSIS — F10120 Alcohol abuse with intoxication, uncomplicated: Secondary | ICD-10-CM | POA: Diagnosis not present

## 2015-10-21 DIAGNOSIS — Y9389 Activity, other specified: Secondary | ICD-10-CM | POA: Insufficient documentation

## 2015-10-21 DIAGNOSIS — W208XXA Other cause of strike by thrown, projected or falling object, initial encounter: Secondary | ICD-10-CM | POA: Insufficient documentation

## 2015-10-21 DIAGNOSIS — S0101XA Laceration without foreign body of scalp, initial encounter: Secondary | ICD-10-CM | POA: Insufficient documentation

## 2015-10-21 MED ORDER — HYDROCODONE-ACETAMINOPHEN 5-325 MG PO TABS: 2.0000 | ORAL_TABLET | Freq: Once | ORAL | Status: DC
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
## 2015-10-21 NOTE — Discharge Instructions (Signed)
Alcohol Intoxication Follow up with your primary care physician. Alcohol intoxication occurs when you drink enough alcohol that it affects your ability to function. It can be mild or very severe. Drinking a lot of alcohol in a short time is called binge drinking. This can be very harmful. Drinking alcohol can also be more dangerous if you are taking medicines or other drugs. Some of the effects caused by alcohol may include:  Loss of coordination.  Changes in mood and behavior.  Unclear thinking.  Trouble talking (slurred speech).  Throwing up (vomiting).  Confusion.  Slowed breathing.  Twitching and shaking (seizures).  Loss of consciousness. HOME CARE  Do not drive after drinking alcohol.  Drink enough water and fluids to keep your pee (urine) clear or pale yellow. Avoid caffeine.  Only take medicine as told by your doctor. GET HELP IF:  You throw up (vomit) many times.  You do not feel better after a few days.  You frequently have alcohol intoxication. Your doctor can help decide if you should see a substance use treatment counselor. GET HELP RIGHT AWAY IF:  You become shaky when you stop drinking.  You have twitching and shaking.  You throw up blood. It may look bright red or like coffee grounds.  You notice blood in your poop (bowel movements).  You become lightheaded or pass out (faint). MAKE SURE YOU:   Understand these instructions.  Will watch your condition.  Will get help right away if you are not doing well or get worse.   This information is not intended to replace advice given to you by your health care provider. Make sure you discuss any questions you have with your health care provider.   Document Released: 04/29/2008 Document Revised: 07/14/2013 Document Reviewed: 04/16/2013 Elsevier Interactive Patient Education 2016 [employer].  Concussion, Adult A concussion, or closed-head injury, is a brain injury caused by a direct blow to the head  or by a quick and sudden movement (jolt) of the head or neck. Concussions are usually not life-threatening. Even so, the effects of a concussion can be serious. If you have had a concussion before, you are more likely to experience concussion-like symptoms after a direct blow to the head.  CAUSES  Direct blow to the head, such as from running into another player during a soccer game, being hit in a fight, or hitting your head on a hard surface.  A jolt of the head or neck that causes the brain to move back and forth inside the skull, such as in a car crash. SIGNS AND SYMPTOMS The signs of a concussion can be hard to notice. Early on, they may be missed by you, family members, and health care providers. You may look fine but act or feel differently. Symptoms are usually temporary, but they may last for days, weeks, or even longer. Some symptoms may appear right away while others may not show up for hours or days. Every head injury is different. Symptoms include:  Mild to moderate headaches that will not go away.  A feeling of pressure inside your head.  Having more trouble than usual:  Learning or remembering things you have heard.  Answering questions.  Paying attention or concentrating.  Organizing daily tasks.  Making decisions and solving problems.  Slowness in thinking, acting or reacting, speaking, or reading.  Getting lost or being easily confused.  Feeling tired all the time or lacking energy (fatigued).  Feeling drowsy.  Sleep disturbances.  Sleeping more than  usual.  Sleeping less than usual.  Trouble falling asleep.  Trouble sleeping (insomnia).  Loss of balance or feeling lightheaded or dizzy.  Nausea or vomiting.  Numbness or tingling.  Increased sensitivity to:  Sounds.  Lights.  Distractions.  Vision problems or eyes that tire easily.  Diminished sense of taste or smell.  Ringing in the ears.  Mood changes such as feeling sad or  anxious.  Becoming easily irritated or angry for little or no reason.  Lack of motivation.  Seeing or hearing things other people do not see or hear (hallucinations). DIAGNOSIS Your health care provider can usually diagnose a concussion based on a description of your injury and symptoms. He or she will ask whether you passed out (lost consciousness) and whether you are having trouble remembering events that happened right before and during your injury. Your evaluation might include:  A brain scan to look for signs of injury to the brain. Even if the test shows no injury, you may still have a concussion.  Blood tests to be sure other problems are not present. TREATMENT  Concussions are usually treated in an emergency department, in urgent care, or at a clinic. You may need to stay in the hospital overnight for further treatment.  Tell your health care provider if you are taking any medicines, including prescription medicines, over-the-counter medicines, and natural remedies. Some medicines, such as blood thinners (anticoagulants) and aspirin, may increase the chance of complications. Also tell your health care provider whether you have had alcohol or are taking illegal drugs. This information may affect treatment.  Your health care provider will send you home with important instructions to follow.  How fast you will recover from a concussion depends on many factors. These factors include how severe your concussion is, what part of your brain was injured, your age, and how healthy you were before the concussion.  Most people with mild injuries recover fully. Recovery can take time. In general, recovery is slower in older persons. Also, persons who have had a concussion in the past or have other medical problems may find that it takes longer to recover from their current injury. HOME CARE INSTRUCTIONS General Instructions  Carefully follow the directions your health care provider gave  you.  Only take over-the-counter or prescription medicines for pain, discomfort, or fever as directed by your health care provider.  Take only those medicines that your health care provider has approved.  Do not drink alcohol until your health care provider says you are well enough to do so. Alcohol and certain other drugs may slow your recovery and can put you at risk of further injury.  If it is harder than usual to remember things, write them down.  If you are easily distracted, try to do one thing at a time. For example, do not try to watch TV while fixing dinner.  Talk with family members or close friends when making important decisions.  Keep all follow-up appointments. Repeated evaluation of your symptoms is recommended for your recovery.  Watch your symptoms and tell others to do the same. Complications sometimes occur after a concussion. Older adults with a brain injury may have a higher risk of serious complications, such as a blood clot on the brain.  Tell your teachers, school nurse, school counselor, coach, athletic trainer, or work Production designer, theatre/television/film about your injury, symptoms, and restrictions. Tell them about what you can or cannot do. They should watch for:  Increased problems with attention or concentration.  Increased  difficulty remembering or learning new information.  Increased time needed to complete tasks or assignments.  Increased irritability or decreased ability to cope with stress.  Increased symptoms.  Rest. Rest helps the brain to heal. Make sure you:  Get plenty of sleep at night. Avoid staying up late at night.  Keep the same bedtime hours on weekends and weekdays.  Rest during the day. Take daytime naps or rest breaks when you feel tired.  Limit activities that require a lot of thought or concentration. These include:  Doing homework or job-related work.  Watching TV.  Working on the computer.  Avoid any situation where there is potential for  another head injury (football, hockey, soccer, basketball, martial arts, downhill snow sports and horseback riding). Your condition will get worse every time you experience a concussion. You should avoid these activities until you are evaluated by the appropriate follow-up health care providers. Returning To Your Regular Activities You will need to return to your normal activities slowly, not all at once. You must give your body and brain enough time for recovery.  Do not return to sports or other athletic activities until your health care provider tells you it is safe to do so.  Ask your health care provider when you can drive, ride a bicycle, or operate heavy machinery. Your ability to react may be slower after a brain injury. Never do these activities if you are dizzy.  Ask your health care provider about when you can return to work or school. Preventing Another Concussion It is very important to avoid another brain injury, especially before you have recovered. In rare cases, another injury can lead to permanent brain damage, brain swelling, or death. The risk of this is greatest during the first 7-10 days after a head injury. Avoid injuries by:  Wearing a seat belt when riding in a car.  Drinking alcohol only in moderation.  Wearing a helmet when biking, skiing, skateboarding, skating, or doing similar activities.  Avoiding activities that could lead to a second concussion, such as contact or recreational sports, until your health care provider says it is okay.  Taking safety measures in your home.  Remove clutter and tripping hazards from floors and stairways.  Use grab bars in bathrooms and handrails by stairs.  Place non-slip mats on floors and in bathtubs.  Improve lighting in dim areas. SEEK MEDICAL CARE IF:  You have increased problems paying attention or concentrating.  You have increased difficulty remembering or learning new information.  You need more time to complete  tasks or assignments than before.  You have increased irritability or decreased ability to cope with stress.  You have more symptoms than before. Seek medical care if you have any of the following symptoms for more than 2 weeks after your injury:  Lasting (chronic) headaches.  Dizziness or balance problems.  Nausea.  Vision problems.  Increased sensitivity to noise or light.  Depression or mood swings.  Anxiety or irritability.  Memory problems.  Difficulty concentrating or paying attention.  Sleep problems.  Feeling tired all the time. SEEK IMMEDIATE MEDICAL CARE IF:  You have severe or worsening headaches. These may be a sign of a blood clot in the brain.  You have weakness (even if only in one hand, leg, or part of the face).  You have numbness.  You have decreased coordination.  You vomit repeatedly.  You have increased sleepiness.  One pupil is larger than the other.  You have convulsions.  You  have slurred speech.  You have increased confusion. This may be a sign of a blood clot in the brain.  You have increased restlessness, agitation, or irritability.  You are unable to recognize people or places.  You have neck pain.  It is difficult to wake you up.  You have unusual behavior changes.  You lose consciousness. MAKE SURE YOU:  Understand these instructions.  Will watch your condition.  Will get help right away if you are not doing well or get worse.   This information is not intended to replace advice given to you by your health care provider. Make sure you discuss any questions you have with your health care provider.   Document Released: 02/01/2004 Document Revised: 12/02/2014 Document Reviewed: 06/03/2013 Elsevier Interactive Patient Education [employer]
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
## 2017-01-13 ENCOUNTER — Emergency Department (HOSPITAL_COMMUNITY)
Admission: EM | Admit: 2017-01-13 | Discharge: 2017-01-13 | Disposition: A | Discharge status: Home / Self Care | Payer: Medicare Other | Attending: Emergency Medicine | Admitting: Emergency Medicine

## 2017-01-13 ENCOUNTER — Emergency Department (HOSPITAL_COMMUNITY): Payer: Medicare Other

## 2017-01-13 ENCOUNTER — Encounter (HOSPITAL_COMMUNITY): Payer: Self-pay

## 2017-01-13 DIAGNOSIS — N3289 Other specified disorders of bladder: Secondary | ICD-10-CM | POA: Diagnosis not present

## 2017-01-13 DIAGNOSIS — J181 Lobar pneumonia, unspecified organism: Secondary | ICD-10-CM | POA: Insufficient documentation

## 2017-01-13 DIAGNOSIS — J189 Pneumonia, unspecified organism: Secondary | ICD-10-CM

## 2017-01-13 DIAGNOSIS — Z79899 Other long term (current) drug therapy: Secondary | ICD-10-CM | POA: Diagnosis not present

## 2017-01-13 DIAGNOSIS — R1111 Vomiting without nausea: Secondary | ICD-10-CM | POA: Diagnosis not present

## 2017-01-13 DIAGNOSIS — Z87891 Personal history of nicotine dependence: Secondary | ICD-10-CM | POA: Diagnosis not present

## 2017-01-13 DIAGNOSIS — R531 Weakness: Secondary | ICD-10-CM | POA: Diagnosis not present

## 2017-01-13 DIAGNOSIS — Z96642 Presence of left artificial hip joint: Secondary | ICD-10-CM | POA: Insufficient documentation

## 2017-01-13 DIAGNOSIS — R509 Fever, unspecified: Secondary | ICD-10-CM | POA: Diagnosis present

## 2017-01-13 DIAGNOSIS — R404 Transient alteration of awareness: Secondary | ICD-10-CM | POA: Diagnosis not present

## 2017-01-13 DIAGNOSIS — Z7982 Long term (current) use of aspirin: Secondary | ICD-10-CM | POA: Diagnosis not present

## 2017-01-13 DIAGNOSIS — I10 Essential (primary) hypertension: Secondary | ICD-10-CM | POA: Diagnosis not present

## 2017-01-13 LAB — URINALYSIS, ROUTINE W REFLEX MICROSCOPIC
Bilirubin Urine: NEGATIVE
Glucose, UA: NEGATIVE mg/dL
Ketones, ur: NEGATIVE mg/dL
Nitrite: NEGATIVE
Protein, ur: 100 mg/dL — AB
SPECIFIC GRAVITY, URINE: 1.005 (ref 1.005–1.030)
pH: 6 (ref 5.0–8.0)

## 2017-01-13 LAB — CBC WITH DIFFERENTIAL/PLATELET
Basophils Absolute: 0.2 10*3/uL — ABNORMAL HIGH (ref 0.0–0.1)
Basophils Relative: 1 %
Eosinophils Absolute: 0.2 10*3/uL (ref 0.0–0.7)
Eosinophils Relative: 1 %
HEMATOCRIT: 41.5 % (ref 39.0–52.0)
HEMOGLOBIN: 14.9 g/dL (ref 13.0–17.0)
LYMPHS PCT: 7 %
Lymphs Abs: 1.2 10*3/uL (ref 0.7–4.0)
MCH: 31.4 pg (ref 26.0–34.0)
MCHC: 35.9 g/dL (ref 30.0–36.0)
MCV: 87.4 fL (ref 78.0–100.0)
MONOS PCT: 11 %
Monocytes Absolute: 2 10*3/uL — ABNORMAL HIGH (ref 0.1–1.0)
Neutro Abs: 14.2 10*3/uL — ABNORMAL HIGH (ref 1.7–7.7)
Neutrophils Relative %: 80 %
Platelets: 362 10*3/uL (ref 150–400)
RBC: 4.75 MIL/uL (ref 4.22–5.81)
RDW: 13.3 % (ref 11.5–15.5)
WBC: 17.8 10*3/uL — ABNORMAL HIGH (ref 4.0–10.5)

## 2017-01-13 LAB — BASIC METABOLIC PANEL
Anion gap: 16 — ABNORMAL HIGH (ref 5–15)
BUN: 23 mg/dL — AB (ref 6–20)
CALCIUM: 9.4 mg/dL (ref 8.9–10.3)
CHLORIDE: 89 mmol/L — AB (ref 101–111)
CO2: 19 mmol/L — AB (ref 22–32)
CREATININE: 1.5 mg/dL — AB (ref 0.61–1.24)
GFR calc Af Amer: 59 mL/min — ABNORMAL LOW (ref >60)
GFR calc non Af Amer: 51 mL/min — ABNORMAL LOW (ref >60)
GLUCOSE: 136 mg/dL — AB (ref 65–99)
Potassium: 3.4 mmol/L — ABNORMAL LOW (ref 3.5–5.1)
Sodium: 124 mmol/L — ABNORMAL LOW (ref 135–145)

## 2017-01-13 LAB — LACTIC ACID, PLASMA: Lactic Acid, Venous: 1.4 mmol/L (ref 0.5–1.9)

## 2017-01-13 LAB — I-STAT CG4 LACTIC ACID, ED: Lactic Acid, Venous: 2.58 mmol/L (ref 0.5–1.9)

## 2017-01-13 MED ORDER — DEXTROSE 5 % IV SOLN
1.0000 g | Freq: Once | INTRAVENOUS | Status: AC
  Administered 2017-01-13: 1 g via INTRAVENOUS
  Filled 2017-01-13: qty 10

## 2017-01-13 MED ORDER — ONDANSETRON 4 MG PO TBDP: 4.0000 mg | ORAL_TABLET | Freq: Three times a day (TID) | ORAL | 0 refills | Status: DC | PRN

## 2017-01-13 MED ORDER — DIPHENOXYLATE-ATROPINE 2.5-0.025 MG PO TABS: 1.0000 | ORAL_TABLET | Freq: Four times a day (QID) | ORAL | 0 refills | Status: DC | PRN

## 2017-01-13 MED ORDER — SODIUM CHLORIDE 0.9 % IV BOLUS (SEPSIS)
2000.0000 mL | Freq: Once | INTRAVENOUS | Status: AC
  Administered 2017-01-13: 2000 mL via INTRAVENOUS

## 2017-01-13 MED ORDER — LEVOFLOXACIN 500 MG PO TABS: 500.0000 mg | ORAL_TABLET | Freq: Every day | ORAL | 0 refills | Status: DC

## 2017-01-13 MED ORDER — ACETAMINOPHEN 325 MG PO TABS: 650.0000 mg | ORAL_TABLET | Freq: Once | ORAL | Status: DC

## 2017-01-13 MED ORDER — ACETAMINOPHEN 325 MG PO TABS
650.0000 mg | ORAL_TABLET | Freq: Once | ORAL | Status: AC | PRN
  Administered 2017-01-13: 650 mg via ORAL
  Filled 2017-01-13: qty 2

## 2017-01-13 MED ORDER — SODIUM CHLORIDE 0.9 % IV BOLUS (SEPSIS): 1000.0000 mL | Freq: Once | INTRAVENOUS | Status: DC

## 2017-01-13 MED ORDER — ONDANSETRON HCL 4 MG/2ML IJ SOLN
4.0000 mg | Freq: Once | INTRAMUSCULAR | Status: AC
  Administered 2017-01-13: 4 mg via INTRAVENOUS
  Filled 2017-01-13: qty 2

## 2017-01-13 NOTE — ED Notes (Signed)
Patient transported to CT 

## 2017-01-13 NOTE — Discharge Instructions (Signed)
[name] if not improving.

## 2017-01-13 NOTE — ED Notes (Signed)
Patient given urine cup, and asked for urine sample.

## 2017-01-13 NOTE — ED Provider Notes (Addendum)
[facility]-EMERGENCY DEPT Provider Note   CSN: [id number] Arrival date & time: 01/13/17  1128     History   Chief Complaint Chief Complaint  Patient presents with  . Nausea  . Emesis    HPI [name] is a 56 y.o. male. He complains vomiting and fever.  HPI:  56 year old male. Otherwise healthy. Describes a 2 week illness. Nausea and vomiting and diarrhea almost daily. Somewhat dizzy and lightheaded today. Temperature up to 102 yesterday, 103 today. Has had a cough. No sputum production. No chest pain. No blood pus or mucus in his stools. Heme-negative nonbilious emesis.  Past Medical History:  Diagnosis Date  . Alcohol abuse   . Anxiety     Patient Active Problem List   Diagnosis Date Noted  . Hypersomnolence 02/17/2015  . Syncope 02/16/2015  . Arthritis of right hip 09/05/2014  . Essential hypertension, benign 07/24/2014  . Preventative health care 07/22/2014  . Bilateral hip pain 07/22/2014  . Anxiety state, unspecified 07/22/2014  . Alcohol dependence with withdrawal, uncomplicated (HCC) 06/25/2014  . Left hip pain 05/07/2013    Past Surgical History:  Procedure Laterality Date  . APPENDECTOMY    . HERNIA REPAIR    . 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 Authorizing Provider  aspirin EC 81 MG tablet Take 1 tablet (81 mg total) by mouth daily. Patient not taking: Reported on 06/27/2015 02/20/15   [name], MD  atorvastatin (LIPITOR) 20 MG tablet Take 1 tablet (20 mg total) by mouth daily. Patient not taking: Reported on 06/27/2015 02/20/15 02/20/16  [name], MD  carvedilol (COREG) 6.25 MG tablet Take 1 tablet (6.25 mg total) by mouth 2 (two) times daily with a meal. Patient not taking: Reported on 06/27/2015 03/31/15   Wendall Stade, MD  Diclofenac Sodium 2 % SOLN Apply twice daily. Patient not taking: Reported on 06/27/2015 09/05/14   Judi Saa, DO  diphenoxylate-atropine (LOMOTIL) 2.5-0.025 MG tablet Take 1 tablet by mouth 4 (four) times daily as needed for diarrhea or loose stools. 01/13/17   Rolland Porter, MD  gabapentin (NEURONTIN) 300 MG capsule Take 1 capsule (300 mg total) by mouth 3 (three) times daily. For substance withdrawal syndrome Patient not taking: Reported on 06/27/2015 06/28/14   Sanjuana Kava, NP  levofloxacin (LEVAQUIN) 500 MG tablet Take 1 tablet (500 mg total) by mouth daily. 01/13/17   Rolland Porter, MD  lisinopril (PRINIVIL,ZESTRIL) 20 MG tablet Take 1 tablet (20 mg total) by mouth daily. Patient not taking: Reported on 06/27/2015 03/31/15   Wendall Stade, MD  ondansetron (ZOFRAN ODT) 4 MG disintegrating tablet Take 1 tablet (4 mg total) by mouth every 8 (eight) hours as needed for nausea. 01/13/17   Rolland Porter, MD  PARoxetine (PAXIL) 10 MG tablet Take 1 tablet (10 mg total) by mouth daily. Patient not taking: Reported on 06/27/2015 07/22/14   [name], MD  promethazine (PHENERGAN) 25 MG tablet Take 1 tablet (25 mg total) by mouth every 6 (six) hours as needed for nausea or vomiting. Patient not taking: Reported on 01/13/2017 06/27/15   Jaynie Crumble, PA-C    Family History Family History  Problem Relation Age of Onset  . Colon  cancer Brother   . Liver cancer Brother   . Diabetes Paternal Uncle   . Stroke Neg Hx   . Learning disabilities Neg Hx   . Hypertension Neg Hx   . Heart disease Neg Hx     Social History Social History  Substance Use Topics  . Smoking status: Former Smoker    Packs/day: 0.25    Years: 4.00    Types: Cigarettes  . Smokeless tobacco: Never Used  . Alcohol use Yes     Allergies   Patient has no known allergies.   Review of Systems Review of Systems  Constitutional: Negative for appetite change, chills, diaphoresis, fatigue and fever.  HENT: Negative for  mouth sores, sore throat and trouble swallowing.   Eyes: Negative for visual disturbance.  Respiratory: Positive for cough. Negative for chest tightness, shortness of breath and wheezing.   Cardiovascular: Negative for chest pain.  Gastrointestinal: Positive for diarrhea, nausea and vomiting. Negative for abdominal distention and abdominal pain.  Endocrine: Negative for polydipsia, polyphagia and polyuria.  Genitourinary: Negative for dysuria, frequency and hematuria.  Musculoskeletal: Negative for gait problem.  Skin: Negative for color change, pallor and rash.  Neurological: Negative for dizziness, syncope, light-headedness and headaches.  Hematological: Does not bruise/bleed easily.  Psychiatric/Behavioral: Negative for behavioral problems and confusion.     Physical Exam Updated Vital Signs BP 104/78   Pulse 88   Temp 98.3 F (36.8 C) (Oral)   Resp 23   SpO2 94%   Physical Exam  Constitutional: He is oriented to person, place, and time. He appears well-developed and well-nourished. No distress.  HENT:  Head: Normocephalic.  Eyes: Conjunctivae are normal. Pupils are equal, round, and reactive to light. No scleral icterus.  Neck: Normal range of motion. Neck supple. No thyromegaly present.  Cardiovascular: Normal rate and regular rhythm.  Exam reveals no gallop and no friction rub.   No murmur heard. Pulmonary/Chest: Effort normal and breath sounds normal. No respiratory distress. He has no wheezes. He has no rales.  Abdominal: Soft. Bowel sounds are normal. He exhibits no distension. There is no tenderness. There is no rebound.  Soft. No peritoneal irritation.  Musculoskeletal: Normal range of motion.  Neurological: He is alert and oriented to person, place, and time.  Skin: Skin is warm and dry. No rash noted.  Psychiatric: He has a normal mood and affect. His behavior is normal.     ED Treatments / Results  Labs (all labs ordered are listed, but only abnormal results  are displayed) Labs Reviewed  CBC WITH DIFFERENTIAL/PLATELET - Abnormal; Notable for the following:       Result Value   WBC 17.8 (*)    Neutro Abs 14.2 (*)    Monocytes Absolute 2.0 (*)    Basophils Absolute 0.2 (*)    All other components within normal limits  BASIC METABOLIC PANEL - Abnormal; Notable for the following:    Sodium 124 (*)    Potassium 3.4 (*)    Chloride 89 (*)    CO2 19 (*)    Glucose, Bld 136 (*)    BUN 23 (*)    Creatinine, Ser 1.50 (*)    GFR calc non Af Amer 51 (*)    GFR calc Af Amer 59 (*)    Anion gap 16 (*)    All other components within normal limits  URINALYSIS, ROUTINE W REFLEX MICROSCOPIC - Abnormal; Notable for the following:    APPearance HAZY (*)    Hgb  urine dipstick MODERATE (*)    Protein, ur 100 (*)    Leukocytes, UA MODERATE (*)    Bacteria, UA RARE (*)    Squamous Epithelial / LPF 0-5 (*)    All other components within normal limits  I-STAT CG4 LACTIC ACID, ED - Abnormal; Notable for the following:    Lactic Acid, Venous 2.58 (*)    All other components within normal limits  LACTIC ACID, PLASMA    EKG  EKG Interpretation None       Radiology Ct Renal Stone Study  Result Date: 01/13/2017 CLINICAL DATA:  Bilateral flank pain, nausea and vomiting for 10 days EXAM: CT ABDOMEN AND PELVIS WITHOUT CONTRAST TECHNIQUE: Multidetector CT imaging of the abdomen and pelvis was performed following the standard protocol without IV contrast. COMPARISON:  None. FINDINGS: Lower chest: There is segmental infiltrate/pneumonia in right lower lobe posterolaterally. Follow-up to resolution after appropriate treatment is recommended. Please see axial image 4. Hepatobiliary: Unenhanced liver shows no biliary ductal dilatation. Gallbladder is contracted without evidence of calcified gallstones. Pancreas: Unenhanced pancreas without focal abnormality. Spleen: Unenhanced spleen with normal appearance. Adrenals/Urinary Tract: No adrenal gland mass. Unenhanced  kidneys are symmetrical in size. No hydronephrosis or hydroureter. No nephrolithiasis. No calcified ureteral calculi. There is moderate distended urinary bladder. No evidence of thickening of urinary bladder wall. No calcified calculi are noted within urinary bladder. Stomach/Bowel: No gastric outlet obstruction. No small bowel obstruction. No thickened or dilated small bowel loops. No pericecal inflammation. The terminal ileum is unremarkable. The patient is status post appendectomy. Some colonic gas noted within transverse colon. Some colonic gas noted within descending colon and proximal sigmoid colon. No distal colonic obstruction. No definite evidence of colitis or diverticulitis. Vascular/Lymphatic: No aortic aneurysm.  No adenopathy. Reproductive: Prostate gland is grossly unremarkable. Limited examination of the pelvis due to extensive metallic artifacts from left hip prosthesis. Other: There is atrophic fatty replaced inferior aspect of the right rectus muscle. No ascites or free abdominal air. No inguinal adenopathy. Musculoskeletal: No destructive bony lesions are noted. Sagittal images of the spine shows minimal degenerative changes lumbar spine. Left hip prosthesis with anatomic alignment. IMPRESSION: 1. There is infiltrate/pneumonia in right lower lobe posterolaterally with air bronchogram. Aspiration pneumonia cannot be excluded. Follow-up to resolution after appropriate treatment is recommended. 2. No nephrolithiasis. No hydronephrosis or hydroureter. No calcified ureteral calculi. 3. Moderate distended urinary bladder without distal calcified calculi. No thickening of urinary bladder wall. 4. No pericecal inflammation. No small bowel obstruction. Status post appendectomy. 5. Some colonic gas noted within distal colon without significant colonic distension. No definite evidence of colitis or diverticulitis. 6. There are metallic artifacts from left hip prosthesis. Degenerative changes are noted  bilateral SI joints. Electronically Signed   By: Natasha Mead M.D.   On: 01/13/2017 16:04    Procedures Procedures (including critical care time)  Medications Ordered in ED Medications  cefTRIAXone (ROCEPHIN) 1 g in dextrose 5 % 50 mL IVPB (not administered)  acetaminophen (TYLENOL) tablet 650 mg (650 mg Oral Given 01/13/17 1158)  ondansetron (ZOFRAN) injection 4 mg (4 mg Intravenous Given 01/13/17 1252)  sodium chloride 0.9 % bolus 2,000 mL (2,000 mLs Intravenous New Bag/Given 01/13/17 1253)     Initial Impression / Assessment and Plan / ED Course  I have reviewed the triage vital signs and the nursing notes.  Pertinent labs & imaging results that were available during my care of the patient were reviewed by me and considered in my medical decision  making (see chart for details).     Initial lactate is elevated. After fluids is improved. He is taking by mouth without difficulty. Mild hyponatremia. CT abdomen shows benign abdominal findings. Does show right lower lobe pneumonia. On exam he still has minimal pulmonary complaints. He has subtle findings of crackles at the base no increased worker breathing. Not hypoxemic. Given IV Rocephin. Plan will be home, Levaquin, Zofran, Lomotil, primary care follow-up. Push fluids stay hydrated.  Final Clinical Impressions(s) / ED Diagnoses   Final diagnoses:  Community acquired pneumonia of right lower lobe of lung (HCC)    New Prescriptions New Prescriptions   DIPHENOXYLATE-ATROPINE (LOMOTIL) 2.5-0.025 MG TABLET    Take 1 tablet by mouth 4 (four) times daily as needed for diarrhea or loose stools.   LEVOFLOXACIN (LEVAQUIN) 500 MG TABLET    Take 1 tablet (500 mg total) by mouth daily.   ONDANSETRON (ZOFRAN ODT) 4 MG DISINTEGRATING TABLET    Take 1 tablet (4 mg total) by mouth every 8 (eight) hours as needed for nausea.     Rolland PorterMark Kharis Lapenna, MD 01/13/17 1625    Rolland PorterMark Mellonie Guess, MD 01/13/17 [phone]

## 2017-01-13 NOTE — ED Triage Notes (Signed)
CO of N/V/D for the last 9 days. Fever today 100.8 oral. sts he has vomited 6-7 times in the last 24 hours and 5ish occurences of diarrhea. CO of some upper R chest pain with coughing.

## 2017-04-04 ENCOUNTER — Emergency Department (HOSPITAL_COMMUNITY)
Admission: EM | Admit: 2017-04-04 | Discharge: 2017-04-05 | Disposition: A | Discharge status: Home / Self Care | Payer: Medicare Other | Attending: Emergency Medicine | Admitting: Emergency Medicine

## 2017-04-04 ENCOUNTER — Encounter (HOSPITAL_COMMUNITY): Payer: Self-pay | Admitting: *Deleted

## 2017-04-04 DIAGNOSIS — Z7982 Long term (current) use of aspirin: Secondary | ICD-10-CM | POA: Insufficient documentation

## 2017-04-04 DIAGNOSIS — R1084 Generalized abdominal pain: Secondary | ICD-10-CM | POA: Insufficient documentation

## 2017-04-04 DIAGNOSIS — Z87891 Personal history of nicotine dependence: Secondary | ICD-10-CM | POA: Diagnosis not present

## 2017-04-04 DIAGNOSIS — Y908 Blood alcohol level of 240 mg/100 ml or more: Secondary | ICD-10-CM | POA: Diagnosis not present

## 2017-04-04 DIAGNOSIS — Z79899 Other long term (current) drug therapy: Secondary | ICD-10-CM | POA: Diagnosis not present

## 2017-04-04 DIAGNOSIS — F1010 Alcohol abuse, uncomplicated: Secondary | ICD-10-CM

## 2017-04-04 DIAGNOSIS — Z96642 Presence of left artificial hip joint: Secondary | ICD-10-CM | POA: Diagnosis not present

## 2017-04-04 DIAGNOSIS — I10 Essential (primary) hypertension: Secondary | ICD-10-CM | POA: Diagnosis not present

## 2017-04-04 LAB — COMPREHENSIVE METABOLIC PANEL
ALT: 21 U/L (ref 17–63)
AST: 26 U/L (ref 15–41)
Albumin: 4 g/dL (ref 3.5–5.0)
Alkaline Phosphatase: 61 U/L (ref 38–126)
Anion gap: 11 (ref 5–15)
BUN: 10 mg/dL (ref 6–20)
CHLORIDE: 109 mmol/L (ref 101–111)
CO2: 24 mmol/L (ref 22–32)
Calcium: 8.7 mg/dL — ABNORMAL LOW (ref 8.9–10.3)
Creatinine, Ser: 0.83 mg/dL (ref 0.61–1.24)
Glucose, Bld: 114 mg/dL — ABNORMAL HIGH (ref 65–99)
POTASSIUM: 4 mmol/L (ref 3.5–5.1)
Sodium: 144 mmol/L (ref 135–145)
Total Bilirubin: 0.1 mg/dL — ABNORMAL LOW (ref 0.3–1.2)
Total Protein: 6.8 g/dL (ref 6.5–8.1)

## 2017-04-04 LAB — CBC
HEMATOCRIT: 39.2 % (ref 39.0–52.0)
Hemoglobin: 12.6 g/dL — ABNORMAL LOW (ref 13.0–17.0)
MCH: 29 pg (ref 26.0–34.0)
MCHC: 32.1 g/dL (ref 30.0–36.0)
MCV: 90.1 fL (ref 78.0–100.0)
PLATELETS: 264 10*3/uL (ref 150–400)
RBC: 4.35 MIL/uL (ref 4.22–5.81)
RDW: 14.7 % (ref 11.5–15.5)
WBC: 5.2 10*3/uL (ref 4.0–10.5)

## 2017-04-04 LAB — LIPASE, BLOOD: LIPASE: 19 U/L (ref 11–51)

## 2017-04-04 LAB — ETHANOL: Alcohol, Ethyl (B): 301 mg/dL (ref <5)

## 2017-04-04 MED ORDER — GI COCKTAIL ~~LOC~~
30.0000 mL | Freq: Once | ORAL | Status: AC
  Administered 2017-04-05: 30 mL via ORAL
  Filled 2017-04-04: qty 30

## 2017-04-04 NOTE — ED Triage Notes (Signed)
The pt Is c/o abd pain with nausea and vomiting for 2-3 weeks  He was drinking alcohol this am.. He reports that he does not drink everyday

## 2017-04-04 NOTE — ED Provider Notes (Signed)
[facility]ER[facility]Y DEPT Provider Note   CSN: [id number] Arrival date & time: 04/04/17  1843  By signing my name below, I, [name], attest that this documentation has been prepared under the direction and in the presence of Melene Plan, DO. Electronically Signed: Modena Smith, Scribe. 04/04/2017. 11:24 PM.  History   Chief Complaint Chief Complaint  Patient presents with  . Abdominal Pain   The history is provided by the patient. No language interpreter was used.  Abdominal Pain   This is a new problem. The current episode started more than 1 week ago. The problem occurs daily. The problem has been gradually worsening. The pain is associated with alcohol use. The pain is located in the generalized abdominal region. The pain is moderate. Associated symptoms include nausea and vomiting. Pertinent negatives include fever, diarrhea, hematochezia, melena, constipation, headaches, arthralgias and myalgias. Nothing aggravates the symptoms. Nothing relieves the symptoms. Past workup includes surgery.   HPI Comments: [name] is a 56 y.o. male with a PMHx of alcohol abuse who presents to the Emergency Department complaining of intermittent moderate generalized abdominal pain that started a few weeks ago. He states he has been having pain with a associated vomiting (last week with blood, no blood today). He reports that today he started feeling dizzy and vomited with his pain so he came to the ED. No modifying factors. He is unable to keep down food. He reports associated nausea, dizziness, and sleep disturbance (due to pain). He admits to a hx of appendectomy and hernia repair. He admits to added stress from the death of his mother and wife. Denies any diarrhea, blood in stool, constipation, other complaints at this time.    PCP (former): [name], MD  Past Medical History:  Diagnosis Date  . Alcohol abuse   . Anxiety     Patient Active Problem List   Diagnosis Date Noted  .  Hypersomnolence 02/17/2015  . Syncope 02/16/2015  . Arthritis of right hip 09/05/2014  . Essential hypertension, benign 07/24/2014  . Preventative health care 07/22/2014  . Bilateral hip pain 07/22/2014  . Anxiety state, unspecified 07/22/2014  . Alcohol dependence with withdrawal, uncomplicated (HCC) 06/25/2014  . Left hip pain 05/07/2013    Past Surgical History:  Procedure Laterality Date  . APPENDECTOMY    . HERNIA REPAIR    . 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 Authorizing Provider  aspirin EC 81 MG tablet Take 1 tablet (81 mg total) by mouth daily. Patient not taking: Reported on 06/27/2015 02/20/15   [name], MD  atorvastatin (LIPITOR) 20 MG tablet Take 1 tablet (20 mg total) by mouth daily. Patient not taking: Reported on 06/27/2015 02/20/15 02/20/16  [name], MD  carvedilol (COREG) 6.25 MG tablet Take 1 tablet (6.25 mg total) by mouth 2 (two) times daily with a meal. Patient not taking: Reported on 06/27/2015 03/31/15   Wendall Stade, MD  chlordiazePOXIDE (LIBRIUM) 25 MG capsule 50mg  PO TID x 1D, then 25-50mg  PO BID X 1D, then 25-50mg  PO QD X 1D 04/05/17   Melene Plan, DO  Diclofenac Sodium 2 % SOLN Apply twice daily. Patient not taking: Reported on 06/27/2015 09/05/14   Judi Saa, DO  diphenoxylate-atropine (LOMOTIL) 2.5-0.025 MG tablet Take 1 tablet by mouth 4 (four) times daily as needed for diarrhea or loose stools. 01/13/17   Rolland PorterJames, Mark, MD  gabapentin (NEURONTIN) 300 MG capsule Take 1 capsule (300 mg total) by mouth 3 (three) times daily. For substance withdrawal syndrome Patient not taking: Reported on 06/27/2015 06/28/14   Armandina StammerNwoko, Agnes I, NP  levofloxacin (LEVAQUIN) 500 MG tablet Take 1 tablet (500 mg total) by mouth daily.  01/13/17   Rolland PorterJames, Mark, MD  lisinopril (PRINIVIL,ZESTRIL) 20 MG tablet Take 1 tablet (20 mg total) by mouth daily. Patient not taking: Reported on 06/27/2015 03/31/15   Wendall StadeNishan, Peter C, MD  ondansetron (ZOFRAN ODT) 4 MG disintegrating tablet Take 1 tablet (4 mg total) by mouth every 8 (eight) hours as needed for nausea or vomiting. 04/05/17   Melene PlanFloyd, Montell Leopard, DO  PARoxetine (PAXIL) 10 MG tablet Take 1 tablet (10 mg total) by mouth daily. Patient not taking: Reported on 06/27/2015 07/22/14   [name], [name], MD  promethazine (PHENERGAN) 25 MG tablet Take 1 tablet (25 mg total) by mouth every 6 (six) hours as needed for nausea or vomiting. Patient not taking: Reported on 01/13/2017 06/27/15   Jaynie CrumbleKirichenko, Tatyana, PA-C    Family History Family History  Problem Relation Age of Onset  . Colon cancer Brother   . Liver cancer Brother   . Diabetes Paternal Uncle   . Stroke Neg Hx   . Learning disabilities Neg Hx   . Hypertension Neg Hx   . Heart disease Neg Hx     Social History Social History  Substance Use Topics  . Smoking status: Former Smoker    Packs/day: 0.25    Years: 4.00    Types: Cigarettes  . Smokeless tobacco: Never Used  . Alcohol use Yes     Allergies   Patient has no known allergies.   Review of Systems Review of Systems  Constitutional: Negative for chills and fever.  HENT: Negative for congestion and facial swelling.   Eyes: Negative for discharge and visual disturbance.  Respiratory: Negative for shortness of breath.   Cardiovascular: Negative for chest pain and palpitations.  Gastrointestinal: Positive for abdominal pain, nausea and vomiting. Negative for blood in stool, constipation, diarrhea, hematochezia and melena.  Musculoskeletal: Negative for arthralgias and myalgias.  Skin: Negative for color change and rash.  Neurological: Positive for dizziness. Negative for tremors, syncope and headaches.  Psychiatric/Behavioral: Positive for sleep disturbance (from pain).  Negative for confusion and dysphoric mood.     Physical Exam Updated Vital Signs BP 118/74   Pulse 85   Temp 97.8 F (36.6 C) (Oral)   Resp 18   Ht 5\' 8"  (1.727 m)   Wt 190 lb (86.2 kg)   SpO2 100%   BMI 28.89 kg/m   Physical Exam  Constitutional: He is oriented to person, place, and time. He appears well-developed and well-nourished.  Tremulous.   HENT:  Head: Normocephalic and atraumatic.  Eyes: EOM are normal. Pupils are equal, round, and reactive to light.  Neck: Normal range of motion. Neck supple. No JVD present.  Cardiovascular: Normal rate and regular rhythm.  Exam reveals no gallop and no friction rub.   No murmur heard. Pulmonary/Chest: No respiratory distress. He has no wheezes.  Abdominal: He exhibits no distension and no mass. There is no tenderness. There is no rebound and no guarding.  No focal abdominal TTP.   Genitourinary:  Genitourinary Comments: Rectal exam: no hemorrhoids, no melena or gross blood.  Musculoskeletal: Normal range of motion.  Neurological: He is alert and oriented to person, place, and time.  Skin: No rash noted. No pallor.  Psychiatric: He has a normal mood and affect. His behavior is normal.  Nursing note and vitals reviewed.    ED Treatments / Results  DIAGNOSTIC STUDIES: Oxygen Saturation is 100% on RA, normal by my interpretation.    COORDINATION OF CARE: 11:29 PM- Pt advised of plan for treatment and pt agrees.  Labs (all labs ordered are listed, but only abnormal results are displayed) Labs Reviewed  COMPREHENSIVE METABOLIC PANEL - Abnormal; Notable for the following:       Result Value   Glucose, Bld 114 (*)    Calcium 8.7 (*)    Total Bilirubin 0.1 (*)    All other components within normal limits  CBC - Abnormal; Notable for the following:    Hemoglobin 12.6 (*)    All other components within normal limits  ETHANOL - Abnormal; Notable for the following:    Alcohol, Ethyl (B) 301 (*)    All other components  within normal limits  LIPASE, BLOOD  URINALYSIS, ROUTINE W REFLEX MICROSCOPIC    EKG  EKG Interpretation  Date/Time:  Saturday Apr 05 2017 00:09:51 EDT Ventricular Rate:  71 PR Interval:    QRS Duration: 98 QT Interval:  411 QTC Calculation: 447 R Axis:   44 Text Interpretation:  Sinus rhythm No significant change since last tracing Confirmed by Tjay Velazquez MD, Reuel Boom ([id number]) on 04/05/2017 12:15:36 AM       Radiology No results found.  Procedures Procedures (including critical care time)  Medications Ordered in ED Medications  gi cocktail (Maalox,Lidocaine,Donnatal) (30 mLs Oral Given 04/05/17 0022)     Initial Impression / Assessment and Plan / ED Course  I have reviewed the triage vital signs and the nursing notes.  Pertinent labs & imaging results that were available during my care of the patient were reviewed by me and considered in my medical decision making (see chart for details).     56 yo M with a chief complaint of abdominal pain. This been going on for quite some time. Worsening over the past few days. Diffuse abdominal pain described as a burning. Patient does admit to being a heavy drinker though states he only does a couple days a week. Is currently in prison secondary to multiple DUIs. No focal abdominal tenderness on exam. Labs with a 3 g drop of hemoglobin in the last 3 months. States he was vomiting blood about a week ago but has resolved. Rectal exam with no gross blood or melena. Will have the patient follow-up with his family physician. Given alcohol and psychiatric referrals.  12:37 AM:  I have discussed the diagnosis/risks/treatment options with the patient and family and believe the pt to be eligible for discharge home to follow-up with PCP, psych. We also discussed returning to the ED immediately if new or worsening sx occur. We discussed the sx which are most concerning (e.g., sudden worsening pain, fever, inability to tolerate by mouth ) that necessitate  immediate return. Medications administered to the patient during their visit and any new prescriptions provided to the patient are listed below.  Medications given during this visit Medications  gi cocktail (Maalox,Lidocaine,Donnatal) (30 mLs Oral Given 04/05/17 0022)     The patient appears reasonably screen and/or stabilized for discharge and I doubt any other medical condition or other [facility] requiring further screening, evaluation, or treatment in the ED at this  time prior to discharge.    Final Clinical Impressions(s) / ED Diagnoses   Final diagnoses:  Generalized abdominal pain  Alcohol abuse    New Prescriptions New Prescriptions   CHLORDIAZEPOXIDE (LIBRIUM) 25 MG CAPSULE    50mg  PO TID x 1D, then 25-50mg  PO BID X 1D, then 25-50mg  PO QD X 1D   ONDANSETRON (ZOFRAN ODT) 4 MG DISINTEGRATING TABLET    Take 1 tablet (4 mg total) by mouth every 8 (eight) hours as needed for nausea or vomiting.    I personally performed the services described in this documentation, which was scribed in my presence. The recorded information has been reviewed and is accurate.     Melene Plan, DO 04/05/17 [phone]

## 2017-04-05 MED ORDER — ONDANSETRON 4 MG PO TBDP: 4.0000 mg | ORAL_TABLET | Freq: Three times a day (TID) | ORAL | 0 refills | Status: DC | PRN

## 2017-04-05 MED ORDER — CHLORDIAZEPOXIDE HCL 25 MG PO CAPS: ORAL_CAPSULE | ORAL | 0 refills | Status: DC

## 2017-04-05 NOTE — ED Notes (Signed)
PT states understanding of care given, follow up care, and medication prescribed. PT ambulated from ED to car with a steady gait. 

## 2017-06-19 ENCOUNTER — Encounter: Payer: Self-pay | Admitting: Internal Medicine

## 2017-09-10 ENCOUNTER — Emergency Department (HOSPITAL_COMMUNITY)
Admission: EM | Admit: 2017-09-10 | Discharge: 2017-09-10 | Disposition: A | Discharge status: Home / Self Care | Payer: Medicare Other | Attending: Emergency Medicine | Admitting: Emergency Medicine

## 2017-09-10 ENCOUNTER — Emergency Department (HOSPITAL_COMMUNITY): Payer: Medicare Other

## 2017-09-10 ENCOUNTER — Encounter (HOSPITAL_COMMUNITY): Payer: Self-pay

## 2017-09-10 DIAGNOSIS — I10 Essential (primary) hypertension: Secondary | ICD-10-CM | POA: Diagnosis not present

## 2017-09-10 DIAGNOSIS — M879 Osteonecrosis, unspecified: Secondary | ICD-10-CM | POA: Diagnosis not present

## 2017-09-10 DIAGNOSIS — Z87891 Personal history of nicotine dependence: Secondary | ICD-10-CM | POA: Insufficient documentation

## 2017-09-10 DIAGNOSIS — Z7982 Long term (current) use of aspirin: Secondary | ICD-10-CM | POA: Insufficient documentation

## 2017-09-10 DIAGNOSIS — M87051 Idiopathic aseptic necrosis of right femur: Secondary | ICD-10-CM | POA: Insufficient documentation

## 2017-09-10 DIAGNOSIS — M160 Bilateral primary osteoarthritis of hip: Secondary | ICD-10-CM | POA: Diagnosis not present

## 2017-09-10 DIAGNOSIS — Z79899 Other long term (current) drug therapy: Secondary | ICD-10-CM | POA: Insufficient documentation

## 2017-09-10 DIAGNOSIS — M25551 Pain in right hip: Secondary | ICD-10-CM | POA: Diagnosis present

## 2017-09-10 DIAGNOSIS — R52 Pain, unspecified: Secondary | ICD-10-CM

## 2017-09-10 MED ORDER — HYDROCODONE-ACETAMINOPHEN 5-325 MG PO TABS: ORAL_TABLET | ORAL | 0 refills | Status: DC

## 2017-09-10 NOTE — ED Notes (Signed)
Bed: WTR9 Expected date:  Expected time:  Means of arrival:  Comments: 

## 2017-09-10 NOTE — ED Triage Notes (Signed)
Patient c/o bilateral hip pain  X 3 weeks and states it has been getting progressively worse.

## 2017-09-10 NOTE — ED Provider Notes (Signed)
[facility]-EMERGENCY DEPT Provider Note   CSN: [id number] Arrival date & time: 09/10/17  1016     History   Chief Complaint Chief Complaint  Patient presents with  . Hip Pain    HPI   Blood pressure 118/87, pulse 93, temperature 97.6 F (36.4 C), temperature source Oral, resp. rate 14, height 5\' 8"  (1.727 m), weight 89.8 kg (198 lb), SpO2 97 %.  [name] is a 56 y.o. male complaining of exacerbation of his chronic bilateral hip pain. Right hip was replaced in 2014. He states he had a fall 2 days ago and the left hip is been very painful since that time, states that he's been taking Tylenol at home with little relief. He is able to ambulate but it's painful. States that the pain is exacerbated by sitting, standing and pivoting. He has not followed with his orthopedist since the replacement several years ago. He used to use a cane but he does not anymore. There was no other trauma in the fall.  Past Medical History:  Diagnosis Date  . Alcohol abuse   . Anxiety     Patient Active Problem List   Diagnosis Date Noted  . Hypersomnolence 02/17/2015  . Syncope 02/16/2015  . Arthritis of right hip 09/05/2014  . Essential hypertension, benign 07/24/2014  . Preventative health care 07/22/2014  . Bilateral hip pain 07/22/2014  . Anxiety state, unspecified 07/22/2014  . Alcohol dependence with withdrawal, uncomplicated (HCC) 06/25/2014  . Left hip pain 05/07/2013    Past Surgical History:  Procedure Laterality Date  . APPENDECTOMY    . HERNIA REPAIR    . 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 Authorizing Provider  aspirin EC 81 MG tablet Take 1 tablet (81 mg total) by mouth daily. Patient not taking: Reported on  06/27/2015 02/20/15   [name], MD  atorvastatin (LIPITOR) 20 MG tablet Take 1 tablet (20 mg total) by mouth daily. Patient not taking: Reported on 06/27/2015 02/20/15 02/20/16  [name], MD  carvedilol (COREG) 6.25 MG tablet Take 1 tablet (6.25 mg total) by mouth 2 (two) times daily with a meal. Patient not taking: Reported on 06/27/2015 03/31/15   Wendall Stade, MD  chlordiazePOXIDE (LIBRIUM) 25 MG capsule 50mg  PO TID x 1D, then 25-50mg  PO BID X 1D, then 25-50mg  PO QD X 1D 04/05/17   Melene Plan, DO  Diclofenac Sodium 2 % SOLN Apply twice daily. Patient not taking: Reported on 06/27/2015 09/05/14   Judi Saa, DO  diphenoxylate-atropine (LOMOTIL) 2.5-0.025 MG tablet Take 1 tablet by mouth 4 (four) times daily as needed for diarrhea or loose stools. 01/13/17   Rolland Porter, MD  gabapentin (NEURONTIN) 300 MG capsule Take 1 capsule (300 mg total) by mouth 3 (three) times daily. For substance withdrawal syndrome Patient not taking: Reported on 06/27/2015 06/28/14   Armandina Stammer I, NP  HYDROcodone-acetaminophen (NORCO/VICODIN) 5-325 MG tablet Take 1-2 tablets by mouth every 6 hours as needed for pain and/or cough. 09/10/17   Alivia Cimino, Joni Reining, PA-C  levofloxacin (LEVAQUIN) 500 MG tablet Take 1 tablet (500 mg total) by mouth daily. 01/13/17   Rolland Porter, MD  lisinopril (PRINIVIL,ZESTRIL) 20 MG tablet Take 1 tablet (20  mg total) by mouth daily. Patient not taking: Reported on 06/27/2015 03/31/15   Wendall Stade, MD  ondansetron (ZOFRAN ODT) 4 MG disintegrating tablet Take 1 tablet (4 mg total) by mouth every 8 (eight) hours as needed for nausea or vomiting. 04/05/17   Melene Plan, DO  PARoxetine (PAXIL) 10 MG tablet Take 1 tablet (10 mg total) by mouth daily. Patient not taking: Reported on 06/27/2015 07/22/14   [name], MD  promethazine (PHENERGAN) 25 MG tablet Take 1 tablet (25 mg total) by mouth every 6 (six) hours as needed for nausea or vomiting. Patient not taking: Reported on 01/13/2017 06/27/15    Jaynie Crumble, PA-C    Family History Family History  Problem Relation Age of Onset  . Colon cancer Brother   . Liver cancer Brother   . Diabetes Paternal Uncle   . Stroke Neg Hx   . Learning disabilities Neg Hx   . Hypertension Neg Hx   . Heart disease Neg Hx     Social History Social History  Substance Use Topics  . Smoking status: Former Smoker    Packs/day: 0.25    Years: 4.00    Types: Cigarettes  . Smokeless tobacco: Never Used  . Alcohol use Yes     Comment: weekends only     Allergies   Patient has no known allergies.   Review of Systems Review of Systems  A complete review of systems was obtained and all systems are negative except as noted in the HPI and PMH.    Physical Exam Updated Vital Signs BP 118/87 (BP Location: Right Arm)   Pulse 93   Temp 97.6 F (36.4 C) (Oral)   Resp 14   Ht 5\' 8"  (1.727 m)   Wt 89.8 kg (198 lb)   SpO2 97%   BMI 30.11 kg/m   Physical Exam  Constitutional: He is oriented to person, place, and time. He appears well-developed and well-nourished. No distress.  HENT:  Head: Normocephalic and atraumatic.  Mouth/Throat: Oropharynx is clear and moist.  Eyes: Pupils are equal, round, and reactive to light. Conjunctivae and EOM are normal.  Neck: Normal range of motion.  Cardiovascular: Normal rate, regular rhythm and intact distal pulses.   Pulmonary/Chest: Effort normal and breath sounds normal.  Abdominal: Soft. There is no tenderness.  Musculoskeletal: Normal range of motion. He exhibits no edema, tenderness or deformity.  No shortening or rotation, distally neurovascular intact, no overlying skin changes to bilateral hips, no focal bony tenderness to palpation.  Patient ambulates with an antalgic gait.  Neurological: He is alert and oriented to person, place, and time.  Skin: He is not diaphoretic.  Psychiatric: He has a normal mood and affect.  Nursing note and vitals reviewed.    ED Treatments / Results    Labs (all labs ordered are listed, but only abnormal results are displayed) Labs Reviewed - No data to display  EKG  EKG Interpretation None       Radiology Dg Hips Bilat With Pelvis Min 5 Views  Result Date: 09/10/2017 CLINICAL DATA:  Pain for 3 weeks EXAM: DG HIP (WITH OR WITHOUT PELVIS) 5+V BILAT COMPARISON:  None. FINDINGS: Frontal pelvis as well as frontal and lateral views of each hip -total five views -obtained. There is a total hip replacement on the left with prosthetic components well-seated. 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 There is extensive osteoarthritis in the right hip  joint with avascular necrosis in the right femoral head. No acute fracture or dislocation evident. IMPRESSION: 1. Advanced osteoarthritis right hip joint with avascular necrosis right femoral head. 2. Total hip replacement on the left with prosthetic components well-seated. 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 3.  No acute fracture or dislocation. Electronically Signed   By: Bretta BangWilliam  Woodruff III M.D.   On: 09/10/2017 12:57    Procedures Procedures (including critical care time)  Medications Ordered in ED Medications - No data to display   Initial Impression / Assessment and Plan / ED Course  I have reviewed the triage vital signs and the nursing notes.  Pertinent labs & imaging results that were available during my care of the patient were reviewed by me and considered in my medical decision making (see chart for details).     Vitals:   09/10/17 1039 09/10/17 1041  BP: 118/87   Pulse: 93   Resp: 14   Temp: 97.6 F (36.4 C)   TempSrc: Oral   SpO2: 97%   Weight:  89.8 kg (198 lb)  Height:  5\' 8"  (1.727 m)    Medications - No data to display  [name] is 56 y.o. male presenting with fall and exacerbation of his chronic bilateral  hip pain. X-rays with avascular necrosis on the right and intact replacement on the left. I have encouraged this patient to follow closely with orthopedics, I've encouraged him only to ambulate with a cane or a walker at home to prevent future falls. Patient given short prescription for Vicodin and advised to let primary care doc know of findings. Patient verbalized understanding. Pain choices limited in the ED as he is driving himself home.  Discussed case with attending physician who agrees with care plan and disposition.   Evaluation does not show pathology that would require ongoing emergent intervention or inpatient treatment. Pt is hemodynamically stable and mentating appropriately. Discussed findings and plan with patient/guardian, who agrees with care plan. All questions answered. Return precautions discussed and outpatient follow up given.      Final Clinical Impressions(s) / ED Diagnoses   Final diagnoses:  Avascular necrosis of bone of hip, right (HCC)    New Prescriptions New Prescriptions   HYDROCODONE-ACETAMINOPHEN (NORCO/VICODIN) 5-325 MG TABLET    Take 1-2 tablets by mouth every 6 hours as needed for pain and/or cough.     Kaylyn Limisciotta, Amoria Mclees, PA-C 09/10/17 1350    Derwood KaplanNanavati, Ankit, MD 09/11/17 2237

## 2017-09-10 NOTE — Discharge Instructions (Signed)
It is critically important that you follow with an orthopedist and your primary care doctor.   Take vicodin for breakthrough pain, do not drink alcohol, drive, care for children or do other critical tasks while taking vicodin.  Is very important that you use your cane or obtain a walker

## 2017-09-11 ENCOUNTER — Other Ambulatory Visit: Payer: Self-pay

## 2017-09-12 ENCOUNTER — Other Ambulatory Visit (INDEPENDENT_AMBULATORY_CARE_PROVIDER_SITE_OTHER): Payer: Self-pay | Admitting: Orthopaedic Surgery

## 2017-09-12 ENCOUNTER — Other Ambulatory Visit: Payer: Self-pay

## 2017-09-12 ENCOUNTER — Ambulatory Visit (INDEPENDENT_AMBULATORY_CARE_PROVIDER_SITE_OTHER): Payer: Medicare Other | Admitting: Orthopaedic Surgery

## 2017-09-12 ENCOUNTER — Encounter (INDEPENDENT_AMBULATORY_CARE_PROVIDER_SITE_OTHER): Payer: Self-pay | Admitting: Orthopaedic Surgery

## 2017-09-12 VITALS — Ht 68.0 in | Wt 194.0 lb

## 2017-09-12 DIAGNOSIS — M1611 Unilateral primary osteoarthritis, right hip: Secondary | ICD-10-CM

## 2017-09-12 MED ORDER — TRAMADOL HCL 50 MG PO TABS: 50.0000 mg | ORAL_TABLET | Freq: Three times a day (TID) | ORAL | 2 refills | Status: DC | PRN

## 2017-09-12 NOTE — Addendum Note (Signed)
Addended by: Mayra ReelXU, N MICHAEL on: 09/12/2017 08:24 AM   Modules accepted: Orders

## 2017-09-12 NOTE — Patient Outreach (Signed)
[facility]) Care Management  09/12/2017  [name] 18-Dec-1960 [id number]   TELEPHONE SCREENING Referral date: 09/11/17 Referral source: patient engagement tool Referral reason: Patient engagement score 11 Insurance: [employer]  SUBJECTIVE: Telephone call to patient for screening. HIPAA verified with patient. Patient reports he is in need of medication assistance and transportation. Patient states he is not taking any medications because he is unable to afford them. Patient states he was in the emergency room on 09/10/17 due to hip pain. Patient states he was give paperwork at discharge and on his paperwork there was a list of medications he is suppose to take. Patient states he has not seen his doctor for approximately 1 year. RNCM discussed with patient importance of rescheduling appointment with patient to discuss currently health concerns and needed medication.  Patient states he had hip surgery a couple of years ago but states his hip continues to give him additional pain.  Patient states the emergency room doctor gave him a prescription to see and orthopedic. Patient report seeing the orthopedic doctor and he is now scheduled for hip surgery to his opposite hip. Patient reports he has had 5 falls within the past 3 months.Denies any serious injuries from the falls.  Patient states he occasionally uses a cane if walking long distances.  Patient states he does not have transportation to his doctors appointment. Patient report his wife and mother died approximately 1 year ago. Patient states he is unable to take his medication for depression because he cannot afford it.   ASSESSMENT: Patient will benefit from referral to social worker and pharmacist.  [facility]. Patient refused nursing services at this time.   PLAN: RNCM will refer patient to social worker and pharmacist.   George Inaavina Aralynn Brake RN,BSN,CCM [facility] Telephonic   [phone]

## 2017-09-12 NOTE — Progress Notes (Signed)
Office Visit Note   Patient: [name]           Date of Birth: [birth year]           MRN: [id number] Visit Date: 09/12/2017              Requested by: Shawn LevinsJohn, Smith W, MD [address] [place] [place], [place] [id number] PCP: Shawn LevinsJohn, Smith W, MD   Assessment & Plan: Visit Diagnoses:  1. Primary osteoarthritis of right hip     Plan: Patient has advanced degenerative joint disease of the right hip secondary to avascular necrosis.  Patient has constant pain including night pain with bone-on-bone changes on x-ray.  He has significant difficulty with ADLs.  At this point patient would like to pursue total hip replacement.  We discussed the surgery in detail including the risk benefits alternatives.  He understands and wished to proceed.  He has no history of DVT or any issues with his left hip replacement  Follow-Up Instructions: Return for 2-week postop visit.   Orders:  No orders of the defined types were placed in this encounter.  No orders of the defined types were placed in this encounter.     Procedures: No procedures performed   Clinical Data: No additional findings.   Subjective: Chief Complaint  Patient presents with  . Right Hip - Pain    Patient is a 56 year old gentleman who has had right hip pain with significant worsening over the last year or so.  He endorses significant difficulties with ADLs and quality of life.  He is currently on disability.  He is status post left total hip replacement for avascular necrosis.  He does have a history of heavy alcohol use in the past.  Denies any numbness and tingling.    Review of Systems  Constitutional: Negative.   All other systems reviewed and are negative.    Objective: Vital Signs: Ht 5\' 8"  (1.727 m)   Wt 194 lb (88 kg)   BMI 29.50 kg/m   Physical Exam  Constitutional: He is oriented to person, place, and time. He appears well-developed and well-nourished.  HENT:  Head: Normocephalic and atraumatic.    Eyes: Pupils are equal, round, and reactive to light.  Neck: Neck supple.  Pulmonary/Chest: Effort normal.  Abdominal: Soft.  Musculoskeletal: Normal range of motion.  Neurological: He is alert and oriented to person, place, and time.  Skin: Skin is warm.  Psychiatric: He has a normal mood and affect. His behavior is normal. Judgment and thought content normal.  Nursing note and vitals reviewed.   Ortho Exam Right hip exam shows very limited range of motion with significant catching and pain.  Positive Stinchfield sign.  Lateral hip is nontender. Specialty Comments:  No specialty comments available.  Imaging: No results found.   PMFS History: Patient Active Problem List   Diagnosis Date Noted  . 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 Hypersomnolence 02/17/2015  . Syncope 02/16/2015  . Arthritis of right hip 09/05/2014  . Essential hypertension, benign 07/24/2014  . Preventative health care 07/22/2014  . Bilateral hip pain 07/22/2014  . Anxiety state, unspecified 07/22/2014  . Alcohol dependence with withdrawal, uncomplicated (HCC) 06/25/2014  . Left hip pain 05/07/2013   Past Medical History:  Diagnosis Date  . Alcohol abuse   . Anxiety     Family History  Problem Relation Age of Onset  . Colon cancer Brother   . Liver cancer Brother   .  Diabetes Paternal Uncle   . Stroke Neg Hx   . Learning disabilities Neg Hx   . Hypertension Neg Hx   . Heart disease Neg Hx     Past Surgical History:  Procedure Laterality Date  . APPENDECTOMY    . HERNIA REPAIR    . TOTAL HIP ARTHROPLASTY Left 10/15/2013   Procedure: LEFT TOTAL HIP ARTHROPLASTY;  Surgeon: Thera Flake., MD;  Location: [facility] OR;  Service: Orthopedics;  Laterality: Left;   Social History   Occupational History  . [employer]iture [employer]   Social History Main Topics  . Smoking status: Former Smoker    Packs/day: 0.25    Years: 4.00    Types: Cigarettes  . Smokeless tobacco: Never Used  .  Alcohol use Yes     Comment: weekends only  . Drug use: No  . Sexual activity: Yes    Birth control/ protection: None

## 2017-09-17 ENCOUNTER — Encounter (HOSPITAL_COMMUNITY): Payer: Self-pay

## 2017-09-17 ENCOUNTER — Encounter (HOSPITAL_COMMUNITY)
Admission: RE | Admit: 2017-09-17 | Discharge: 2017-09-17 | Disposition: A | Discharge status: Home / Self Care | Payer: Medicare Other | Source: Ambulatory Visit | Attending: Orthopaedic Surgery | Admitting: Orthopaedic Surgery

## 2017-09-17 DIAGNOSIS — Z01818 Encounter for other preprocedural examination: Secondary | ICD-10-CM | POA: Insufficient documentation

## 2017-09-17 DIAGNOSIS — I10 Essential (primary) hypertension: Secondary | ICD-10-CM | POA: Diagnosis not present

## 2017-09-17 DIAGNOSIS — Z79899 Other long term (current) drug therapy: Secondary | ICD-10-CM | POA: Diagnosis not present

## 2017-09-17 DIAGNOSIS — M1611 Unilateral primary osteoarthritis, right hip: Secondary | ICD-10-CM | POA: Diagnosis not present

## 2017-09-17 LAB — SURGICAL PCR SCREEN
MRSA, PCR: NEGATIVE
STAPHYLOCOCCUS AUREUS: NEGATIVE

## 2017-09-17 LAB — APTT: aPTT: 30 seconds (ref 24–36)

## 2017-09-17 LAB — URINALYSIS, ROUTINE W REFLEX MICROSCOPIC
Bilirubin Urine: NEGATIVE
GLUCOSE, UA: NEGATIVE mg/dL
Hgb urine dipstick: NEGATIVE
Ketones, ur: NEGATIVE mg/dL
NITRITE: NEGATIVE
Protein, ur: NEGATIVE mg/dL
SPECIFIC GRAVITY, URINE: 1.021 (ref 1.005–1.030)
pH: 5 (ref 5.0–8.0)

## 2017-09-17 LAB — CBC WITH DIFFERENTIAL/PLATELET
Basophils Absolute: 0 10*3/uL (ref 0.0–0.1)
Basophils Relative: 0 %
EOS PCT: 2 %
Eosinophils Absolute: 0.2 10*3/uL (ref 0.0–0.7)
HEMATOCRIT: 45.3 % (ref 39.0–52.0)
Hemoglobin: 14.7 g/dL (ref 13.0–17.0)
LYMPHS ABS: 2.3 10*3/uL (ref 0.7–4.0)
LYMPHS PCT: 32 %
MCH: 29.3 pg (ref 26.0–34.0)
MCHC: 32.5 g/dL (ref 30.0–36.0)
MCV: 90.2 fL (ref 78.0–100.0)
MONO ABS: 0.5 10*3/uL (ref 0.1–1.0)
MONOS PCT: 7 %
NEUTROS ABS: 4.2 10*3/uL (ref 1.7–7.7)
Neutrophils Relative %: 59 %
Platelets: 278 10*3/uL (ref 150–400)
RBC: 5.02 MIL/uL (ref 4.22–5.81)
RDW: 14.5 % (ref 11.5–15.5)
WBC: 7.2 10*3/uL (ref 4.0–10.5)

## 2017-09-17 LAB — TYPE AND SCREEN
ABO/RH(D): O POS
ANTIBODY SCREEN: NEGATIVE

## 2017-09-17 LAB — COMPREHENSIVE METABOLIC PANEL
ALBUMIN: 4.4 g/dL (ref 3.5–5.0)
ALK PHOS: 75 U/L (ref 38–126)
ALT: 20 U/L (ref 17–63)
AST: 25 U/L (ref 15–41)
Anion gap: 9 (ref 5–15)
BILIRUBIN TOTAL: 0.5 mg/dL (ref 0.3–1.2)
BUN: 11 mg/dL (ref 6–20)
CO2: 24 mmol/L (ref 22–32)
Calcium: 9.6 mg/dL (ref 8.9–10.3)
Chloride: 102 mmol/L (ref 101–111)
Creatinine, Ser: 0.8 mg/dL (ref 0.61–1.24)
GFR calc Af Amer: >60 mL/min (ref >60)
GFR calc non Af Amer: >60 mL/min (ref >60)
GLUCOSE: 93 mg/dL (ref 65–99)
POTASSIUM: 3.8 mmol/L (ref 3.5–5.1)
Sodium: 135 mmol/L (ref 135–145)
TOTAL PROTEIN: 7.6 g/dL (ref 6.5–8.1)

## 2017-09-17 LAB — C-REACTIVE PROTEIN: CRP: 1.3 mg/dL — AB (ref <1.0)

## 2017-09-17 LAB — SEDIMENTATION RATE: Sed Rate: 7 mm/hr (ref 0–16)

## 2017-09-17 LAB — PROTIME-INR
INR: 1.02
Prothrombin Time: 13.3 seconds (ref 11.4–15.2)

## 2017-09-17 NOTE — Progress Notes (Addendum)
GMW:NUUVPCP:[name], Len BlalockJames W, MD  Cardiologist: pt denies  EKG: 04/05/17 in EPIC  Stress test: pt denies ever  ECHO:  03/24/2015 in EPIC  Cardiac Cath: pt denies ever  Chest x-ray: pt denies past year  Pt reports that he has never taken any of the medications on his list (coreg, lisinipril, ect.).Marland Kitchen.he reports Dr. Jonny RuizJohn never gave him prescriptions for these and he never started taking them.

## 2017-09-17 NOTE — Pre-Procedure Instructions (Signed)
[name]  09/17/2017      [id number] [place], [place] - [address] [place] [place] [id number] Phone: [phone] Fax: [phone]    Your procedure is scheduled on September 25, 2017.  [facility] at [phone] AM.  Call this number if you have problems the morning of surgery:  [phone]   Remember:  Do not eat food or drink liquids after midnight.  Take these medicines the morning of surgery with A SIP OF WATER hydrocodone-acetaminophen (norco)-if needed for pain.  7 days prior to surgery STOP taking any Aspirin (unless otherwise instructed by your surgeon), Aleve, Naproxen, Ibuprofen, Motrin, Advil, Goody's, BC's, all herbal medications, fish oil, and all vitamins  Continue all other medications as instructed by your physician except follow the above medication instructions before surgery   Do not wear jewelry, make-up or nail polish.  Do not wear lotions, powders, or perfumes, or deoderant.  Men may shave face and neck.  Do not bring valuables to the hospital.  [facility] is not responsible for any belongings or valuables.  Contacts, dentures or bridgework may not be worn into surgery.  Leave your suitcase in the car.  After surgery it may be brought to your room.  For patients admitted to the hospital, discharge time will be determined by your treatment team.  Patients discharged the day of surgery will not be allowed to drive home.   Special instructions:   - Preparing For Surgery  Before surgery, you can play an important role. Because skin is not sterile, your skin needs to be as free of germs as possible. You can reduce the number of germs on your skin by washing with CHG (chlorahexidine gluconate) Soap before surgery.  CHG is an antiseptic cleaner which kills germs and bonds with the skin to continue killing germs even after washing.  Please do not use if you have an allergy to CHG or  antibacterial soaps. If your skin becomes reddened/irritated stop using the CHG.  Do not shave (including legs and underarms) for at least 48 hours prior to first CHG shower. It is OK to shave your face.  Please follow these instructions carefully.   1. Shower the NIGHT BEFORE SURGERY and the MORNING OF SURGERY with CHG.   2. If you chose to wash your hair, wash your hair first as usual with your normal shampoo.  3. After you shampoo, rinse your hair and body thoroughly to remove the shampoo.  4. Use CHG as you would any other liquid soap. You can apply CHG directly to the skin and wash gently with a scrungie or a clean washcloth.   5. Apply the CHG Soap to your body ONLY FROM THE NECK DOWN.  Do not use on open wounds or open sores. Avoid contact with your eyes, ears, mouth and genitals (private parts). Wash Face and genitals (private parts)  with your normal soap.  6. Wash thoroughly, paying special attention to the area where your surgery will be performed.  7. Thoroughly rinse your body with warm water from the neck down.  8. DO NOT shower/wash with your normal soap after using and rinsing off the CHG Soap.  9. Pat yourself dry with a CLEAN TOWEL.  10. Wear CLEAN PAJAMAS to bed the night before surgery, wear comfortable clothes the morning of surgery  11. Place CLEAN SHEETS on your bed the night of your first shower and DO NOT SLEEP  WITH PETS.    Day of Surgery: Do not apply any deodorants/lotions. Please wear clean clothes to the hospital/surgery center.     Please read over the following fact sheets that you were given. Pain Booklet, Coughing and Deep Breathing, MRSA Information and Surgical Site Infection Prevention

## 2017-09-18 ENCOUNTER — Other Ambulatory Visit (INDEPENDENT_AMBULATORY_CARE_PROVIDER_SITE_OTHER): Payer: Self-pay | Admitting: Orthopaedic Surgery

## 2017-09-18 MED ORDER — METRONIDAZOLE 500 MG PO TABS: 2000.0000 mg | ORAL_TABLET | Freq: Once | ORAL | 0 refills | Status: AC

## 2017-09-18 NOTE — Progress Notes (Signed)
Left message for Waldo LaineSherrie Billings, scheduler for Dr. Roda ShuttersXu that pt's urine result is abnormal.

## 2017-09-19 ENCOUNTER — Encounter (HOSPITAL_COMMUNITY): Payer: Self-pay | Admitting: Vascular Surgery

## 2017-09-19 ENCOUNTER — Telehealth (INDEPENDENT_AMBULATORY_CARE_PROVIDER_SITE_OTHER): Payer: Self-pay | Admitting: Radiology

## 2017-09-19 ENCOUNTER — Other Ambulatory Visit: Payer: Self-pay | Admitting: *Deleted

## 2017-09-19 NOTE — Telephone Encounter (Signed)
Per Dr Roda ShuttersXu I called patient 09/18/2017 and advised that Flagyl Rx sent in to his pharmacy, due to abnormal UA. Pt will get Rx and start ASAP.

## 2017-09-19 NOTE — Patient Outreach (Signed)
[facility]) Care Management  09/19/2017  Marcello MooresStevie E Jonas 1960/12/05 [id number]   CSW was able to make initial contact with patient today to introduce self, CSW role and types of services provided through [facility] Care Management [facility] Care Management).   CSW obtained two HIPAA compliant identifiers from patient, which included patient's name and date of birth. CSW further explained to patient that CSW works with patient's RNCM, also with [facility], . CSW then explained the reason for the call, indicating that patient's RNCM thought that patient would benefit from social work services and resources to assist with transportation, grief/depression and pending needs after his hip surgery scheduled for 09/25/17.    Patient reports that he lives alone; is on disability and is preparing for hip surgery next week. He anticipates needing to go to SNF rehab post operatively; likes [facility] as it is near his home.  He indicates his brother, Peyton NajjarLarry, is nearby and supportive and shared recent loss of his wife (1 year ago) and his mother. CSW offered to plan follow up visit to complete assessment and assist with needs further after his hip surgery next week and while in SNF rehab. Patient agreeable.   CSW will follow along to monitor surgery and disposition thereafter.  Reece LevyJanet Keiarra Charon, MSW, LCSW Clinical Social Worker  [facility] [phone]

## 2017-09-21 NOTE — Progress Notes (Addendum)
Anesthesia Chart Review: Patient is a 56 year old male scheduled for right THA, anterior approach on 09/25/17 by Dr. Gershon MusselNaiping Xu. He presented to the ED 09/10/17 with right hip pain and recurrent falls. 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   History includes former smoker, ETOH abuse, anxiety, appendectomy, hernia repair, left THA 10/15/13. 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 He was prescribed a beta blocker and ace-inhibitor which he never started. Unfortunately he did have have cardiology follow-up (no show to 05/05/15 appointment with Dr. Eden EmmsNishan). BMI is consistent with obesity.   Care Management notes from earlier this month indicate he lives alone, his wife and mother died approximately a year ago, patient has not seen his PCP in > 1 year, and that he cannot afford his anti-depressant. He was referred to Child psychotherapistsocial worker and pharmacist.   - PCP is Dr. [name]. - He saw cardiologist Dr. Charlton HawsPeter Nishan on 03/21/15 for syncope thought likely related to seizure in the setting of ETOH abuse. 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 He was prescribed lisinopril and Coreg, and Dr. Eden EmmsNishan wanted to see him in follow-up to talk about possible cardiac cath (see 03/31/15 telephone note by Cindi CarbonPamela Pate, RN). Patient did not show for his 05/05/15 follow-up appointment.    Meds: He is not taking any routine medications.  Around 2016, he has been prescribed, ASA 81 mg, Lipitor, Coreg, lisinopril, Paxil but reportedly never started. It appears he was prescribed Librium and Neurontin for substance abuse withdrawal in May. He is taking Norco PRN. Flagyl called in by Dr. Roda ShuttersXu on 09/19/17.   BP 127/88   Pulse 97   Temp 36.7 C   Resp 18   Ht 5\' 8"  (1.727 m)   Wt 204 lb 12.8 oz (92.9 kg)   SpO2 99%   BMI 31.14 kg/m   EKG 04/05/17: SR.  Echo 03/24/15: Study Conclusions - Left ventricle: The cavity size was moderately dilated.  Wall   thickness was normal. Systolic function was moderately reduced.   The estimated ejection fraction was in the range of 35% to 40%.   Diffuse hypokinesis. - Left atrium: The atrium was mildly dilated. - Atrial septum: No defect or patent foramen ovale was identified.  Preoperative labs noted. CBC, PT/PTT, and CMET WNL.  UA showed large leukocytes, negative nitrites, WBC too numerous to count, trichomonas present. Dr. Roda ShuttersXu called in Flagyl for abnormal UA.   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 He never started ACE-I and b-blocker, he never followed up with Dr. Eden EmmsNishan (to discuss possible cardiac cath), and he continues to drink ETOH. I think he will require preoperative cardiology evaluation prior to elective surgery. I will follow-up with Dr. Roda ShuttersXu and/or his staff.  Velna Ochsllison Marcel Gary, PA-C [facility]Anesthesiology Phone [phone] 09/21/2017 2:26 PM  Addendum: I reviewed above with anesthesiologist Dr. Kipp Broodavid Joslin who agrees that patient should have a preoperative cardiology evaluation due to concerns for alcoholic dilated CM with decreased EF in 2016. I have notified Cheryl at Dr. Warren DanesXu's office.  Velna Ochsllison Beldon Nowling, PA-C [facility] Phone [phone] 09/22/2017 11:28 AM

## 2017-09-23 ENCOUNTER — Other Ambulatory Visit: Payer: Self-pay | Admitting: Pharmacist

## 2017-09-23 NOTE — Patient Outreach (Signed)
[facility]N) Care Management  09/23/2017  [name] 1961-03-06 [id number]  Patient was [facility] to [facility] Pharmacist by [facility] RN Davina for patient reports medication cost concerns.    Successful phone outreach to patient, HIPAA details verified.   Explained purpose of call and patient agreed to proceed.   Patient states his main concern at this time is getting his hip taken care of.  He is scheduled to have surgery 09/25/17.  He has spoken with [facility] LCSW and per note in chart, he anticipates SNF post operatively for rehab.  Patient confirms that he has not seen a PCP lately, last PCP note in this chart was Dr Excell SeltzerJ John 02/2015.  He reports he has not seen another provider for primary care needs.  He reports he is not taking any maintenance medications at this time.    Patient was encouraged to follow-up with a PCP to determine what medications, if any, are warranted.  He stated multiple times during call he wishes to get his hip taken care of first.  He stated he was told to contact his insurance, [employer], to find a PCP as well.    At this time, patient denies having pharmacy related concerns as he reported not taking any medications and not wanting to follow-up with a PCP until after he recovers from hip surgery.   The following medications were removed from active medication list in this chart as patient reported note taking: -aspirin -atorvastatin -carvedilol -diclofenac solution -gabapentin -lisinopril -paroxetine -promethazine  -chlordiazepoxide  -diphenoxylate/atropine  Plan:  Call to [facility] LCSW to request patient be re-referred if pharmacy needs arise later.   Pharmacy episode will be closed at this time due to patient report of not taking medications and denying pharmacy needs.   Tommye StandardKevin Lacrecia Delval, PharmD, [facility] Clinical Pharmacist [facility] [phone]

## 2017-09-24 NOTE — Progress Notes (Signed)
I called patient to advise surgery is being canceled for tomorrow and that patient needs to be evaluated and cleared by cardiology before he can have surgery.  I told him an appointment was made for him at Dr. Fabio BeringNishan's office on 10-22-17. Patient became very belligerent using multiple expletives.  He 1st said I was joking with him.  He then said it had to do with his insurance. I tried to explain that since he had the ECHO in 2016 and did not follow up that anesthesia wants him to follow up with cardiology to make sure he is okay to undergo surgery.  I did tell him that I understood he was hurting and would not joke about something like this.  He was very mad and ended up hanging up on me.

## 2017-09-25 ENCOUNTER — Inpatient Hospital Stay (HOSPITAL_COMMUNITY): Admission: RE | Admit: 2017-09-25 | Payer: Medicare Other | Source: Ambulatory Visit | Admitting: Orthopaedic Surgery

## 2017-09-25 ENCOUNTER — Encounter (HOSPITAL_COMMUNITY): Admission: RE | Payer: Self-pay | Source: Ambulatory Visit

## 2017-09-25 SURGERY — ARTHROPLASTY, HIP, TOTAL, ANTERIOR APPROACH
Anesthesia: Spinal | Laterality: Right

## 2017-09-26 ENCOUNTER — Other Ambulatory Visit: Payer: Self-pay | Admitting: *Deleted

## 2017-09-26 ENCOUNTER — Ambulatory Visit: Payer: Self-pay | Admitting: *Deleted

## 2017-09-26 ENCOUNTER — Telehealth (INDEPENDENT_AMBULATORY_CARE_PROVIDER_SITE_OTHER): Payer: Self-pay | Admitting: Orthopaedic Surgery

## 2017-09-26 NOTE — Telephone Encounter (Signed)
Marylu LundJanet, socialworker, for [facility] called wanting to know why the patient's surgery was cancelled.  CB#[phone].  Thank you.

## 2017-09-26 NOTE — Telephone Encounter (Signed)
He had not seen his cardiologist because he has a history of alcoholism and dilated cardiomyopathy.

## 2017-09-26 NOTE — Telephone Encounter (Signed)
Please advise 

## 2017-09-26 NOTE — Telephone Encounter (Signed)
IC advised.  

## 2017-09-26 NOTE — Patient Outreach (Signed)
[facility]N) Care Management  09/26/2017  Marcello MooresStevie E Buth 1961/10/28 [id number]   CSW attempting to reach patient by phone for follow up as he was scheduled for hip surgery 09/25/17. CSW has left 2 HIPPA compliant voicemail messages today. In review of patient's chart, it appears his scheduled surgery may have been cancelled but uncertain based on chart notes- minimal information obtainable. CSW will try to contact patient again on Monday if no return call is received.    Reece LevyJanet Macdonald Rigor, MSW, LCSW Clinical Social Worker  [facility] [phone]

## 2017-09-29 ENCOUNTER — Telehealth (INDEPENDENT_AMBULATORY_CARE_PROVIDER_SITE_OTHER): Payer: Self-pay | Admitting: Orthopaedic Surgery

## 2017-09-29 ENCOUNTER — Other Ambulatory Visit: Payer: Self-pay | Admitting: *Deleted

## 2017-09-29 NOTE — Telephone Encounter (Signed)
I called patient and advised has to be seen and cleared by cardiology before rescheduling surgery.  He will go to appt on 10-22-17 and contact me back after that to let me know status.

## 2017-09-29 NOTE — Telephone Encounter (Signed)
Patient called asking about his surgery from last Thursday that was canceled. He said he was never informed as to why it was canceled and would like to speak with you as soon as possible. CB # [phone]

## 2017-09-29 NOTE — Patient Outreach (Signed)
[facility]) Care Management  [facility] Social Work  09/29/2017  [name] [birth year] [id number]  Subjective:  "I'm not sure why they cancelled my surgery"  Objective:  CSW to assist patient with commuity based resources to aide in his well-being, quality of life and overall safety/needs.    Current Medications:  Current Outpatient Medications  Medication Sig Dispense Refill  . HYDROcodone-acetaminophen (NORCO/VICODIN) 5-325 MG tablet Take 1-2 tablets by mouth every 6 hours as needed for pain and/or cough. (Patient not taking: Reported on 09/23/2017) 11 tablet 0  . ondansetron (ZOFRAN ODT) 4 MG disintegrating tablet Take 1 tablet (4 mg total) by mouth every 8 (eight) hours as needed for nausea or vomiting. (Patient not taking: Reported on 09/16/2017) 20 tablet 0  . traMADol (ULTRAM) 50 MG tablet Take 1-2 tablets (50-100 mg total) by mouth 3 (three) times daily as needed. (Patient not taking: Reported on 09/16/2017) 30 tablet 2   No current facility-administered medications for this visit.     Functional Status:  In your present state of health, do you have any difficulty performing the following activities: 09/17/2017  Hearing? N  Vision? N  Difficulty concentrating or making decisions? N  Walking or climbing stairs? Y  Dressing or bathing? N  Some recent data might be hidden    Fall/Depression Screening:  Fall Risk  05/07/2013  Falls in the past year? No   PHQ 2/9 Scores 09/12/2017 05/07/2013  PHQ - 2 Score 3 0  PHQ- 9 Score 10 -    Assessment:  CSW made contact with patient today by phone. He verbalizes not knowing why they cancelled his hip surgery; "they didn't tell me". CSW advised patient of getting word from Ortho office that surgery planned for 11/01 was cancelled due to him not getting "Cardiology clearance".  Patient then was able to verbalize remembering they said something along those lines. CSW was then able to advise him that he has a  Cardiology Appointment for 11/28- he wrote it in his calendar and is familiar with Dr Eden Emms as his wife (deceased now) saw him in the past. Patient inquiring with CSW about returning to work; stating he doesn't know if he is ok to return to the work he was doing with the pain and surgery on hold. CSW advised him to check with Dr Warren Danes office as well as to reconnect with his PCP (has not been seen in a few years). Patient has phone numbers and will plan f/u calls to Ortho and PCP.   CSW will plan a f/u call to patient later this week for confirmation of progress with getting things in order for presurgical needs as well as to further assess.   Plan:   [facility] CM Care Plan Problem One     Most Recent Value  Care Plan Problem One  Patient's hip surgery cancelled due to not seeing PCP and/or Cardiology pre-op  Role Documenting the Problem One  Clinical Social Worker  Care Plan for Problem One  Active  [facility] Long Term Goal   Patient will be scheduled for hip surgery in the next 90 days  [facility] Long Term Goal Start Date  09/29/17  Interventions for Problem One Long Term Goal  CSW discussed the need for pateint to be complete pre-op appointments for surgery to be rescheduled.  [facility] CM Short Term Goal #1   Patient will report communication and appointment wtih PCP in the next 7 days.   [facility] CM Short Term Goal #1  Start Date  09/29/17  Interventions for Short Term Goal #1  CSW advised and provided patient with # for PCP visit pre-operatively.  [facility] CM Short Term Goal #2   Patient will secure and attend PCP and Cardiology appointments in the next 30 days.   [facility] CM Short Term Goal #2 Start Date  09/29/17  Interventions for Short Term Goal #2  CSW advised and provided infomrmation for appoiintments to be secured and will f/u to confirm.      Reece LevyJanet Zayde Stroupe, MSW, LCSW Clinical Social Worker  [facility] [phone]

## 2017-10-02 ENCOUNTER — Other Ambulatory Visit: Payer: Self-pay | Admitting: *Deleted

## 2017-10-02 NOTE — Patient Outreach (Signed)
  [facility]N) Care Management  [facility] Social [facility]  10/02/2017  [name] 28-Aug-1961 [id number]  Subjective:  "I am going to see Dr Melvyn NovasJohns on the 14th"  Objective: [facility] CSW to assist patient and family with community based resources to aide in their well-being, quality of life and overall safety and needs.    Current Medications:  Current Outpatient Medications  Medication Sig Dispense Refill  . HYDROcodone-acetaminophen (NORCO/VICODIN) 5-325 MG tablet Take 1-2 tablets by mouth every 6 hours as needed for pain and/or cough. (Patient not taking: Reported on 09/23/2017) 11 tablet 0  . ondansetron (ZOFRAN ODT) 4 MG disintegrating tablet Take 1 tablet (4 mg total) by mouth every 8 (eight) hours as needed for nausea or vomiting. (Patient not taking: Reported on 09/16/2017) 20 tablet 0  . traMADol (ULTRAM) 50 MG tablet Take 1-2 tablets (50-100 mg total) by mouth 3 (three) times daily as needed. (Patient not taking: Reported on 09/16/2017) 30 tablet 2   No current facility-administered medications for this visit.     Functional Status:  In your present state of health, do you have any difficulty performing the following activities: 09/17/2017  Hearing? N  Vision? N  Difficulty concentrating or making decisions? N  Walking or climbing stairs? Y  Dressing or bathing? N  Some recent data might be hidden    Fall/Depression Screening:  Fall Risk  05/07/2013  Falls in the past year? No   PHQ 2/9 Scores 09/12/2017 05/07/2013  PHQ - 2 Score 3 0  PHQ- 9 Score 10 -    Assessment:  CSW spoke with patient today who reports he is going to see his PCP on 11/14 at 1:30. He also has cardiology visit scheduled for 11/28- both appointments to be completed before his hip surgery can be performed. Patient drives and has written appointments down with plans to drive self. He is concerned about costs of medications that his PCP may put him (back) on as he was on some medications but no  longer takes. It appears he may not have seen his PCP in 2 years, and I have advised him of plans for f/u call after his PCP visit as well as plans to have [facility] RPH reach out to him as well to assess and assist with RX cost if eligible/a  Plan:

## 2017-10-07 NOTE — Patient Outreach (Signed)
  [facility]) Care Management  [facility] Social Work  10/07/2017  Marcello MooresStevie E Krontz 1961/05/29 [id number]  Subjective:  "I go see my Doctor tomorrow". Objective: [facility] CSW to assist patient and family with community based resources to aide in their well-being, quality of life and overall safety and needs.     Current Medications:  Current Outpatient Medications  Medication Sig Dispense Refill  . HYDROcodone-acetaminophen (NORCO/VICODIN) 5-325 MG tablet Take 1-2 tablets by mouth every 6 hours as needed for pain and/or cough. (Patient not taking: Reported on 09/23/2017) 11 tablet 0  . ondansetron (ZOFRAN ODT) 4 MG disintegrating tablet Take 1 tablet (4 mg total) by mouth every 8 (eight) hours as needed for nausea or vomiting. (Patient not taking: Reported on 09/16/2017) 20 tablet 0  . traMADol (ULTRAM) 50 MG tablet Take 1-2 tablets (50-100 mg total) by mouth 3 (three) times daily as needed. (Patient not taking: Reported on 09/16/2017) 30 tablet 2   No current facility-administered medications for this visit.     Functional Status:  In your present state of health, do you have any difficulty performing the following activities: 09/17/2017  Hearing? N  Vision? N  Difficulty concentrating or making decisions? N  Walking or climbing stairs? Y  Dressing or bathing? N  Some recent data might be hidden    Fall/Depression Screening:  Fall Risk  05/07/2013  Falls in the past year? No   PHQ 2/9 Scores 09/12/2017 05/07/2013  PHQ - 2 Score 3 0  PHQ- 9 Score 10 -    Assessment: CSW contacted patient  ient Plan:

## 2017-10-08 ENCOUNTER — Ambulatory Visit (INDEPENDENT_AMBULATORY_CARE_PROVIDER_SITE_OTHER): Payer: Medicare Other | Admitting: Internal Medicine

## 2017-10-08 ENCOUNTER — Telehealth (INDEPENDENT_AMBULATORY_CARE_PROVIDER_SITE_OTHER): Payer: Self-pay | Admitting: Orthopaedic Surgery

## 2017-10-08 ENCOUNTER — Encounter: Payer: Self-pay | Admitting: Internal Medicine

## 2017-10-08 ENCOUNTER — Other Ambulatory Visit (INDEPENDENT_AMBULATORY_CARE_PROVIDER_SITE_OTHER): Payer: Self-pay

## 2017-10-08 VITALS — BP 130/88 | HR 98 | Temp 97.6°F | Ht 68.0 in | Wt 207.0 lb

## 2017-10-08 DIAGNOSIS — N390 Urinary tract infection, site not specified: Secondary | ICD-10-CM

## 2017-10-08 DIAGNOSIS — Z1159 Encounter for screening for other viral diseases: Secondary | ICD-10-CM | POA: Diagnosis not present

## 2017-10-08 DIAGNOSIS — R8281 Pyuria: Secondary | ICD-10-CM

## 2017-10-08 DIAGNOSIS — Z0000 Encounter for general adult medical examination without abnormal findings: Secondary | ICD-10-CM

## 2017-10-08 DIAGNOSIS — Z114 Encounter for screening for human immunodeficiency virus [HIV]: Secondary | ICD-10-CM

## 2017-10-08 DIAGNOSIS — Z01818 Encounter for other preprocedural examination: Secondary | ICD-10-CM | POA: Diagnosis not present

## 2017-10-08 LAB — URINALYSIS, ROUTINE W REFLEX MICROSCOPIC
BILIRUBIN URINE: NEGATIVE
Hgb urine dipstick: NEGATIVE
KETONES UR: NEGATIVE
LEUKOCYTES UA: NEGATIVE
Nitrite: NEGATIVE
PH: 6 (ref 5.0–8.0)
RBC / HPF: NONE SEEN (ref 0–?)
SPECIFIC GRAVITY, URINE: 1.025 (ref 1.000–1.030)
TOTAL PROTEIN, URINE-UPE24: NEGATIVE
UROBILINOGEN UA: 0.2 (ref 0.0–1.0)
Urine Glucose: NEGATIVE

## 2017-10-08 LAB — PSA: PSA: 1.4 ng/mL (ref 0.10–4.00)

## 2017-10-08 NOTE — Telephone Encounter (Signed)
Deuser,Davonne E Aug 20, 1961    Ruthe MannanJanet Coaldwell [facility] (Child psychotherapistsocial worker)  [phone]   Please call to discuss social worker to discuss pt and update his appt and resched surgery.

## 2017-10-08 NOTE — Progress Notes (Signed)
Subjective:    Patient ID: Shawn MooresStevie E Lesueur, male    DOB: 12-31-1960, 56 y.o.   MRN: [id number]  HPI  Here for wellness and preop evaluation  Overall doing ok;  Pt denies Chest pain, worsening SOB, DOE, wheezing, orthopnea, PND, worsening LE edema, palpitations, dizziness or syncope.  Pt denies neurological change such as new headache, facial or extremity weakness.  Pt denies polydipsia, polyuria, or low sugar symptoms. Pt states overall good compliance with treatment and medications, good tolerability, and has been trying to follow appropriate diet.  Pt denies worsening depressive symptoms, suicidal ideation or panic. No fever, night sweats, wt loss, loss of appetite, or other constitutional symptoms.  Pt states fair ability with ADL's, has low fall risk, home safety reviewed and adequate, no other significant changes in hearing or vision, and only occasionally active with exercise.  Due for flu shot, Tdap, and colonoscopy, Hep C, PSA and HIV.  Other routine labs are recetnly done preop   Pt states he missed prior card evaluation due to DUI conviction/incarceration.  Still drinks on weekends only  - 1 pint only, and not every weekend. Dr Roda ShuttersXu wanted to have complete medical evaluation here and with Dr Nishan/cardiology before proceeding with right hip surgury.   Routine UA preop showed marked pyyruai, and pt has completed 4 days antibx.  Denies urinary symptoms such as dysuria, frequency, urgency, flank pain, hematuria or n/v, fever, chills. Past Medical History:  Diagnosis Date  . Alcohol abuse   . Anxiety    Past Surgical History:  Procedure Laterality Date  . APPENDECTOMY    . HERNIA REPAIR    . LEFT TOTAL HIP ARTHROPLASTY Left 10/15/2013   Performed by Thera Flakeaffrey, W D Jr., [facility]    reports that he has quit smoking. His smoking use included cigarettes. He has a 1.00 pack-year smoking history. he has never used smokeless tobacco. He reports that he drinks alcohol. He reports that he does not  use drugs. family history includes Colon cancer in his brother; Diabetes in his paternal uncle; Liver cancer in his brother. No Known Allergies Current Outpatient Medications on File Prior to Visit  Medication Sig Dispense Refill  . HYDROcodone-acetaminophen (NORCO/VICODIN) 5-325 MG tablet Take 1-2 tablets by mouth every 6 hours as needed for pain and/or cough. 11 tablet 0  . ondansetron (ZOFRAN ODT) 4 MG disintegrating tablet Take 1 tablet (4 mg total) by mouth every 8 (eight) hours as needed for nausea or vomiting. 20 tablet 0  . traMADol (ULTRAM) 50 MG tablet Take 1-2 tablets (50-100 mg total) by mouth 3 (three) times daily as needed. 30 tablet 2   No current facility-administered medications on file prior to visit.    Review of Systems Constitutional: Negative for other unusual diaphoresis, sweats, appetite or weight changes HENT: Negative for other worsening hearing loss, ear pain, facial swelling, mouth sores or neck stiffness.   Eyes: Negative for other worsening pain, redness or other visual disturbance.  Respiratory: Negative for other stridor or swelling Cardiovascular: Negative for other palpitations or other chest pain  Gastrointestinal: Negative for worsening diarrhea or loose stools, blood in stool, distention or other pain Genitourinary: Negative for hematuria, flank pain or other change in urine volume.  Musculoskeletal: Negative for myalgias or other joint swelling.  Skin: Negative for other color change, or other wound or worsening drainage.  Neurological: Negative for other syncope or numbness. Hematological: Negative for other adenopathy or swelling Psychiatric/Behavioral: Negative for hallucinations, other worsening  agitation, SI, self-injury, or new decreased concentration All other system neg per pt    Objective:   Physical Exam BP 130/88   Pulse 98   Temp 97.6 F (36.4 C) (Oral)   Ht 5\' 8"  (1.727 m)   Wt 207 lb (93.9 kg)   SpO2 99%   BMI 31.47 kg/m  VS  noted,  Constitutional: Pt is oriented to person, place, and time. Appears well-developed and well-nourished, in no significant distress and comfortable Head: Normocephalic and atraumatic  Eyes: Conjunctivae and EOM are normal. Pupils are equal, round, and reactive to light Right Ear: External ear normal without discharge Left Ear: External ear normal without discharge Nose: Nose without discharge or deformity Mouth/Throat: Oropharynx is without other ulcerations and moist  Neck: Normal range of motion. Neck supple. No JVD present. No tracheal deviation present or significant neck LA or mass Cardiovascular: Normal rate, regular rhythm, normal heart sounds and intact distal pulses.   Pulmonary/Chest: WOB normal and breath sounds without rales or wheezing  Abdominal: Soft. Bowel sounds are normal. NT. No HSM  Musculoskeletal: Normal range of motion. Exhibits no edema Lymphadenopathy: Has no other cervical adenopathy.  Neurological: Pt is alert and oriented to person, place, and time. Pt has normal reflexes. No cranial nerve deficit. Motor grossly intact, Gait intact Skin: Skin is warm and dry. o rash noted or new ulcerations Psychiatric:  Has normal mood and affect. Behavior is normal without agitation No other exam findings=    Assessment & Plan:

## 2017-10-08 NOTE — Patient Instructions (Addendum)
You had the flu shot and Tetanus Tdap shots today  Please continue all other medications as before, and refills have been done if requested.  Please have the pharmacy call with any other refills you may need.  Please continue your efforts at being more active, low cholesterol diet, and weight control.  You are otherwise up to date with prevention measures today.  Please keep your appointments with your specialists as you may have planned - Dr Eden EmmsNIshan on Nov 28  You will be contacted regarding the referral for: colonoscopy (for 6 mo from now, so you have plenty of time to get past the surgury)  Please go to the LAB in the Basement (turn left off the elevator) for the tests to be done today  You will be contacted by phone if any changes need to be made immediately.  Otherwise, you will receive a letter about your results with an explanation, but please check with MyChart first.  Please return in 6 months, or sooner if needed

## 2017-10-09 ENCOUNTER — Other Ambulatory Visit: Payer: Self-pay | Admitting: *Deleted

## 2017-10-09 ENCOUNTER — Inpatient Hospital Stay (INDEPENDENT_AMBULATORY_CARE_PROVIDER_SITE_OTHER): Payer: Medicare Other | Admitting: Orthopaedic Surgery

## 2017-10-09 LAB — HIV ANTIBODY (ROUTINE TESTING W REFLEX): HIV 1&2 Ab, 4th Generation: NONREACTIVE

## 2017-10-09 LAB — URINE CULTURE
MICRO NUMBER:: 81284072
Result:: NO GROWTH
SPECIMEN QUALITY:: ADEQUATE

## 2017-10-09 LAB — HEPATITIS C ANTIBODY
Hepatitis C Ab: NONREACTIVE
SIGNAL TO CUT-OFF: 0.01 (ref <1.00)

## 2017-10-09 NOTE — Telephone Encounter (Signed)
Wants to R/S Surgery.

## 2017-10-09 NOTE — Patient Outreach (Signed)
[name]) Care Management  [facility] Social Work  10/09/2017  [name] Nov 28, 1960 [id number]  Subjective:  Patient awaiting hip surgery until seen by PCP and Cardiology.  Objective: [facility] CSW to assist patient and family with community based resources to aide in their well-being, quality of life and overall safety and needs.    Current Medications:  Current Outpatient Medications  Medication Sig Dispense Refill  . HYDROcodone-acetaminophen (NORCO/VICODIN) 5-325 MG tablet Take 1-2 tablets by mouth every 6 hours as needed for pain and/or cough. 11 tablet 0  . ondansetron (ZOFRAN ODT) 4 MG disintegrating tablet Take 1 tablet (4 mg total) by mouth every 8 (eight) hours as needed for nausea or vomiting. 20 tablet 0  . traMADol (ULTRAM) 50 MG tablet Take 1-2 tablets (50-100 mg total) by mouth 3 (three) times daily as needed. 30 tablet 2   No current facility-administered medications for this visit.     Functional Status:  In your present state of health, do you have any difficulty performing the following activities: 09/17/2017  Hearing? N  Vision? N  Difficulty concentrating or making decisions? N  Walking or climbing stairs? Y  Dressing or bathing? N  Some recent data might be hidden    Fall/Depression Screening:  Fall Risk  10/08/2017 05/07/2013  Falls in the past year? Yes No  Number falls in past yr: 1 -  Injury with Fall? No -   PHQ 2/9 Scores 10/08/2017 09/12/2017 05/07/2013  PHQ - 2 Score 0 3 0  PHQ- 9 Score - 10 -    Assessment: CSW received call from patient on yesterday pm stating a call he received from Orthopaedic Surgeons office about appointment for 10/10/17. CSW contacted their office and was awaiting a callback. CSW called patient back today to advise of awaiting response and he indicated he did go to the appointment this morning. "They said they don't need to see me until I see the heart doctor". Patient was seen by PCP earlier this week  per patient report and only medication he is taking is Tramadol. He states he has filled that prescription. He remains concerned about the cost of any medications that may be prescribed (at Cardiology appointment 11/28) and is encouraged to call CSW back after that visit. CSW will update [facility] RPH of above and potential need for support.  Patient denies any other needs or concerns at this time. He is hopeful to get his hip surgery done as soon as possible after Cardiology clearance and is antcipating/plannign for SNF rehab post operatively.    Plan:  [facility] CM Care Plan Problem One     Most Recent Value  Care Plan Problem One  Patient's hip surgery cancelled due to not seeing PCP and/or Cardiology pre-op  Role Documenting the Problem One  Clinical Social Worker  Care Plan for Problem One  Active  [facility] Long Term Goal   Patient will be scheduled for hip surgery in the next 90 days  [facility] Long Term Goal Start Date  09/29/17  Interventions for Problem One Long Term Goal  CSW discussed the need for pateint to be complete pre-op appointments for surgery to be rescheduled.  [facility] CM Short Term Goal #1   Patient will report communication and appointment wtih PCP in the next 7 days.   [facility] CM Short Term Goal #1 Start Date  09/29/17  [facility] CM Short Term Goal #1 Met Date  10/08/17  Interventions for Short Term Goal #1  CSW advised  and provided patient with # for PCP visit pre-operatively.  [facility] CM Short Term Goal #2   Patient will secure and attend PCP and Cardiology appointments in the next 30 days.   [facility] CM Short Term Goal #2 Start Date  09/29/17  Interventions for Short Term Goal #2  CSW advised and provided infomrmation for appoiintments to be secured and will f/u to confirm.        [name], MSW, [place] Worker  [place] [phone]

## 2017-10-11 ENCOUNTER — Encounter: Payer: Self-pay | Admitting: Internal Medicine

## 2017-10-11 DIAGNOSIS — Z01818 Encounter for other preprocedural examination: Secondary | ICD-10-CM | POA: Insufficient documentation

## 2017-10-11 NOTE — Assessment & Plan Note (Signed)

## 2017-10-11 NOTE — Assessment & Plan Note (Signed)
Asympt, for f/u urine studies 

## 2017-10-11 NOTE — Assessment & Plan Note (Signed)
Ok and cleared for surgury from IM standpoint pending lab results

## 2017-10-22 ENCOUNTER — Ambulatory Visit: Payer: Medicare Other | Admitting: Cardiology

## 2017-10-22 ENCOUNTER — Encounter: Payer: Self-pay | Admitting: Cardiology

## 2017-10-22 VITALS — BP 136/88 | HR 76 | Ht 68.0 in | Wt 213.0 lb

## 2017-10-22 DIAGNOSIS — Z720 Tobacco use: Secondary | ICD-10-CM

## 2017-10-22 DIAGNOSIS — I426 Alcoholic cardiomyopathy: Secondary | ICD-10-CM | POA: Diagnosis not present

## 2017-10-22 DIAGNOSIS — Z01818 Encounter for other preprocedural examination: Secondary | ICD-10-CM

## 2017-10-22 NOTE — Patient Instructions (Signed)
Your physician recommends that you continue on your current medications as directed. Please refer to the Current Medication list given to you today.   Your physician has requested that you have an echocardiogram. Echocardiography is a painless test that uses sound waves to create images of your heart. It provides your doctor with information about the size and shape of your heart and how well your heart's chambers and valves are working. This procedure takes approximately one hour. There are no restrictions for this procedure. AS SOON AS POSSIBLE   Your physician recommends that you schedule a follow-up appointment in:  3 MONTHS WITH DR Eden EmmsNISHAN

## 2017-10-22 NOTE — Progress Notes (Signed)
Cardiology Office Note   Date:  10/22/2017   ID:  [name], DOB 04-Mar-1961, MRN [id number]  PCP:  [name], [name], MD  Cardiologist:  Dr. Eden EmmsNishan    Chief Complaint  Patient presents with  . Pre-op Exam      History of Present Illness: [name] is a 56 y.o. male who presents for pre-op exam for Rt hip surgery.  Dr. Roda ShuttersXu is ortho.   He has a hx of syncope and ETOH use.  Thought to be seizures.  Also HLD.  Echo in 2016 with EF 35-40% pt was placed on lisinopril and coreg and was to have a cardiac cath but did not follow up. Needs complete cardiac work up prior to surgery.   Tobacco rare for stress ETOH a few beers on the weekend only.    Today pt explains he did not return for cath due to incarceration for 1.5 years.  He did not drink in those years.  He never took the meds prescribed.  He has no chest pain or SOB, he can walk without chest pain or SOB but has difficulty due to right hip pain and he has Lt hip pain from previous Lt hip surgery.   No DM. No HTN- he denies drug use.   No swelling.   Past Medical History:  Diagnosis Date  . Alcohol abuse   . Anxiety     Past Surgical History:  Procedure Laterality Date  . APPENDECTOMY    . HERNIA REPAIR    . TOTAL HIP ARTHROPLASTY Left 10/15/2013   Procedure: LEFT TOTAL HIP ARTHROPLASTY;  Surgeon: Thera FlakeW D Caffrey Jr., MD;  Location: [facility] OR;  Service: Orthopedics;  Laterality: Left;     Current Outpatient Medications  Medication Sig Dispense Refill  . HYDROcodone-acetaminophen (NORCO/VICODIN) 5-325 MG tablet Take 1-2 tablets by mouth every 6 hours as needed for pain and/or cough. 11 tablet 0  . traMADol (ULTRAM) 50 MG tablet Take 1-2 tablets (50-100 mg total) by mouth 3 (three) times daily as needed. 30 tablet 2   No current facility-administered medications for this visit.     Allergies:   Patient has no known allergies.    Social History:  The patient  reports that he has been smoking cigarettes.  He has a 1.00  pack-year smoking history. he has never used smokeless tobacco. He reports that he drinks about 1.8 oz of alcohol per week. He reports that he does not use drugs.   Family History:  The patient's family history includes Colon cancer in his brother; Diabetes in his paternal uncle; Heart failure in his brother; Liver cancer in his brother.    ROS:  General:no colds or fevers, no weight changes Skin:no rashes or ulcers HEENT:no blurred vision, no congestion CV:see HPI PUL:see HPI GI:no diarrhea constipation or melena, no indigestion GU:no hematuria, no dysuria MS:no joint pain, no claudication Neuro:no syncope, no lightheadedness Endo:no diabetes, no thyroid disease  Wt Readings from Last 3 Encounters:  10/22/17 213 lb (96.6 kg)  10/08/17 207 lb (93.9 kg)  09/17/17 204 lb 12.8 oz (92.9 kg)     PHYSICAL EXAM: VS:  BP 136/88   Pulse 76   Ht 5\' 8"  (1.727 m)   Wt 213 lb (96.6 kg)   BMI 32.39 kg/m  , BMI Body mass index is 32.39 kg/m. General:Pleasant affect, NAD Skin:Warm and dry, brisk capillary refill HEENT:normocephalic, sclera clear, mucus membranes moist Neck:supple, no JVD, no bruits  Heart:S1S2 RRR without murmur,  gallup, rub or click Lungs:clear without rales, rhonchi, or wheezes ZOX:WRUEAbd:soft, non tender, + BS, do not palpate liver spleen or masses Ext:no lower ext edema, 2+ pedal pulses, 2+ radial pulses Neuro:alert and oriented, MAE, follows commands, + facial symmetry    EKG:  EKG is ordered today. The ekg ordered today demonstrates SR normal EKG    Recent Labs: 09/17/2017: ALT 20; BUN 11; Creatinine, Ser 0.80; Hemoglobin 14.7; Platelets 278; Potassium 3.8; Sodium 135    Lipid Panel    Component Value Date/Time   CHOL 281 (H) 02/20/2015 0943   TRIG 175.0 (H) 02/20/2015 0943   HDL 84.00 02/20/2015 0943   CHOLHDL 3 02/20/2015 0943   VLDL 35.0 02/20/2015 0943   LDLCALC 162 (H) 02/20/2015 0943   LDLDIRECT 109.8 07/22/2014 1656       Other studies  Reviewed: Additional studies/ records that were reviewed today include:  Echo 03/23/17  Study Conclusions  - Left ventricle: The cavity size was moderately dilated. Wall   thickness was normal. Systolic function was moderately reduced.   The estimated ejection fraction was in the range of 35% to 40%.   Diffuse hypokinesis. - Left atrium: The atrium was mildly dilated. - Atrial septum: No defect or patent foramen ovale was identified.  ASSESSMENT AND PLAN:  1.  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  Now a few beers on weekend no chest pain or SOB and no edema.  His activity is restricted due to his hip pain.  Discussed with Dr. Excell Seltzerooper DOD, will do echo and if normal no further testing and he would be cleared for surgery at low risk.   If abnormal will plan for cardiac CTA with FFR.  Then clear if stable.    2.  Pre-op exam.  As above.    3.  Tobacco use and ETOH use minimal at this time.  Encouraged to stop.      Current medicines are reviewed with the patient today.  The patient Has no concerns regarding medicines.  The following changes have been made:  See above Labs/ tests ordered today include:see above  Disposition:   FU:  see above  Signed, Nada BoozerLaura Reason Helzer, NP  10/22/2017 2:01 PM    [facility] [address], [place], [place]  [address]/ [address] [place], [place] Phone: [phone](336) [phone]; Fax: [phone](336) [phone]  [phone]

## 2017-10-23 ENCOUNTER — Other Ambulatory Visit: Payer: Self-pay | Admitting: *Deleted

## 2017-10-24 ENCOUNTER — Other Ambulatory Visit: Payer: Self-pay

## 2017-10-24 ENCOUNTER — Ambulatory Visit (HOSPITAL_COMMUNITY): Payer: Medicare Other | Attending: Cardiology

## 2017-10-24 DIAGNOSIS — I426 Alcoholic cardiomyopathy: Secondary | ICD-10-CM

## 2017-10-24 DIAGNOSIS — I5030 Unspecified diastolic (congestive) heart failure: Secondary | ICD-10-CM | POA: Diagnosis not present

## 2017-10-24 DIAGNOSIS — I058 Other rheumatic mitral valve diseases: Secondary | ICD-10-CM | POA: Diagnosis not present

## 2017-10-24 DIAGNOSIS — Z01818 Encounter for other preprocedural examination: Secondary | ICD-10-CM | POA: Insufficient documentation

## 2017-10-24 DIAGNOSIS — I420 Dilated cardiomyopathy: Secondary | ICD-10-CM | POA: Insufficient documentation

## 2017-10-24 NOTE — Patient Outreach (Signed)
[facility]) Care Management  10/24/2017  Shawn MooresStevie E Smith Jun 14, 1961 [id number]   CSW spoke with patient by phone on 11/29 and he reports he went to the Cardiologist and was sent for an ultrasound today. "They expect to have the results soon and will seInt them to the orthopaedic doctor.  " I just hope I can have the surgery in December because I'm in so much pain".  CSW will plan a f/u call next week for updates and to assist with ensuring communication for hip surgery to be rescheduled.    Reece LevyJanet Yehya Brendle, MSW, LCSW Clinical Social Worker  [facility] [phone]

## 2017-10-27 ENCOUNTER — Telehealth (INDEPENDENT_AMBULATORY_CARE_PROVIDER_SITE_OTHER): Payer: Self-pay | Admitting: Orthopaedic Surgery

## 2017-10-27 NOTE — Telephone Encounter (Signed)
Yes please. He's been cleared by cardiology. Thanks.  ASAP please.

## 2017-10-27 NOTE — Telephone Encounter (Signed)
° °  Pt stated he was seen at the heart center on 10/24/17. Pt wanted to know if his information was faxed to the office. Pt wanted to know so he could be cleared for surgery. Hip replacement

## 2017-10-27 NOTE — Telephone Encounter (Signed)
Do you want to proceed with scheduling patient?

## 2017-10-28 NOTE — Telephone Encounter (Signed)
Spoke with patient and rescheduled surgery for 11-12-17.

## 2017-10-29 ENCOUNTER — Other Ambulatory Visit (INDEPENDENT_AMBULATORY_CARE_PROVIDER_SITE_OTHER): Payer: Self-pay

## 2017-11-04 ENCOUNTER — Other Ambulatory Visit (INDEPENDENT_AMBULATORY_CARE_PROVIDER_SITE_OTHER): Payer: Self-pay | Admitting: Orthopaedic Surgery

## 2017-11-04 DIAGNOSIS — M1611 Unilateral primary osteoarthritis, right hip: Secondary | ICD-10-CM

## 2017-11-05 ENCOUNTER — Other Ambulatory Visit: Payer: Self-pay

## 2017-11-05 ENCOUNTER — Encounter (HOSPITAL_COMMUNITY)
Admission: RE | Admit: 2017-11-05 | Discharge: 2017-11-05 | Disposition: A | Discharge status: Home / Self Care | Payer: Medicare Other | Source: Ambulatory Visit | Attending: Orthopaedic Surgery | Admitting: Orthopaedic Surgery

## 2017-11-05 ENCOUNTER — Encounter (HOSPITAL_COMMUNITY): Payer: Self-pay

## 2017-11-05 DIAGNOSIS — F419 Anxiety disorder, unspecified: Secondary | ICD-10-CM | POA: Insufficient documentation

## 2017-11-05 DIAGNOSIS — F1010 Alcohol abuse, uncomplicated: Secondary | ICD-10-CM | POA: Insufficient documentation

## 2017-11-05 DIAGNOSIS — E669 Obesity, unspecified: Secondary | ICD-10-CM | POA: Insufficient documentation

## 2017-11-05 DIAGNOSIS — Z9889 Other specified postprocedural states: Secondary | ICD-10-CM | POA: Insufficient documentation

## 2017-11-05 DIAGNOSIS — Z01812 Encounter for preprocedural laboratory examination: Secondary | ICD-10-CM | POA: Diagnosis not present

## 2017-11-05 DIAGNOSIS — Z87891 Personal history of nicotine dependence: Secondary | ICD-10-CM | POA: Insufficient documentation

## 2017-11-05 LAB — URINALYSIS, ROUTINE W REFLEX MICROSCOPIC
Bilirubin Urine: NEGATIVE
GLUCOSE, UA: NEGATIVE mg/dL
Hgb urine dipstick: NEGATIVE
KETONES UR: NEGATIVE mg/dL
LEUKOCYTES UA: NEGATIVE
NITRITE: NEGATIVE
PROTEIN: NEGATIVE mg/dL
Specific Gravity, Urine: 1.019 (ref 1.005–1.030)
pH: 7 (ref 5.0–8.0)

## 2017-11-05 LAB — COMPREHENSIVE METABOLIC PANEL
ALT: 25 U/L (ref 17–63)
ANION GAP: 10 (ref 5–15)
AST: 27 U/L (ref 15–41)
Albumin: 4.3 g/dL (ref 3.5–5.0)
Alkaline Phosphatase: 81 U/L (ref 38–126)
BILIRUBIN TOTAL: 0.6 mg/dL (ref 0.3–1.2)
BUN: 10 mg/dL (ref 6–20)
CALCIUM: 9.4 mg/dL (ref 8.9–10.3)
CHLORIDE: 99 mmol/L — AB (ref 101–111)
CO2: 26 mmol/L (ref 22–32)
Creatinine, Ser: 0.9 mg/dL (ref 0.61–1.24)
Glucose, Bld: 93 mg/dL (ref 65–99)
POTASSIUM: 3.9 mmol/L (ref 3.5–5.1)
Sodium: 135 mmol/L (ref 135–145)
Total Protein: 7.6 g/dL (ref 6.5–8.1)

## 2017-11-05 LAB — CBC WITH DIFFERENTIAL/PLATELET
BASOS ABS: 0 10*3/uL (ref 0.0–0.1)
Basophils Relative: 0 %
EOS PCT: 2 %
Eosinophils Absolute: 0.1 10*3/uL (ref 0.0–0.7)
HCT: 46.1 % (ref 39.0–52.0)
Hemoglobin: 15.3 g/dL (ref 13.0–17.0)
LYMPHS PCT: 27 %
Lymphs Abs: 2.3 10*3/uL (ref 0.7–4.0)
MCH: 29.9 pg (ref 26.0–34.0)
MCHC: 33.2 g/dL (ref 30.0–36.0)
MCV: 90 fL (ref 78.0–100.0)
MONO ABS: 0.6 10*3/uL (ref 0.1–1.0)
MONOS PCT: 7 %
Neutro Abs: 5.5 10*3/uL (ref 1.7–7.7)
Neutrophils Relative %: 64 %
PLATELETS: 230 10*3/uL (ref 150–400)
RBC: 5.12 MIL/uL (ref 4.22–5.81)
RDW: 13.8 % (ref 11.5–15.5)
WBC: 8.6 10*3/uL (ref 4.0–10.5)

## 2017-11-05 LAB — SURGICAL PCR SCREEN
MRSA, PCR: NEGATIVE
STAPHYLOCOCCUS AUREUS: NEGATIVE

## 2017-11-05 LAB — TYPE AND SCREEN
ABO/RH(D): O POS
Antibody Screen: NEGATIVE

## 2017-11-05 LAB — PROTIME-INR
INR: 1
Prothrombin Time: 13.1 seconds (ref 11.4–15.2)

## 2017-11-05 LAB — APTT: APTT: 28 s (ref 24–36)

## 2017-11-05 LAB — SEDIMENTATION RATE: Sed Rate: 3 mm/hr (ref 0–16)

## 2017-11-05 LAB — C-REACTIVE PROTEIN: CRP: <0.8 mg/dL (ref <1.0)

## 2017-11-05 NOTE — Pre-Procedure Instructions (Signed)
Elgie CollardStevie E Letendre  11/05/2017      [id number] [place]- [place], [place] - [address] [place] [id number] Phone: [phone] Fax: [phone]    Your procedure is scheduled on December 19  [facility] Admitting at 1120 A.M.  Call this number if you have problems the morning of surgery:  [phone]   Remember:  Do not eat food or drink liquids after midnight.  Continue all medications as directed by your physician except follow these medication instructions before surgery below   Take these medicines the morning of surgery with A SIP OF WATER NONE  7 days prior to surgery STOP taking any Aspirin(unless otherwise instructed by your surgeon), Aleve, Naproxen, Ibuprofen, Motrin, Advil, Goody's, BC's, all herbal medications, fish oil, and all vitamins    Do not wear jewelry  Do not wear lotions, powders, or cologne, or deodorant.  Men may shave face and neck.  Do not bring valuables to the hospital.  [facility] is not responsible for any belongings or valuables.  Contacts, dentures or bridgework may not be worn into surgery.  Leave your suitcase in the car.  After surgery it may be brought to your room.  For patients admitted to the hospital, discharge time will be determined by your treatment team.  Patients discharged the day of surgery will not be allowed to drive home.    Special instructions:   [place]- Preparing For Surgery  Before surgery, you can play an important role. Because skin is not sterile, your skin needs to be as free of germs as possible. You can reduce the number of germs on your skin by washing with CHG (chlorahexidine gluconate) Soap before surgery.  CHG is an antiseptic cleaner which kills germs and bonds with the skin to continue killing germs even after washing.  Please do not use if you have an allergy to CHG or antibacterial soaps. If your skin becomes reddened/irritated stop using the CHG.   Do not shave (including legs and underarms) for at least 48 hours prior to first CHG shower. It is OK to shave your face.  Please follow these instructions carefully.   1. Shower the NIGHT BEFORE SURGERY and the MORNING OF SURGERY with CHG.   2. If you chose to wash your hair, wash your hair first as usual with your normal shampoo.  3. After you shampoo, rinse your hair and body thoroughly to remove the shampoo.  4. Use CHG as you would any other liquid soap. You can apply CHG directly to the skin and wash gently with a scrungie or a clean washcloth.   5. Apply the CHG Soap to your body ONLY FROM THE NECK DOWN.  Do not use on open wounds or open sores. Avoid contact with your eyes, ears, mouth and genitals (private parts). Wash Face and genitals (private parts)  with your normal soap.  6. Wash thoroughly, paying special attention to the area where your surgery will be performed.  7. Thoroughly rinse your body with warm water from the neck down.  8. DO NOT shower/wash with your normal soap after using and rinsing off the CHG Soap.  9. Pat yourself dry with a CLEAN TOWEL.  10. Wear CLEAN PAJAMAS to bed the night before surgery, wear comfortable clothes the morning of surgery  11. Place CLEAN SHEETS on your bed the night of your first shower and DO NOT SLEEP WITH PETS.    Day of Surgery:  Do not apply any deodorants/lotions. Please wear clean clothes to the hospital/surgery center.      Please read over the following fact sheets that you were given.

## 2017-11-05 NOTE — Progress Notes (Signed)
PCP - [name] Cardiologist - Nishan  Chest x-ray - not needed  EKG - 10/22/17 Stress Test - denies ECHO - 2018 Cardiac Cath denies-   Anesthesia review: Yes, cardiomyopathy hx  Patient denies shortness of breath, fever, cough and chest pain at PAT appointment   Patient verbalized understanding of instructions that were given to them at the PAT appointment. Patient was also instructed that they will need to review over the PAT instructions again at home before surgery.

## 2017-11-05 NOTE — Progress Notes (Deleted)
CRITICAL VALUE ALERT  Critical Value:  K 2.7  Date & Time Notied:  2:10 PM   Provider Notified: Anglea NP, Dr. Laurelyn SickleBylerly office called  Orders Received/Actions taken:

## 2017-11-06 NOTE — Progress Notes (Signed)
Anesthesia Chart Review: Patient is a 56 year old male scheduled for right THA, anterior approach on 11/12/17 by 09/25/17 by Dr. Gershon MusselNaiping Xu. Surgery was initially scheduled for 09/25/17, but was postponed for cardiology preoperative evaluation for follow-up cardiomyopathy from 2016. Patient [facility] with follow-up echo showing normal LVEF. He was subsequently cleared for surgery from a cardiac standpoint.   History includes smoking, ETOH abuse (now reports only occasional use), anxiety, appendectomy, hernia repair, left THA 10/15/13. 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 BMI is consistent with mild obesity.   He is not on any routine medications.   - PCP is Dr. [name], last visit 10/08/17. He cleared patient for surgery from a medical standpoint pending lab results. - Cardiologist is Dr. Charlton HawsPeter Nishan. He initially saw him on 03/21/15 for syncope thought likely related to seizure in the setting of ETOH abuse. Echo showed EF 35-40% at that time. Patient was to follow-up for possible cardiac cath but was a no show. Apparently patient was incarcerated for ~ 1.5 years. He was re-established on 10/22/17 with Nada BoozerLaura Ingold, NP for pre-operative evaluation. His ETOH intake had decreased. She discussed with Dr. Excell Seltzerooper (covering cardiologist) and plant for echo and if normal then no further testing recommended and could be cleared for surgery at low risk. Echo done on 10/24/17 (see below), and he was subsequently cleared for surgery. Future follow-up planned with Dr. Eden EmmsNishan.   BP 123/88   Pulse 88   Temp 36.8 C   Resp 20   Ht 5\' 9"  (1.753 m)   Wt 204 lb 9.6 oz (92.8 kg)   SpO2 95%   BMI 30.21 kg/m   EKG 10/23/27: NSR.  Echo 10/24/17: Study Conclusions - Left ventricle: The cavity size was normal. Wall thickness was   normal. Systolic function was normal. The estimated ejection   fraction was in the range  of 55% to 60%. Wall motion was normal;   there were no regional wall motion abnormalities. Doppler   parameters are consistent with abnormal left ventricular   relaxation (grade 1 diastolic dysfunction). - Aortic root: The aortic root was mildly dilated. (40 mm) - Mitral valve: Mild prolapse, involving the posterior leaflet. Impressions: - Normal LV systolic function; mild diastolic dysfunction; mildly   dilated aortic root; mild prolapse of posterior MV leaflet with   trace MR. (Previous echo 03/24/15 showed EF 35-40%, diffuse hypokinesis.)  Preoperative labs noted. CBC, PT/PTT, sed rate, UA and CRP WNL. Cr 0.90, Chloride 99. Glucose 93. T&S done.   If no acute changes then I anticipate that he can proceed as planned.  Velna Ochsllison Mashayla Lavin, PA-C [facility] Stay Center/Anesthesiology Phone [phone] 11/06/2017 3:53 PM

## 2017-11-11 ENCOUNTER — Other Ambulatory Visit: Payer: Self-pay | Admitting: *Deleted

## 2017-11-11 MED ORDER — TRANEXAMIC ACID 1000 MG/10ML IV SOLN
1000.0000 mg | INTRAVENOUS | Status: AC
  Administered 2017-11-12: 1000 mg via INTRAVENOUS
  Filled 2017-11-11: qty 1100

## 2017-11-11 NOTE — Patient Outreach (Signed)
[facility]) Care Management  11/11/2017  Marcello MooresStevie E Bisig [birth year] [id number]   CSW contacted patient to check in and confirm his hip surgery for tomorrow. "they just called and changed the time for me to be there tomorrow".  Patient is eager to get surgery complete and relieve his pain.  "I live alone and will need help".  CSW discussed possible SNF needs as previously discussed at [facility].  CSW advised him to let the staff at hospital know his interest in SNF rehab .  CSW will follow along as well and assist as needed.   Reece LevyJanet Deven Audi, MSW, LCSW Clinical Social Worker  [facility] [phone]

## 2017-11-12 ENCOUNTER — Encounter (HOSPITAL_COMMUNITY): Payer: Self-pay | Admitting: Certified Registered Nurse Anesthetist

## 2017-11-12 ENCOUNTER — Inpatient Hospital Stay (HOSPITAL_COMMUNITY)
Admission: RE | Admit: 2017-11-12 | Discharge: 2017-11-14 | DRG: 470 | Disposition: A | Discharge status: Skilled Nursing Facility | Payer: Medicare Other | Source: Ambulatory Visit | Attending: Orthopaedic Surgery | Admitting: Orthopaedic Surgery

## 2017-11-12 ENCOUNTER — Other Ambulatory Visit: Payer: Self-pay

## 2017-11-12 ENCOUNTER — Encounter (HOSPITAL_COMMUNITY)
Admission: RE | Disposition: A | Discharge status: Skilled Nursing Facility | Payer: Self-pay | Source: Ambulatory Visit | Attending: Orthopaedic Surgery

## 2017-11-12 ENCOUNTER — Inpatient Hospital Stay (HOSPITAL_COMMUNITY): Payer: Medicare Other | Admitting: Certified Registered Nurse Anesthetist

## 2017-11-12 ENCOUNTER — Inpatient Hospital Stay (HOSPITAL_COMMUNITY): Payer: Medicare Other

## 2017-11-12 ENCOUNTER — Inpatient Hospital Stay (HOSPITAL_COMMUNITY): Payer: Medicare Other | Admitting: Vascular Surgery

## 2017-11-12 DIAGNOSIS — R739 Hyperglycemia, unspecified: Secondary | ICD-10-CM | POA: Diagnosis present

## 2017-11-12 DIAGNOSIS — Z96642 Presence of left artificial hip joint: Secondary | ICD-10-CM | POA: Diagnosis present

## 2017-11-12 DIAGNOSIS — E871 Hypo-osmolality and hyponatremia: Secondary | ICD-10-CM | POA: Diagnosis not present

## 2017-11-12 DIAGNOSIS — E876 Hypokalemia: Secondary | ICD-10-CM | POA: Diagnosis not present

## 2017-11-12 DIAGNOSIS — Z7982 Long term (current) use of aspirin: Secondary | ICD-10-CM

## 2017-11-12 DIAGNOSIS — F419 Anxiety disorder, unspecified: Secondary | ICD-10-CM | POA: Diagnosis present

## 2017-11-12 DIAGNOSIS — F17210 Nicotine dependence, cigarettes, uncomplicated: Secondary | ICD-10-CM | POA: Diagnosis present

## 2017-11-12 DIAGNOSIS — Z96649 Presence of unspecified artificial hip joint: Secondary | ICD-10-CM

## 2017-11-12 DIAGNOSIS — J322 Chronic ethmoidal sinusitis: Secondary | ICD-10-CM | POA: Diagnosis not present

## 2017-11-12 DIAGNOSIS — D62 Acute posthemorrhagic anemia: Secondary | ICD-10-CM | POA: Diagnosis not present

## 2017-11-12 DIAGNOSIS — I10 Essential (primary) hypertension: Secondary | ICD-10-CM | POA: Diagnosis not present

## 2017-11-12 DIAGNOSIS — Z419 Encounter for procedure for purposes other than remedying health state, unspecified: Secondary | ICD-10-CM

## 2017-11-12 DIAGNOSIS — Z8249 Family history of ischemic heart disease and other diseases of the circulatory system: Secondary | ICD-10-CM | POA: Diagnosis not present

## 2017-11-12 DIAGNOSIS — J320 Chronic maxillary sinusitis: Secondary | ICD-10-CM | POA: Diagnosis present

## 2017-11-12 DIAGNOSIS — R2681 Unsteadiness on feet: Secondary | ICD-10-CM | POA: Diagnosis not present

## 2017-11-12 DIAGNOSIS — E861 Hypovolemia: Secondary | ICD-10-CM | POA: Diagnosis present

## 2017-11-12 DIAGNOSIS — R278 Other lack of coordination: Secondary | ICD-10-CM | POA: Diagnosis not present

## 2017-11-12 DIAGNOSIS — M6389 Disorders of muscle in diseases classified elsewhere, multiple sites: Secondary | ICD-10-CM | POA: Diagnosis not present

## 2017-11-12 DIAGNOSIS — S79911A Unspecified injury of right hip, initial encounter: Secondary | ICD-10-CM | POA: Diagnosis not present

## 2017-11-12 DIAGNOSIS — M1611 Unilateral primary osteoarthritis, right hip: Secondary | ICD-10-CM | POA: Diagnosis not present

## 2017-11-12 DIAGNOSIS — Z833 Family history of diabetes mellitus: Secondary | ICD-10-CM | POA: Diagnosis not present

## 2017-11-12 DIAGNOSIS — N390 Urinary tract infection, site not specified: Secondary | ICD-10-CM | POA: Diagnosis not present

## 2017-11-12 DIAGNOSIS — Z96641 Presence of right artificial hip joint: Secondary | ICD-10-CM | POA: Diagnosis not present

## 2017-11-12 DIAGNOSIS — R55 Syncope and collapse: Secondary | ICD-10-CM | POA: Diagnosis not present

## 2017-11-12 DIAGNOSIS — G8911 Acute pain due to trauma: Secondary | ICD-10-CM | POA: Diagnosis not present

## 2017-11-12 DIAGNOSIS — Z471 Aftercare following joint replacement surgery: Secondary | ICD-10-CM | POA: Diagnosis not present

## 2017-11-12 HISTORY — PX: TOTAL HIP ARTHROPLASTY: SHX124

## 2017-11-12 LAB — CBC
HCT: 36 % — ABNORMAL LOW (ref 39.0–52.0)
HEMOGLOBIN: 11.7 g/dL — AB (ref 13.0–17.0)
MCH: 29.9 pg (ref 26.0–34.0)
MCHC: 32.5 g/dL (ref 30.0–36.0)
MCV: 92.1 fL (ref 78.0–100.0)
Platelets: 205 10*3/uL (ref 150–400)
RBC: 3.91 MIL/uL — AB (ref 4.22–5.81)
RDW: 14.3 % (ref 11.5–15.5)
WBC: 10.3 10*3/uL (ref 4.0–10.5)

## 2017-11-12 LAB — BASIC METABOLIC PANEL
ANION GAP: 9 (ref 5–15)
BUN: 14 mg/dL (ref 6–20)
CHLORIDE: 105 mmol/L (ref 101–111)
CO2: 21 mmol/L — ABNORMAL LOW (ref 22–32)
Calcium: 7.6 mg/dL — ABNORMAL LOW (ref 8.9–10.3)
Creatinine, Ser: 1.15 mg/dL (ref 0.61–1.24)
GFR calc Af Amer: >60 mL/min (ref >60)
GFR calc non Af Amer: >60 mL/min (ref >60)
Glucose, Bld: 228 mg/dL — ABNORMAL HIGH (ref 65–99)
POTASSIUM: 4.3 mmol/L (ref 3.5–5.1)
Sodium: 135 mmol/L (ref 135–145)

## 2017-11-12 LAB — GLUCOSE, CAPILLARY: Glucose-Capillary: 257 mg/dL — ABNORMAL HIGH (ref 65–99)

## 2017-11-12 LAB — TROPONIN I: Troponin I: <0.03 ng/mL (ref <0.03)

## 2017-11-12 SURGERY — ARTHROPLASTY, HIP, TOTAL, ANTERIOR APPROACH
Anesthesia: Spinal | Site: Hip | Laterality: Right

## 2017-11-12 MED ORDER — DEXAMETHASONE SODIUM PHOSPHATE 4 MG/ML IJ SOLN
INTRAMUSCULAR | Status: DC | PRN
  Administered 2017-11-12: 10 mg via INTRAVENOUS

## 2017-11-12 MED ORDER — VANCOMYCIN HCL 1000 MG IV SOLR
INTRAVENOUS | Status: AC
  Filled 2017-11-12: qty 1000

## 2017-11-12 MED ORDER — METOCLOPRAMIDE HCL 5 MG PO TABS
5.0000 mg | ORAL_TABLET | Freq: Three times a day (TID) | ORAL | Status: DC | PRN
  Filled 2017-11-12: qty 2

## 2017-11-12 MED ORDER — DEXAMETHASONE SODIUM PHOSPHATE 10 MG/ML IJ SOLN
10.0000 mg | Freq: Once | INTRAMUSCULAR | Status: AC
  Administered 2017-11-13: 10 mg via INTRAVENOUS
  Filled 2017-11-12: qty 1

## 2017-11-12 MED ORDER — LACTATED RINGERS IV SOLN
INTRAVENOUS | Status: DC
  Administered 2017-11-12: 10:00:00 via INTRAVENOUS

## 2017-11-12 MED ORDER — MAGNESIUM CITRATE PO SOLN: 1.0000 | Freq: Once | ORAL | Status: DC | PRN

## 2017-11-12 MED ORDER — CHLORHEXIDINE GLUCONATE 4 % EX LIQD: 60.0000 mL | Freq: Once | CUTANEOUS | Status: DC

## 2017-11-12 MED ORDER — CEFAZOLIN SODIUM-DEXTROSE 2-4 GM/100ML-% IV SOLN
2.0000 g | INTRAVENOUS | Status: AC
  Administered 2017-11-12: 2 g via INTRAVENOUS

## 2017-11-12 MED ORDER — SODIUM CHLORIDE 0.9 % IV BOLUS (SEPSIS)
1000.0000 mL | Freq: Once | INTRAVENOUS | Status: AC
  Administered 2017-11-12: 1000 mL via INTRAVENOUS

## 2017-11-12 MED ORDER — PROMETHAZINE HCL 25 MG PO TABS: 25.0000 mg | ORAL_TABLET | Freq: Four times a day (QID) | ORAL | 1 refills | Status: DC | PRN

## 2017-11-12 MED ORDER — PROPOFOL 500 MG/50ML IV EMUL
INTRAVENOUS | Status: DC | PRN
  Administered 2017-11-12: 75 ug/kg/min via INTRAVENOUS

## 2017-11-12 MED ORDER — PHENYLEPHRINE HCL 10 MG/ML IJ SOLN
INTRAVENOUS | Status: DC | PRN
  Administered 2017-11-12: 40 ug/min via INTRAVENOUS

## 2017-11-12 MED ORDER — PHENOL 1.4 % MT LIQD: 1.0000 | OROMUCOSAL | Status: DC | PRN

## 2017-11-12 MED ORDER — ONDANSETRON HCL 4 MG/2ML IJ SOLN
INTRAMUSCULAR | Status: DC | PRN
Start: 2017-11-12 — End: 2017-11-12
  Administered 2017-11-12: 4 mg via INTRAVENOUS

## 2017-11-12 MED ORDER — ONDANSETRON HCL 4 MG/2ML IJ SOLN: 4.0000 mg | Freq: Four times a day (QID) | INTRAMUSCULAR | Status: DC | PRN

## 2017-11-12 MED ORDER — FENTANYL CITRATE (PF) 250 MCG/5ML IJ SOLN
INTRAMUSCULAR | Status: AC
  Filled 2017-11-12: qty 5

## 2017-11-12 MED ORDER — LACTATED RINGERS IV SOLN
INTRAVENOUS | Status: DC | PRN
  Administered 2017-11-12 (×2): via INTRAVENOUS

## 2017-11-12 MED ORDER — ACETAMINOPHEN 325 MG PO TABS
650.0000 mg | ORAL_TABLET | ORAL | Status: DC | PRN
Start: 2017-11-12 — End: 2017-11-14

## 2017-11-12 MED ORDER — CEFAZOLIN SODIUM-DEXTROSE 2-4 GM/100ML-% IV SOLN
INTRAVENOUS | Status: AC
  Filled 2017-11-12: qty 100

## 2017-11-12 MED ORDER — FENTANYL CITRATE (PF) 100 MCG/2ML IJ SOLN: 25.0000 ug | INTRAMUSCULAR | Status: DC | PRN

## 2017-11-12 MED ORDER — TRANEXAMIC ACID 1000 MG/10ML IV SOLN
2000.0000 mg | Freq: Once | INTRAVENOUS | Status: DC
  Filled 2017-11-12: qty 20

## 2017-11-12 MED ORDER — MIDAZOLAM HCL 2 MG/2ML IJ SOLN
INTRAMUSCULAR | Status: AC
  Filled 2017-11-12: qty 2

## 2017-11-12 MED ORDER — MORPHINE SULFATE (PF) 2 MG/ML IV SOLN
1.0000 mg | INTRAVENOUS | Status: DC | PRN
  Administered 2017-11-12: 1 mg via INTRAVENOUS
  Filled 2017-11-12: qty 1

## 2017-11-12 MED ORDER — SODIUM CHLORIDE 0.9 % IV SOLN
INTRAVENOUS | Status: AC
  Administered 2017-11-12 – 2017-11-13 (×2): via INTRAVENOUS

## 2017-11-12 MED ORDER — ALUM & MAG HYDROXIDE-SIMETH 200-200-20 MG/5ML PO SUSP: 30.0000 mL | ORAL | Status: DC | PRN

## 2017-11-12 MED ORDER — PROPOFOL 10 MG/ML IV BOLUS
INTRAVENOUS | Status: DC | PRN
  Administered 2017-11-12: 25 mg via INTRAVENOUS

## 2017-11-12 MED ORDER — ONDANSETRON HCL 4 MG PO TABS: 4.0000 mg | ORAL_TABLET | Freq: Three times a day (TID) | ORAL | 0 refills | Status: DC | PRN

## 2017-11-12 MED ORDER — SODIUM CHLORIDE 0.9 % IV SOLN
INTRAVENOUS | Status: DC
  Administered 2017-11-12: 18:00:00 via INTRAVENOUS

## 2017-11-12 MED ORDER — PROPOFOL 10 MG/ML IV BOLUS
INTRAVENOUS | Status: AC
  Filled 2017-11-12: qty 20

## 2017-11-12 MED ORDER — ACETAMINOPHEN 500 MG PO TABS
1000.0000 mg | ORAL_TABLET | Freq: Four times a day (QID) | ORAL | Status: AC
  Administered 2017-11-12 – 2017-11-13 (×4): 1000 mg via ORAL
  Filled 2017-11-12 (×4): qty 2

## 2017-11-12 MED ORDER — METHOCARBAMOL 500 MG PO TABS
500.0000 mg | ORAL_TABLET | Freq: Four times a day (QID) | ORAL | Status: DC | PRN
  Administered 2017-11-13 – 2017-11-14 (×2): 500 mg via ORAL
  Filled 2017-11-12 (×2): qty 1

## 2017-11-12 MED ORDER — POLYETHYLENE GLYCOL 3350 17 G PO PACK: 17.0000 g | PACK | Freq: Every day | ORAL | Status: DC | PRN

## 2017-11-12 MED ORDER — SORBITOL 70 % SOLN: 30.0000 mL | Freq: Every day | Status: DC | PRN

## 2017-11-12 MED ORDER — VANCOMYCIN HCL 1000 MG IV SOLR
INTRAVENOUS | Status: DC | PRN
  Administered 2017-11-12: 1000 mg

## 2017-11-12 MED ORDER — SODIUM CHLORIDE 0.9 % IR SOLN
Status: DC | PRN
  Administered 2017-11-12: 3000 mL

## 2017-11-12 MED ORDER — MENTHOL 3 MG MT LOZG: 1.0000 | LOZENGE | OROMUCOSAL | Status: DC | PRN

## 2017-11-12 MED ORDER — 0.9 % SODIUM CHLORIDE (POUR BTL) OPTIME
TOPICAL | Status: DC | PRN
  Administered 2017-11-12: 1000 mL

## 2017-11-12 MED ORDER — TRANEXAMIC ACID 1000 MG/10ML IV SOLN
INTRAVENOUS | Status: AC | PRN
  Administered 2017-11-12: 2000 mg via TOPICAL

## 2017-11-12 MED ORDER — ASPIRIN EC 325 MG PO TBEC: 325.0000 mg | DELAYED_RELEASE_TABLET | Freq: Two times a day (BID) | ORAL | 0 refills | Status: DC

## 2017-11-12 MED ORDER — METOCLOPRAMIDE HCL 5 MG/ML IJ SOLN: 5.0000 mg | Freq: Three times a day (TID) | INTRAMUSCULAR | Status: DC | PRN

## 2017-11-12 MED ORDER — KETOROLAC TROMETHAMINE 15 MG/ML IJ SOLN
30.0000 mg | Freq: Four times a day (QID) | INTRAMUSCULAR | Status: AC
  Administered 2017-11-12 – 2017-11-13 (×4): 30 mg via INTRAVENOUS
  Filled 2017-11-12 (×3): qty 2

## 2017-11-12 MED ORDER — TIZANIDINE HCL 4 MG PO TABS: 4.0000 mg | ORAL_TABLET | Freq: Four times a day (QID) | ORAL | 2 refills | Status: DC | PRN

## 2017-11-12 MED ORDER — OXYCODONE HCL 5 MG PO TABS
5.0000 mg | ORAL_TABLET | ORAL | Status: DC | PRN
  Administered 2017-11-12: 5 mg via ORAL
  Filled 2017-11-12: qty 1

## 2017-11-12 MED ORDER — SODIUM CHLORIDE 0.9 % IV BOLUS (SEPSIS)
500.0000 mL | Freq: Once | INTRAVENOUS | Status: AC
  Administered 2017-11-12: 500 mL via INTRAVENOUS

## 2017-11-12 MED ORDER — INFLUENZA VAC SPLIT QUAD 0.5 ML IM SUSY
0.5000 mL | PREFILLED_SYRINGE | INTRAMUSCULAR | Status: DC
  Filled 2017-11-12: qty 0.5

## 2017-11-12 MED ORDER — OXYCODONE HCL ER 10 MG PO T12A: 10.0000 mg | EXTENDED_RELEASE_TABLET | Freq: Two times a day (BID) | ORAL | 0 refills | Status: DC

## 2017-11-12 MED ORDER — DIPHENHYDRAMINE HCL 12.5 MG/5ML PO ELIX
25.0000 mg | ORAL_SOLUTION | ORAL | Status: DC | PRN
Start: 2017-11-12 — End: 2017-11-14
  Filled 2017-11-12: qty 10

## 2017-11-12 MED ORDER — METHOCARBAMOL 1000 MG/10ML IJ SOLN
500.0000 mg | Freq: Four times a day (QID) | INTRAVENOUS | Status: DC | PRN
  Filled 2017-11-12: qty 5

## 2017-11-12 MED ORDER — OXYCODONE HCL 5 MG PO TABS
10.0000 mg | ORAL_TABLET | ORAL | Status: DC | PRN
  Administered 2017-11-13 – 2017-11-14 (×4): 10 mg via ORAL
  Filled 2017-11-12 (×4): qty 2

## 2017-11-12 MED ORDER — CEFAZOLIN SODIUM-DEXTROSE 2-4 GM/100ML-% IV SOLN
2.0000 g | Freq: Four times a day (QID) | INTRAVENOUS | Status: AC
  Administered 2017-11-12 – 2017-11-13 (×3): 2 g via INTRAVENOUS
  Filled 2017-11-12 (×3): qty 100

## 2017-11-12 MED ORDER — MIDAZOLAM HCL 5 MG/5ML IJ SOLN
INTRAMUSCULAR | Status: DC | PRN
  Administered 2017-11-12 (×4): 1 mg via INTRAVENOUS

## 2017-11-12 MED ORDER — ONDANSETRON HCL 4 MG PO TABS: 4.0000 mg | ORAL_TABLET | Freq: Four times a day (QID) | ORAL | Status: DC | PRN

## 2017-11-12 MED ORDER — ASPIRIN EC 325 MG PO TBEC
325.0000 mg | DELAYED_RELEASE_TABLET | Freq: Two times a day (BID) | ORAL | Status: DC
  Administered 2017-11-13 – 2017-11-14 (×2): 325 mg via ORAL
  Filled 2017-11-12 (×2): qty 1

## 2017-11-12 MED ORDER — SENNOSIDES-DOCUSATE SODIUM 8.6-50 MG PO TABS: 1.0000 | ORAL_TABLET | Freq: Every evening | ORAL | 1 refills | Status: DC | PRN

## 2017-11-12 MED ORDER — SODIUM CHLORIDE 0.9 % IV SOLN
1000.0000 mg | Freq: Once | INTRAVENOUS | Status: AC
  Administered 2017-11-12: 1000 mg via INTRAVENOUS
  Filled 2017-11-12: qty 10

## 2017-11-12 MED ORDER — OXYCODONE-ACETAMINOPHEN 5-325 MG PO TABS: 1.0000 | ORAL_TABLET | ORAL | 0 refills | Status: DC | PRN

## 2017-11-12 MED ORDER — OXYCODONE HCL ER 15 MG PO T12A
15.0000 mg | EXTENDED_RELEASE_TABLET | Freq: Two times a day (BID) | ORAL | Status: DC
  Administered 2017-11-12 – 2017-11-14 (×4): 15 mg via ORAL
  Filled 2017-11-12 (×4): qty 1

## 2017-11-12 MED ORDER — ACETAMINOPHEN 650 MG RE SUPP: 650.0000 mg | RECTAL | Status: DC | PRN

## 2017-11-12 MED ORDER — KETOROLAC TROMETHAMINE 30 MG/ML IJ SOLN
INTRAMUSCULAR | Status: AC
  Administered 2017-11-12: 30 mg
  Filled 2017-11-12: qty 1

## 2017-11-12 SURGICAL SUPPLY — 46 items
BAG DECANTER FOR FLEXI CONT (MISCELLANEOUS) ×3 IMPLANT
CAPT HIP TOTAL 2 ×2 IMPLANT
CELLS DAT CNTRL 66122 CELL SVR (MISCELLANEOUS) ×1 IMPLANT
COVER SURGICAL LIGHT HANDLE (MISCELLANEOUS) ×3 IMPLANT
DRAPE C-ARM 42X72 X-RAY (DRAPES) ×3 IMPLANT
DRAPE STERI IOBAN 125X83 (DRAPES) ×3 IMPLANT
DRAPE U-SHAPE 47X51 STRL (DRAPES) ×6 IMPLANT
DRSG AQUACEL AG ADV 3.5X 6 (GAUZE/BANDAGES/DRESSINGS) ×2 IMPLANT
DRSG AQUACEL AG ADV 3.5X10 (GAUZE/BANDAGES/DRESSINGS) ×3 IMPLANT
DURAPREP 26ML APPLICATOR (WOUND CARE) ×3 IMPLANT
ELECT BLADE 4.0 EZ CLEAN MEGAD (MISCELLANEOUS) ×3
ELECT REM PT RETURN 9FT ADLT (ELECTROSURGICAL) ×3
ELECTRODE BLDE 4.0 EZ CLN MEGD (MISCELLANEOUS) ×1 IMPLANT
ELECTRODE REM PT RTRN 9FT ADLT (ELECTROSURGICAL) ×1 IMPLANT
GLOVE SKINSENSE NS SZ7.5 (GLOVE) ×2
GLOVE SKINSENSE STRL SZ7.5 (GLOVE) ×1 IMPLANT
GLOVE SURG SYN 7.5  E (GLOVE) ×4
GLOVE SURG SYN 7.5 E (GLOVE) ×2 IMPLANT
GLOVE SURG SYN 7.5 PF PI (GLOVE) ×2 IMPLANT
GOWN SRG XL XLNG 56XLVL 4 (GOWN DISPOSABLE) ×1 IMPLANT
GOWN STRL NON-REIN XL XLG LVL4 (GOWN DISPOSABLE) ×3
GOWN STRL REUS W/ TWL LRG LVL3 (GOWN DISPOSABLE) IMPLANT
GOWN STRL REUS W/TWL LRG LVL3 (GOWN DISPOSABLE)
HANDPIECE INTERPULSE COAX TIP (DISPOSABLE) ×3
HOOD PEEL AWAY FLYTE STAYCOOL (MISCELLANEOUS) ×6 IMPLANT
IV NS IRRIG 3000ML ARTHROMATIC (IV SOLUTION) ×3 IMPLANT
KIT BASIN OR (CUSTOM PROCEDURE TRAY) ×3 IMPLANT
MARKER SKIN DUAL TIP RULER LAB (MISCELLANEOUS) ×3 IMPLANT
PACK TOTAL JOINT (CUSTOM PROCEDURE TRAY) ×3 IMPLANT
PACK UNIVERSAL I (CUSTOM PROCEDURE TRAY) ×3 IMPLANT
RETRACTOR WND ALEXIS 18 MED (MISCELLANEOUS) ×1 IMPLANT
RTRCTR WOUND ALEXIS 18CM MED (MISCELLANEOUS) ×3
SAW OSC TIP CART 19.5X105X1.3 (SAW) ×3 IMPLANT
SET HNDPC FAN SPRY TIP SCT (DISPOSABLE) ×1 IMPLANT
STAPLER VISISTAT 35W (STAPLE) ×2 IMPLANT
SUT ETHIBOND 2 V 37 (SUTURE) ×3 IMPLANT
SUT MNCRL AB 3-0 PS2 18 (SUTURE) ×2 IMPLANT
SUT VIC AB 0 CT1 27 (SUTURE) ×3
SUT VIC AB 0 CT1 27XBRD ANBCTR (SUTURE) IMPLANT
SUT VIC AB 1 CT1 27 (SUTURE) ×3
SUT VIC AB 1 CT1 27XBRD ANBCTR (SUTURE) ×1 IMPLANT
SUT VIC AB 2-0 CT1 27 (SUTURE) ×3
SUT VIC AB 2-0 CT1 TAPERPNT 27 (SUTURE) ×1 IMPLANT
TOWEL OR 17X26 10 PK STRL BLUE (TOWEL DISPOSABLE) ×3 IMPLANT
TRAY CATH 16FR W/PLASTIC CATH (SET/KITS/TRAYS/PACK) ×1 IMPLANT
YANKAUER SUCT BULB TIP NO VENT (SUCTIONS) ×3 IMPLANT

## 2017-11-12 NOTE — Anesthesia Preprocedure Evaluation (Signed)
Anesthesia Evaluation  Patient identified by MRN, date of birth, ID band Patient awake    Reviewed: Allergy & Precautions, H&P , Patient's Chart, lab work & pertinent test results, reviewed documented beta blocker date and time   Airway Mallampati: II  TM Distance: >3 FB Neck ROM: full    Dental no notable dental hx.    Pulmonary Current Smoker,    Pulmonary exam normal breath sounds clear to auscultation       Cardiovascular  Rhythm:regular Rate:Normal     Neuro/Psych    GI/Hepatic   Endo/Other    Renal/GU      Musculoskeletal   Abdominal   Peds  Hematology   Anesthesia Other Findings Left ventricle: The cavity size was normal. Wall thickness was normal. Systolic function was normal. The estimated ejection fraction was in the range of 55% to 60%. Wall motion was normal EtOH abuse    Reproductive/Obstetrics                             Anesthesia Physical Anesthesia Plan  ASA: III  Anesthesia Plan: Spinal   Post-op Pain Management:    Induction:   PONV Risk Score and Plan:   Airway Management Planned:   Additional Equipment:   Intra-op Plan:   Post-operative Plan:   Informed Consent: I have reviewed the patients History and Physical, chart, labs and discussed the procedure including the risks, benefits and alternatives for the proposed anesthesia with the patient or authorized representative who has indicated his/her understanding and acceptance.   Dental Advisory Given  Plan Discussed with: CRNA and Surgeon  Anesthesia Plan Comments: (  )        Anesthesia Quick Evaluation

## 2017-11-12 NOTE — H&P (Signed)
PREOPERATIVE H&P  Chief Complaint: right hip degenerative joint disease  HPI: [name] is a 56 y.o. male who presents for surgical treatment of right hip degenerative joint disease.  He denies any changes in medical history.  Past Medical History:  Diagnosis Date  . Alcohol abuse    in the past  . Anxiety   . Cardiomyopathy (HCC)    hx of   Past Surgical History:  Procedure Laterality Date  . APPENDECTOMY    . HERNIA REPAIR    . TOTAL HIP ARTHROPLASTY Left 10/15/2013   Procedure: LEFT TOTAL HIP ARTHROPLASTY;  Surgeon: Thera FlakeW D Caffrey Jr., MD;  Location: [facility] OR;  Service: Orthopedics;  Laterality: Left;   Social History   Socioeconomic History  . Marital status: Married    Spouse name: [name]  . Number of children: 0  . Years of education: 4412  . Highest education level: Not on file  Social Needs  . Financial resource strain: Not on file  . Food insecurity - worry: Not on file  . Food insecurity - inability: Not on file  . Transportation needs - medical: Not on file  . Transportation needs - non-medical: Not on file  Occupational History  . Occupation: Copywriter, advertisingurniture    Employer: [employer]  Tobacco Use  . Smoking status: Current Some Day Smoker    Packs/day: 0.25    Years: 4.00    Pack years: 1.00    Types: Cigarettes  . Smokeless tobacco: Never Used  . Tobacco comment: stopped a few days ago as instructed by Dr, Roda ShuttersXu  Substance and Sexual Activity  . Alcohol use: Yes    Comment: occasionally  . Drug use: No  . Sexual activity: Yes    Birth control/protection: None  Other Topics Concern  . Not on file  Social History Narrative   Regular exercise-no   Caffeine Use-yes   Patient lives at home with wife [name].   Patient has no children.   Patient has a 12th grade education.          Family History  Problem Relation Age of Onset  . Colon cancer Brother   . Heart failure Brother   . Liver cancer Brother   . Diabetes Paternal Uncle   . Stroke Neg Hx     . Learning disabilities Neg Hx   . Hypertension Neg Hx   . Heart disease Neg Hx    No Known Allergies Prior to Admission medications   Medication Sig Start Date End Date Taking? Authorizing Provider  HYDROcodone-acetaminophen (NORCO/VICODIN) 5-325 MG tablet Take 1-2 tablets by mouth every 6 hours as needed for pain and/or cough. Patient not taking: Reported on 10/29/2017 09/10/17   Pisciotta, Joni ReiningNicole, PA-C  traMADol (ULTRAM) 50 MG tablet Take 1-2 tablets (50-100 mg total) by mouth 3 (three) times daily as needed. Patient not taking: Reported on 10/29/2017 09/12/17   Tarry KosXu, Alya Smaltz M, MD     Positive ROS: All other systems have been reviewed and were otherwise negative with the exception of those mentioned in the HPI and as above.  Physical Exam: General: Alert, no acute distress Cardiovascular: No pedal edema Respiratory: No cyanosis, no use of accessory musculature GI: abdomen soft Skin: No lesions in the area of chief complaint Neurologic: Sensation intact distally Psychiatric: Patient is competent for consent with normal mood and affect Lymphatic: no lymphedema  MUSCULOSKELETAL: exam stable  Assessment: right hip degenerative joint disease  Plan: Plan for Procedure(s): RIGHT TOTAL HIP ARTHROPLASTY ANTERIOR  APPROACH  The risks benefits and alternatives were discussed with the patient including but not limited to the risks of nonoperative treatment, versus surgical intervention including infection, bleeding, nerve injury,  blood clots, cardiopulmonary complications, morbidity, mortality, among others, and they were willing to proceed.   Glee ArvinMichael Taira Knabe, MD   11/12/2017 7:07 AM

## 2017-11-12 NOTE — Anesthesia Procedure Notes (Signed)
Spinal  Patient location during procedure: OR Staffing Anesthesiologist: Mavric Cortright, MD Spinal Block Patient position: sitting Prep: DuraPrep Patient monitoring: heart rate, blood pressure and continuous pulse ox Approach: right paramedian Location: L3-4 Injection technique: single-shot Needle Needle type: Sprotte  Needle gauge: 24 G Needle length: 9 cm Assessment Sensory level: T4 Additional Notes Spinal Dosage in OR  .75% Bupivicaine ml       1.9     

## 2017-11-12 NOTE — Significant Event (Signed)
Rapid Response Event Note Code Blue called  Overview: Time Called: 2037 Arrival Time: 2040 Event Type: Other (Comment)(Code Blue)  Initial Focused Assessment: On arrival pt sitting on side of the bed. Pt  Alert and oriented x3 but lethargic and weak. Skin clammy and extremely diaphoretic. Pt assisted back into bed, had another diaphoretic spell. Pt denies any CP/SOB, states "I just feel weak and I need to have a BM." Placed on 2L [place]. SBP 60-70's, HR 77, RR 16, 98% RA. 500 cc NS bolus started.  RN stated pt's "legs gave way" as she assisted him to a standing position to use BSC. She then assisted him back into the bed when his upper body fell back into the bed and his eyes rolled up in his head. Pt never lost a pulse.  Dr. Otelia SergeantNitka paged and come to bedside to evaluate patient. Consult with [facility] to transfer pt to SDU  Interventions: EKG-NSR Placed on 2L [place] 500 cc NS Bolus  IV redressed and wrapped with Kerlex gauze  CBC, BMP, Troponin, Coags, results pending CBG 257   Event Summary: Name of Physician Notified: Dr. Otelia SergeantNitka  at 2048    at    Outcome: Transferred (Comment)     Phillips GroutSHULAR, Sandi CarneLESLIE Paige

## 2017-11-12 NOTE — Transfer of Care (Signed)
Immediate Anesthesia Transfer of Care Note  Patient: Shawn CollardStevie E Smith  Procedure(s) Performed: RIGHT TOTAL HIP ARTHROPLASTY ANTERIOR APPROACH (Right Hip)  Patient Location: PACU  Anesthesia Type:Spinal  Level of Consciousness: awake, alert , oriented and patient cooperative  Airway & Oxygen Therapy: Patient Spontanous Breathing and Patient connected to face mask oxygen  Post-op Assessment: Report given to RN and Post -op Vital signs reviewed and stable  Post vital signs: Reviewed and stable  Last Vitals:  Vitals:   11/12/17 1008  BP: (!) 137/91  Pulse: 93  Resp: 19  Temp: 37.1 C    Last Pain:  Vitals:   11/12/17 1008  TempSrc: Oral         Complications: No apparent anesthesia complications

## 2017-11-12 NOTE — Discharge Instructions (Signed)

## 2017-11-12 NOTE — Progress Notes (Addendum)
     Subjective: Day of Surgery Procedure(s) (LRB): RIGHT TOTAL HIP ARTHROPLASTY ANTERIOR APPROACH (Right) Awake, alert and oriented x 4. Nursing called emergently, Patient gotten up to go to bathroom and he collapsed and eye rolled into his head. BP systolic in 60s and heart rate in the 80s. Intra op EBL 200cc. Given TXA periop. Spinal anesthesia, very little post op urine output.   Patient reports pain as moderate.    Objective:   VITALS:  Temp:  [97.3 F (36.3 C)-98.7 F (37.1 C)] 98.5 F (36.9 C) (12/19 1655) Pulse Rate:  [57-93] 70 (12/19 2118) Resp:  [12-28] 19 (12/19 1655) BP: (62-137)/(41-91) 88/60 (12/19 2118) SpO2:  [96 %-100 %] 96 % (12/19 2058) Weight:  [204 lb (92.5 kg)] 204 lb (92.5 kg) (12/19 1011)  Neurologically intact ABD soft Neurovascular intact Sensation intact distally Intact pulses distally Dorsiflexion/Plantar flexion intact Incision: dressing C/D/I and Swelling right proximal thigh is moderate. Bladder scan with 300cc residual.  LABS Recent Labs    11/12/17 2106  HGB 11.7*  WBC 10.3  PLT 205   No results for input(s): NA, K, CL, CO2, BUN, CREATININE, GLUCOSE in the last 72 hours. No results for input(s): LABPT, INR in the last 72 hours.   Assessment/Plan: Day of Surgery Procedure(s) (LRB): RIGHT TOTAL HIP ARTHROPLASTY ANTERIOR APPROACH (Right) Hypotension likely secondary to hypovolemia  Rapid response team initiated bolus with 500CC NS and then given a second 500cc and then a 1000CC NS bolus. HOB lowered with improve SBP into the 90s. Patient will have IV rate increased to 150cc/hr and be transferred to the stepdown unit for close watch of BPs and VS. Obtain STAT lab to rule out cardiac event, Troponin and BMP. In and out cath q 6 hours prn unable to void or if bladder scan shows post void residual greater than 250cc. [facility] has been contacted and has seen this patient. Will assist in care and work up of persisting  hypotension.  Vira BrownsJames Shadrack Smith 11/12/2017, 9:31 PMPatient ID: [name], male   DOB: 07-03-1961, 56 y.o.   MRN: [id number]

## 2017-11-12 NOTE — Consult Note (Signed)
History and Physical    [name] BJY:[id number] DOB: May 03, 1961 DOA: 11/12/2017  PCP: [name], MD  Patient coming from:  5N, consult  Chief Complaint:   Passed out Consulting team - orthopedic surgery  HPI: [name] is a 56 y.o. male with medical history significant of recent hip repair earlier today was getting to his room from recovery when he began to get very diaphoretic because he had to urinate and his hip was hurting a lot.  He says he started to get very agitated and anxious and then had to urinate so he got up and got dizzy and passed out per RN staff.   He does not remember the actual syncope.  He laid back down and he has been feeling fine but his sbp dropped into the high 60 range with normal heart rate.  He is afebrile.  His right hip hurts but is not particularly swollen.  He is overall healthy.  Denies any sob or chest pain.  Denies any cough, n/v/d.  He is getting ivf bolus and his bp is responding.  We are asked to consult for his syncope by dr. Otelia Sergeant with orthopedic team.  Review of Systems: As per HPI otherwise 10 point review of systems negative.   Past Medical History:  Diagnosis Date  . Alcohol abuse    in the past  . Anxiety   . Cardiomyopathy (HCC)    hx of    Past Surgical History:  Procedure Laterality Date  . APPENDECTOMY    . HERNIA REPAIR    . TOTAL HIP ARTHROPLASTY Left 10/15/2013   Procedure: LEFT TOTAL HIP ARTHROPLASTY;  Surgeon: Thera Flake., MD;  Location: [facility] OR;  Service: Orthopedics;  Laterality: Left;     reports that he has been smoking cigarettes.  He has a 1.00 pack-year smoking history. he has never used smokeless tobacco. He reports that he drinks alcohol. He reports that he does not use drugs.  No Known Allergies  Family History  Problem Relation Age of Onset  . Colon cancer Brother   . Heart failure Brother   . Liver cancer Brother   . Diabetes Paternal Uncle   . Stroke Neg Hx   . Learning disabilities Neg  Hx   . Hypertension Neg Hx   . Heart disease Neg Hx     Prior to Admission medications   Medication Sig Start Date End Date Taking? Authorizing Provider  aspirin EC 325 MG tablet Take 1 tablet (325 mg total) by mouth 2 (two) times daily. 11/12/17   Tarry Kos, MD  HYDROcodone-acetaminophen (NORCO/VICODIN) 5-325 MG tablet Take 1-2 tablets by mouth every 6 hours as needed for pain and/or cough. Patient not taking: Reported on 10/29/2017 09/10/17   Pisciotta, Joni Reining, PA-C  ondansetron (ZOFRAN) 4 MG tablet Take 1-2 tablets (4-8 mg total) by mouth every 8 (eight) hours as needed for nausea or vomiting. 11/12/17   Tarry Kos, MD  oxyCODONE (OXYCONTIN) 10 mg 12 hr tablet Take 1 tablet (10 mg total) by mouth every 12 (twelve) hours. 11/12/17   Tarry Kos, MD  oxyCODONE-acetaminophen (PERCOCET) 5-325 MG tablet Take 1-2 tablets by mouth every 4 (four) hours as needed for severe pain. 11/12/17   Tarry Kos, MD  promethazine (PHENERGAN) 25 MG tablet Take 1 tablet (25 mg total) by mouth every 6 (six) hours as needed for nausea. 11/12/17   Tarry Kos, MD  senna-docusate (SENOKOT S) 8.6-50 MG tablet  Take 1 tablet by mouth at bedtime as needed. 11/12/17   Tarry KosXu, Naiping M, MD  tiZANidine (ZANAFLEX) 4 MG tablet Take 1 tablet (4 mg total) by mouth every 6 (six) hours as needed for muscle spasms. 11/12/17   Tarry KosXu, Naiping M, MD  traMADol (ULTRAM) 50 MG tablet Take 1-2 tablets (50-100 mg total) by mouth 3 (three) times daily as needed. Patient not taking: Reported on 10/29/2017 09/12/17   Tarry KosXu, Naiping M, MD    Physical Exam: Vitals:   11/12/17 2125 11/12/17 2136 11/12/17 2215 11/12/17 2241  BP: 94/66 (!) 86/59 103/73 97/61  Pulse: 75 71 84 83  Resp:    (!) 22  Temp:    (!) 96.4 F (35.8 C)  TempSrc:    Axillary  SpO2:    99%  Weight:    98.4 kg (216 lb 14.9 oz)  Height:    5\' 8"  (1.727 m)      Constitutional: NAD, calm, comfortable Vitals:   11/12/17 2125 11/12/17 2136 11/12/17 2215 11/12/17  2241  BP: 94/66 (!) 86/59 103/73 97/61  Pulse: 75 71 84 83  Resp:    (!) 22  Temp:    (!) 96.4 F (35.8 C)  TempSrc:    Axillary  SpO2:    99%  Weight:    98.4 kg (216 lb 14.9 oz)  Height:    5\' 8"  (1.727 m)   Eyes: PERRL, lids and conjunctivae normal ENMT: Mucous membranes are moist. Posterior pharynx clear of any exudate or lesions.Normal dentition.  Neck: normal, supple, no masses, no thyromegaly Respiratory: clear to auscultation bilaterally, no wheezing, no crackles. Normal respiratory effort. No accessory muscle use.  Cardiovascular: Regular rate and rhythm, no murmurs / rubs / gallops. No extremity edema. 2+ pedal pulses. No carotid bruits.  Abdomen: no tenderness, no masses palpated. No hepatosplenomegaly. Bowel sounds positive.  Musculoskeletal: no clubbing / cyanosis. No joint deformity upper and lower extremities. Good ROM, no contractures. Normal muscle tone. Right thigh mild swelling not tense no obvious hematoma Skin: no rashes, lesions, ulcers. No induration Neurologic: CN 2-12 grossly intact. Sensation intact, DTR normal. Strength 5/5 in all 4.  Psychiatric: Normal judgment and insight. Alert and oriented x 3. Normal mood.    Labs on Admission: I have personally reviewed following labs and imaging studies  CBC: Recent Labs  Lab 11/12/17 2106  WBC 10.3  HGB 11.7*  HCT 36.0*  MCV 92.1  PLT 205   Basic Metabolic Panel: Recent Labs  Lab 11/12/17 2106  NA 135  K 4.3  CL 105  CO2 21*  GLUCOSE 228*  BUN 14  CREATININE 1.15  CALCIUM 7.6*   GFR: Estimated Creatinine Clearance: 82.5 mL/min (by C-G formula based on SCr of 1.15 mg/dL). Liver Function Tests: No results for input(s): AST, ALT, ALKPHOS, BILITOT, PROT, ALBUMIN in the last 168 hours. No results for input(s): LIPASE, AMYLASE in the last 168 hours. No results for input(s): AMMONIA in the last 168 hours. Coagulation Profile: No results for input(s): INR, PROTIME in the last 168 hours. Cardiac  Enzymes: Recent Labs  Lab 11/12/17 2106  TROPONINI <0.03   BNP (last 3 results) No results for input(s): PROBNP in the last 8760 hours. HbA1C: No results for input(s): HGBA1C in the last 72 hours. CBG: Recent Labs  Lab 11/12/17 2051  GLUCAP 257*   Lipid Profile: No results for input(s): CHOL, HDL, LDLCALC, TRIG, CHOLHDL, LDLDIRECT in the last 72 hours. Thyroid Function Tests: No results for input(s): TSH,  T4TOTAL, FREET4, T3FREE, THYROIDAB in the last 72 hours. Anemia Panel: No results for input(s): VITAMINB12, FOLATE, FERRITIN, TIBC, IRON, RETICCTPCT in the last 72 hours. Urine analysis:    Component Value Date/Time   COLORURINE YELLOW 11/05/2017 1018   APPEARANCEUR CLEAR 11/05/2017 1018   LABSPEC 1.019 11/05/2017 1018   PHURINE 7.0 11/05/2017 1018   GLUCOSEU NEGATIVE 11/05/2017 1018   GLUCOSEU NEGATIVE 10/08/2017 1441   HGBUR NEGATIVE 11/05/2017 1018   BILIRUBINUR NEGATIVE 11/05/2017 1018   KETONESUR NEGATIVE 11/05/2017 1018   PROTEINUR NEGATIVE 11/05/2017 1018   UROBILINOGEN 0.2 10/08/2017 1441   NITRITE NEGATIVE 11/05/2017 1018   LEUKOCYTESUR NEGATIVE 11/05/2017 1018   Sepsis Labs: !!!!!!!!!!!!!!!!!!!!!!!!!!!!!!!!!!!!!!!!!!!! @LABRCNTIP (procalcitonin:4,lacticidven:4) ) Recent Results (from the past 240 hour(s))  Surgical pcr screen     Status: None   Collection Time: 11/05/17 10:19 AM  Result Value Ref Range Status   MRSA, PCR NEGATIVE NEGATIVE Final   Staphylococcus aureus NEGATIVE NEGATIVE Final    Comment: (NOTE) The Xpert SA Assay (FDA approved for NASAL specimens in patients 63 years of age and older), is one component of a comprehensive surveillance program. It is not intended to diagnose infection nor to guide or monitor treatment.      Radiological Exams on Admission: Dg Pelvis Portable  Result Date: 11/12/2017 CLINICAL DATA:  Right hip replacement EXAM: PORTABLE PELVIS 1-2 VIEWS COMPARISON:  10/15/2013 FINDINGS: Right hip replacement in  satisfactory position and alignment. 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 IMPRESSION: Satisfactory right hip replacement. Electronically Signed   By: Marlan Palau M.D.   On: 11/12/2017 14:58   Dg C-arm 1-60 Min  Result Date: 11/12/2017 CLINICAL DATA:  Osteoarthritis of the right hip. Status post right total hip arthroplasty. EXAM: DG C-ARM 61-120 MIN; OPERATIVE RIGHT HIP WITH PELVIS COMPARISON:  Radiographs dated 09/10/2017 FINDINGS: AP views of the right hip demonstrate the patient has undergone right total hip arthroplasty. The components appear in excellent position in the AP projection. No fractures. IMPRESSION: Satisfactory appearance of the right hip in the AP projection after total hip arthroplasty. Electronically Signed   By: Francene Boyers M.D.   On: 11/12/2017 14:10   Dg Hip Operative Unilat W Or W/o Pelvis Right  Result Date: 11/12/2017 CLINICAL DATA:  Osteoarthritis of the right hip. Status post right total hip arthroplasty. EXAM: DG C-ARM 61-120 MIN; OPERATIVE RIGHT HIP WITH PELVIS COMPARISON:  Radiographs dated 09/10/2017 FINDINGS: AP views of the right hip demonstrate the patient has undergone right total hip arthroplasty. The components appear in excellent position in the AP projection. No fractures. IMPRESSION: Satisfactory appearance of the right hip in the AP projection after total hip arthroplasty. Electronically Signed   By: Francene Boyers M.D.   On: 11/12/2017 14:10    EKG: Independently reviewed. nsr Case discussed with dr Otelia Sergeant Case discussed with RN on 5 N tower and 2 C stepdown unit   Assessment/Plan 56 yo male s/p total right hip replacement anterior approach with post op syncopal episode  Principal Problem:   Syncope- suspect this was a vagal reaction.  Have checked a hgb which is above 11.  Does not appear to be much blood loss into the hip.  Have asked the stepdown RN to monitor this area  closely for any worsening swelling.  Repeat hgb in am.  bp improving and normalizing after 2 liters ivf.  No fever , do not suspect infection, if fever develops asked RN to call us.  Doubt PE  as this occurred very soon postop.  Not on many cardiac meds.  Cont ivf overnight and ck orthostatics in am.  Have bladder scanned and had about 500cc urine in bladder and he has spontaneously voided since will scan q 6 hours.  Trop neg.  ekg nsr.  Agree with moving to stepdown overnight for closer bp monitoring.  Pt has no focal neurological deficits.  Will continue to follow along with you.  Oxygen sats normal.  Active Problems:   Essential hypertension, benign- stable   History of hip replacement- per ortho team    please call [facility] with any questions or concerns   DVT prophylaxis:  scds Code Status:  full Family Communication:  none Disposition Plan:  Per ortho team    DAVID,RACHAL A MD [facility]  If 7PM-7AM, please contact night-coverage [email or web address] Password [email or web address]  11/12/2017, 11:14 PM

## 2017-11-12 NOTE — Progress Notes (Signed)
Initial paged was a code blue. RRT x 2 responded. On arrival found patient lethargic, clammy, cold sweats, with presentation of soft arterial blood pressure systolically in the 60's-70's. Pt is alert and oriented and denies pain. Pt stated that he just has to use the bathroom. Primary RN stated that the patient possibly vagal/syncope episode. MD paged to update on patient current clinical status. RRT provided supplemental oxygen.

## 2017-11-12 NOTE — Op Note (Signed)
RIGHT TOTAL HIP ARTHROPLASTY ANTERIOR APPROACH  Procedure Note Marcello MooresStevie E Khiev   [id number]  Pre-op Diagnosis: right hip degenerative joint disease     Post-op Diagnosis: same   Operative Procedures  1. Total hip replacement; Right hip; uncemented [id number]   Personnel  Surgeon(s): Tarry KosXu, Avonte Sensabaugh M, MD   Anesthesia: spinal  Prosthesis: Depuy Acetabulum: Pinnacle 56 mm Femur: Corail KA 12 Head: 36 mm size: +5 Liner: +4 Bearing Type: ceramic on poly  Total Hip Arthroplasty (Anterior Approach) Op Note:  After informed consent was obtained and the operative extremity marked in the holding area, the patient was brought back to the operating room and placed supine on the HANA table. Next, the operative extremity was prepped and draped in normal sterile fashion. Surgical timeout occurred verifying patient identification, surgical site, surgical procedure and administration of antibiotics.  A modified anterior Smith-Peterson approach to the hip was performed, using the interval between tensor fascia lata and sartorius.  Dissection was carried bluntly down onto the anterior hip capsule. The lateral femoral circumflex vessels were identified and coagulated. A capsulotomy was performed and the capsular flaps tagged for later repair.  Fluoroscopy was utilized to prepare for the femoral neck cut. The neck osteotomy was performed. The femoral head was removed, the acetabular rim was cleared of soft tissue and attention was turned to reaming the acetabulum.  Sequential reaming was performed under fluoroscopic guidance. We reamed to a size 55 mm, and then impacted the acetabular shell. The liner was then placed after irrigation and attention turned to the femur.  After placing the femoral hook, the leg was taken to externally rotated, extended and adducted position taking care to perform soft tissue releases to allow for adequate mobilization of the femur. Soft tissue was cleared from the shoulder of the  greater trochanter and the hook elevator used to improve exposure of the proximal femur. Sequential broaching performed up to a size 12. Trial neck and head were placed. The leg was brought back up to neutral and the construct reduced. The position and sizing of components, offset and leg lengths were checked using fluoroscopy. Stability of the  construct was checked in extension and external rotation without any subluxation or impingement of prosthesis. We dislocated the prosthesis, dropped the leg back into position, removed trial components, and irrigated copiously. The final stem and head was then placed, the leg brought back up, the system reduced and fluoroscopy used to verify positioning.  We irrigated, obtained hemostasis and closed the capsule using #2 ethibond suture.  Dilute betadyne solution was used. The fascia was closed with #1 vicryl plus, the deep fat layer was closed with 0 vicryl, the subcutaneous layers closed with 2.0 Vicryl Plus and the skin closed with 3.0 monocryl and steri strips. A sterile dressing was applied. The patient was awakened in the operating room and taken to recovery in stable condition.  All sponge, needle, and instrument counts were correct at the end of the case.   Position: supine  Complications: none.  Time Out: performed   Drains/Packing: none  Estimated blood loss: 200 cc  Returned to Recovery Room: in good condition.   Antibiotics: yes   Mechanical VTE (DVT) Prophylaxis: sequential compression devices, TED thigh-high  Chemical VTE (DVT) Prophylaxis: aspirin   Fluid Replacement: see anesthesia record  Specimens Removed: 1 to pathology   Sponge and Instrument Count Correct? yes   PACU: portable radiograph - low AP   Admission: inpatient status  Plan/RTC: Return in 2 weeks  for staple removal. Weight Bearing/Load Lower Extremity: full  Hip precautions: none Suture Removal: 10-14 days  Betadine to incision twice daily once dressing is removed  on POD#7  N. Glee ArvinMichael Saia Derossett, MD [facility] [phone] 1:54 PM     Implant Name Type Inv. Item Serial No. Manufacturer Lot No. LRB No. Used  PINN SECTOR W/GRIP ACE CUP 56 - [id number] Hips PINN SECTOR W/GRIP ACE CUP 56  DEPU[employer]ES [id number] Right 1  PINNACLE ALTRX PLUS 4 N 36X56 - [id number] Hips PINNACLE ALTRX PLUS 4 N [id number]  DEPUY SYNTHES [id number] Right 1  HEAD CERAMIC 36 PLUS5 - [id number] Hips HEAD CERAMIC 36 PLUS5  DEPUY SYNTHES [id number] Right 1  STEM CORAIL KA12 - [id number] Stem STEM CORAIL KA12  DEPUY SYNTHES [id number] Right 1

## 2017-11-13 ENCOUNTER — Inpatient Hospital Stay (HOSPITAL_COMMUNITY): Payer: Medicare Other

## 2017-11-13 DIAGNOSIS — Z419 Encounter for procedure for purposes other than remedying health state, unspecified: Secondary | ICD-10-CM

## 2017-11-13 DIAGNOSIS — M1611 Unilateral primary osteoarthritis, right hip: Principal | ICD-10-CM

## 2017-11-13 LAB — BASIC METABOLIC PANEL
Anion gap: 10 (ref 5–15)
BUN: 15 mg/dL (ref 6–20)
CO2: 20 mmol/L — ABNORMAL LOW (ref 22–32)
Calcium: 8.2 mg/dL — ABNORMAL LOW (ref 8.9–10.3)
Chloride: 104 mmol/L (ref 101–111)
Creatinine, Ser: 0.96 mg/dL (ref 0.61–1.24)
GFR calc Af Amer: >60 mL/min (ref >60)
GFR calc non Af Amer: >60 mL/min (ref >60)
GLUCOSE: 144 mg/dL — AB (ref 65–99)
POTASSIUM: 4.4 mmol/L (ref 3.5–5.1)
Sodium: 134 mmol/L — ABNORMAL LOW (ref 135–145)

## 2017-11-13 LAB — CBC WITH DIFFERENTIAL/PLATELET
Basophils Absolute: 0 10*3/uL (ref 0.0–0.1)
Basophils Relative: 0 %
EOS PCT: 0 %
Eosinophils Absolute: 0 10*3/uL (ref 0.0–0.7)
HEMATOCRIT: 34.7 % — AB (ref 39.0–52.0)
HEMOGLOBIN: 11.3 g/dL — AB (ref 13.0–17.0)
LYMPHS ABS: 0.6 10*3/uL — AB (ref 0.7–4.0)
LYMPHS PCT: 7 %
MCH: 30.1 pg (ref 26.0–34.0)
MCHC: 32.6 g/dL (ref 30.0–36.0)
MCV: 92.5 fL (ref 78.0–100.0)
Monocytes Absolute: 0.4 10*3/uL (ref 0.1–1.0)
Monocytes Relative: 4 %
Neutro Abs: 8.1 10*3/uL — ABNORMAL HIGH (ref 1.7–7.7)
Neutrophils Relative %: 89 %
PLATELETS: 189 10*3/uL (ref 150–400)
RBC: 3.75 MIL/uL — AB (ref 4.22–5.81)
RDW: 14.3 % (ref 11.5–15.5)
WBC: 9.2 10*3/uL (ref 4.0–10.5)

## 2017-11-13 LAB — GLUCOSE, CAPILLARY: Glucose-Capillary: 160 mg/dL — ABNORMAL HIGH (ref 65–99)

## 2017-11-13 LAB — TROPONIN I
Troponin I: <0.03 ng/mL (ref <0.03)
Troponin I: <0.03 ng/mL (ref <0.03)
Troponin I: <0.03 ng/mL (ref <0.03)

## 2017-11-13 MED ORDER — LORAZEPAM 2 MG/ML IJ SOLN: 0.0000 mg | Freq: Four times a day (QID) | INTRAMUSCULAR | Status: DC

## 2017-11-13 MED ORDER — LORAZEPAM 1 MG PO TABS: 1.0000 mg | ORAL_TABLET | Freq: Four times a day (QID) | ORAL | Status: DC | PRN

## 2017-11-13 MED ORDER — LORAZEPAM 2 MG/ML IJ SOLN: 0.0000 mg | Freq: Two times a day (BID) | INTRAMUSCULAR | Status: DC

## 2017-11-13 MED ORDER — INFLUENZA VAC SPLIT QUAD 0.5 ML IM SUSY: 0.5000 mL | PREFILLED_SYRINGE | INTRAMUSCULAR | Status: DC | PRN

## 2017-11-13 MED ORDER — THIAMINE HCL 100 MG/ML IJ SOLN: 100.0000 mg | Freq: Every day | INTRAMUSCULAR | Status: DC

## 2017-11-13 MED ORDER — VITAMIN B-1 100 MG PO TABS
100.0000 mg | ORAL_TABLET | Freq: Every day | ORAL | Status: DC
  Administered 2017-11-14: 100 mg via ORAL
  Filled 2017-11-13: qty 1

## 2017-11-13 MED ORDER — LORAZEPAM 2 MG/ML IJ SOLN: 1.0000 mg | Freq: Four times a day (QID) | INTRAMUSCULAR | Status: DC | PRN

## 2017-11-13 MED ORDER — FOLIC ACID 1 MG PO TABS
1.0000 mg | ORAL_TABLET | Freq: Every day | ORAL | Status: DC
  Administered 2017-11-14: 1 mg via ORAL
  Filled 2017-11-13: qty 1

## 2017-11-13 MED ORDER — ADULT MULTIVITAMIN W/MINERALS CH
1.0000 | ORAL_TABLET | Freq: Every day | ORAL | Status: DC
  Administered 2017-11-14: 1 via ORAL
  Filled 2017-11-13: qty 1

## 2017-11-13 NOTE — Progress Notes (Signed)
Orthopedic Tech Progress Note Patient Details:  [name] 10/16/1961 [id number]  Ortho Devices Ortho Device/Splint Location: applied ohf to bed Ortho Device/Splint Interventions: Ordered, Application   Post Interventions Patient Tolerated: Well Instructions Provided: Care of device   [name], [name] 11/13/2017, 9:02 PM

## 2017-11-13 NOTE — Anesthesia Postprocedure Evaluation (Signed)
Anesthesia Post Note  Patient: Shawn CollardStevie E Smith  Procedure(s) Performed: RIGHT TOTAL HIP ARTHROPLASTY ANTERIOR APPROACH (Right Hip)     Patient location during evaluation: PACU Anesthesia Type: Spinal Level of consciousness: awake Pain management: satisfactory to patient Vital Signs Assessment: post-procedure vital signs reviewed and stable Respiratory status: spontaneous breathing Cardiovascular status: blood pressure returned to baseline Postop Assessment: no headache and spinal receding Anesthetic complications: no    Last Vitals:  Vitals:   11/13/17 0500 11/13/17 0600  BP: 94/73 105/75  Pulse: 80 82  Resp: 11 12  Temp:    SpO2: 95% 97%    Last Pain:  Vitals:   11/13/17 0349  TempSrc: Oral  PainSc:                  Jiles GarterJACKSON,Alexea Blase EDWARD

## 2017-11-13 NOTE — Consult Note (Signed)
   [facility]   11/13/2017  [name] [birth year] [id number]   [facility] management services.  Patient has been [facility] a [facility].  Our community based plan of care has focused on disease management and community resource support.  Patient is currently in stepdown level of care in the [facility]. Will follow for progress and disposition needs. Of note, [facility] services does not replace or interfere with any services that are needed or arranged by inpatient case management or social work.  For additional questions or referrals please contact:  Charlesetta ShanksVictoria Isaly Fasching, RN BSN CCM [facility] Liaison  [phone] business mobile phone Toll free office [phone]

## 2017-11-13 NOTE — Progress Notes (Signed)
PT Cancellation Note  Patient Details Name: [name] MRN: [id number] DOB: Sep 15, 1961   Cancelled Treatment:    Reason Eval/Treat Not Completed: Patient at procedure or test/unavailable. Pt off floor for x-ray. Will follow-up for PT evaluation this afternoon.  Ina HomesJaclyn Emidio Warrell, PT, DPT Acute Rehab Services  Pager: [phone]  [name] 11/13/2017, 11:14 AM

## 2017-11-13 NOTE — Progress Notes (Signed)
Patient transported off unit to [facility] for closer monitoring. Report given to  [facility] Decareaux RN.

## 2017-11-13 NOTE — Progress Notes (Signed)
Patient called nurse to be assisted to use the bathroom. Nurse then assisted patient to get out of bed so that he could have a BM. As nurse was helping patient stand up patient's legs felt very weak and patient was assisted back to bed. Patient did not fall, however patient upper body fell back on the bed and his eyes rolled up in his head. Patient did not loose pulse , code blue button in patient's room pressed for immediate assistance. Rapid response team arrived at bedside. Dr Otelia SergeantNitka paged and updated of patient's condition. VSS documented in flowsheet and bolus given to patient  Per MD orders.

## 2017-11-13 NOTE — Care Management Note (Signed)
Case Management Note  Patient Details  Name: [name] MRN: [id number] Date of Birth: 10/12/1961  Subjective/Objective:    Pt is Smith/p total hip arthroplasty anterior approach - post op hypotension - transferred to SD              Action/Plan:  PT eval ordered.  CM will assess HH needs post evaluation   Expected Discharge Date:                  Expected Discharge Plan:     In-House Referral:     Discharge planning Services  CM Consult  Post Acute Care Choice:    Choice offered to:     DME Arranged:    DME Agency:     HH Arranged:    HH Agency:     Status of Service:     If discussed at [employer] Length of Stay Meetings, dates discussed:    Additional Comments:  [name], Shawn Lynam S, RN 11/13/2017, 3:27 PM

## 2017-11-13 NOTE — Evaluation (Addendum)
Physical Therapy Evaluation Patient Details Name: [name] MRN: [id number] DOB: August 24, 1961 Today's Date: 11/13/2017   History of Present Illness  Pt is a 56 y.o. male now s/p R THA (direct ant approach) on 11/12/17. Pt had post-op syncopal episode, with suspected vagal reaction; no focal neurological deficits. Other PMH includes L THA (2014), cardiomyopathy, anxiety.    Clinical Impression  Pt presents with pain and an overall decrease in functional mobility secondary to above. PTA, pt indep with mobility and lives alone; does not drive. Educ on positioning, therex, and importance of mobility. Today, pt able to amb with RW and supervision for safety. According to pt, [facility] and plans to d/c there since pt does not feel safe returning home alone without any support; educ on SNF vs. HHPT with pt still wanting SNF. Pt would benefit from continued acute PT services to maximize functional mobility and independence.     Follow Up Recommendations SNF;Supervision - Intermittent    Equipment Recommendations  Rolling walker with 5" wheels    Recommendations for Other Services       Precautions / Restrictions Precautions Precautions: Fall Restrictions Weight Bearing Restrictions: Yes RLE Weight Bearing: Weight bearing as tolerated      Mobility  Bed Mobility Overal bed mobility: Modified Independent             General bed mobility comments: Increased time and effort with HOB elevated  Transfers Overall transfer level: Needs assistance Equipment used: Rolling walker (2 wheeled) Transfers: Sit to/from Stand Sit to Stand: Supervision         General transfer comment: Pt with decreased eccentric control into sitting secondary to pain; good technique with RW  Ambulation/Gait Ambulation/Gait assistance: Supervision Ambulation Distance (Feet): 250 Feet Assistive device: Rolling walker (2 wheeled) Gait Pattern/deviations:  Step-through pattern;Decreased stride length;Decreased weight shift to right;Antalgic;Trunk flexed Gait velocity: Decreased Gait velocity interpretation: <1.8 ft/sec, indicative of risk for recurrent falls General Gait Details: Slow, antalgic amb with RW and supervision for safety. Educ on heel-to-toe technique and intermittent cues to maintain upright posture.  Stairs            Wheelchair Mobility    Modified Rankin (Stroke Patients Only)       Balance Overall balance assessment: Needs assistance   Sitting balance-Leahy Scale: Fair       Standing balance-Leahy Scale: Fair Standing balance comment: Can static stand and take short steps with no UE support and min guard                             Pertinent Vitals/Pain Pain Assessment: 0-10 Pain Score: 7  Pain Location: RLE Pain Descriptors / Indicators: Discomfort;Sore Pain Intervention(s): Monitored during session    Home Living Family/patient expects to be discharged to:: Skilled nursing facility Living Arrangements: Alone               Additional Comments: According to pt, pre-operatively set-up a stay at [facility]    Prior Function Level of Independence: Needs assistance   Gait / Transfers Assistance Needed: Limited ambulation secondary to hip pain. Does not drive; friends/family run errands for pt.  ADL's / Homemaking Assistance Needed: Indep with ADLs        Hand Dominance        Extremity/Trunk Assessment   Upper Extremity Assessment Upper Extremity Assessment: Overall WFL for tasks assessed    Lower Extremity Assessment Lower Extremity  Assessment: RLE deficits/detail;LLE deficits/detail RLE Deficits / Details: s/p R THA; grossly 3/5 throughout LLE Deficits / Details: L THA in 2014 that pt reports currently needs revision; grossly 4/5 throughout       Communication   Communication: No difficulties  Cognition Arousal/Alertness: Awake/alert Behavior During Therapy: WFL  for tasks assessed/performed Overall Cognitive Status: Within Functional Limits for tasks assessed                                        General Comments General comments (skin integrity, edema, etc.): VSS    Exercises Total Joint Exercises Hip ABduction/ADduction: AROM;Right;5 reps;Supine Long Arc Quad: AROM;Right;10 reps;Seated Knee Flexion: AAROM;Right;10 reps;Seated   Assessment/Plan    PT Assessment Patient needs continued PT services  PT Problem List Decreased strength;Decreased range of motion;Decreased activity tolerance;Decreased balance;Decreased mobility;Pain       PT Treatment Interventions DME instruction;Gait training;Functional mobility training;Therapeutic activities;Therapeutic exercise;Stair training;Balance training;Patient/family education    PT Goals (Current goals can be found in the Care Plan section)  Acute Rehab PT Goals Patient Stated Goal: Receive rehab before returning home alone PT Goal Formulation: With patient Time For Goal Achievement: 11/27/17 Potential to Achieve Goals: Good    Frequency 7X/week   Barriers to discharge        Co-evaluation               AM-PAC PT "6 Clicks" Daily Activity  Outcome Measure Difficulty turning over in bed (including adjusting bedclothes, sheets and blankets)?: None Difficulty moving from lying on back to sitting on the side of the bed? : None Difficulty sitting down on and standing up from a chair with arms (e.g., wheelchair, bedside commode, etc,.)?: None Help needed moving to and from a bed to chair (including a wheelchair)?: A Little Help needed walking in hospital room?: A Little Help needed climbing 3-5 steps with a railing? : A Little 6 Click Score: 21    End of Session Equipment Utilized During Treatment: Gait belt Activity Tolerance: Patient tolerated treatment well;Patient limited by pain Patient left: in bed;with call bell/phone within reach Nurse Communication:  Mobility status PT Visit Diagnosis: Other abnormalities of gait and mobility (R26.89);Pain Pain - Right/Left: Right Pain - part of body: Hip    Time: [id number] PT Time Calculation (min) (ACUTE ONLY): 27 min   Charges:   PT Evaluation $PT Eval Low Complexity: 1 Low PT Treatments $Gait Training: 8-22 mins   PT G Codes:   PT G-Codes **NOT FOR INPATIENT CLASS** Functional Assessment Tool Used: AM-PAC 6 Clicks Basic Mobility Functional Limitation: Mobility: Walking and moving around Mobility: Walking and Moving Around Current Status ([id number]([id number]): At least 20 percent but less than 40 percent impaired, limited or restricted Mobility: Walking and Moving Around Goal Status [phone](G8979): At least 1 percent but less than 20 percent impaired, limited or restricted   [name], PT, DPT Acute Rehab Services  Pager: [phone]  [name] 11/13/2017, 3:36 PM

## 2017-11-13 NOTE — Progress Notes (Signed)
PROGRESS NOTE    [name]  ZOX:[id number] DOB: Feb 10, 1961 DOA: 11/12/2017 PCP: [name], MD   Brief Narrative:  [name] is a 56 y.o.male with medical history significant of recent hip repair. Earlier yesterday was getting to his room from recovery when he began to get very diaphoretic because he had to urinate and his hip was hurting a lot.  He says he started to get very agitated and anxious and then had to urinate so he got up and got dizzy and passed out per RN staff.  He does not remember the actual syncope.  He laid back down and he has been feeling fine but his sbp dropped into the high 60 range with normal heart rate. He received IVF boluses and his bp began responding. TRH was asked to consult for his Syncope by Orthopedic Surgery.   Assessment & Plan:   Principal Problem:   Syncope Active Problems:   Essential hypertension, benign   History of hip replacement   Hip joint replacement status  Syncope -Suspect this was a vagal reaction in the setting of Micturition Syncope and Hypovolemia -Was transferred to SDU yesterday for closer BP monitoring -Recent ECHO 10/24/17 showed EF of 55-60% with normal wall motion and no regional abnormalities but did have Grade 1 DD.  -Troponin x4 Negative and <0.03 -Head CT w/o Contrast showed No acute intracranial abnormality. Mild maxillary and ethmoid sinusitis. -Orthostatic Vital Signs Checked and no longer Orthostaic -Hb/Hct went from 15.3/46.1 -> 11.7/36.0 -> 11.3/34.7 and expected post-op ABLA -C/w Telemetry  -Do not suspect infection and has no neurological deficits -Not on any Beta Blockers  -Given 1x of IV Dexamethasone by Primary  -Decrease IVF from 150 to 75 mL/hr and then stop after 10 more hours in order to avoid Volume overload   Hx of EtOH Use -Recommend CIWA Protocol -Add MVI, Thiamine, and Folic Acid  Hyponatremia -Patient's Na+ was 134 -C/w NS as above -Repeat CMP in  AM  Hyperglycemia -Recommending obtaining HbA1c if blood sugars continue to remain elevated  DJD s/p Right Hip Replacement -Per Primary Team  DVT prophylaxis: SCDs, ASA Code Status: FULL CODE Family Communication: No family present at bedside  Disposition Plan: Per Primary Team; Likely can be transferred back to [facility] with Telemetry   Consultants:   TRH  Orthopedic Surgery is Primary   Procedures:   None   Antimicrobials:  Anti-infectives (From admission, onward)   Start     Dose/Rate Route Frequency Ordered Stop   11/12/17 1800  ceFAZolin (ANCEF) IVPB 2g/100 mL premix     2 g 200 mL/hr over 30 Minutes Intravenous Every 6 hours 11/12/17 1657 11/13/17 0642   11/12/17 1348  vancomycin (VANCOCIN) powder  Status:  Discontinued       As needed 11/12/17 1349 11/12/17 1412   11/12/17 1015  ceFAZolin (ANCEF) IVPB 2g/100 mL premix     2 g 200 mL/hr over 30 Minutes Intravenous To ShortStay Surgical 11/12/17 1007 11/12/17 1215   11/12/17 1009  ceFAZolin (ANCEF) 2-4 GM/100ML-% IVPB    Comments:  Kathrene Bongo   : cabinet override      11/12/17 1009 11/12/17 1215     Subjective: Seen and examined at beside and was doing well. Denied any lightheadedness or dizziness. No CP or SOB.   Objective: Vitals:   11/13/17 0500 11/13/17 0600 11/13/17 0700 11/13/17 1220  BP: 94/73 105/75 102/73 124/79  Pulse: 80 82  88  Resp: 11 12 11  Temp:   (!) 97.5 F (36.4 C) 97.9 F (36.6 C)  TempSrc:   Oral Oral  SpO2: 95% 97% 98% 96%  Weight:      Height:        Intake/Output Summary (Last 24 hours) at 11/13/2017 1428 Last data filed at 11/13/2017 [id number] Gross per 24 hour  Intake 3446.83 ml  Output 884 ml  Net 2562.83 ml   Filed Weights   11/12/17 1011 11/12/17 2241  Weight: 92.5 kg (204 lb) 98.4 kg (216 lb 14.9 oz)   Examination: Physical Exam:  Constitutional: WN/WD AAM in NAD and appears calm and comfortable Eyes: Lids and conjunctivae normal, sclerae anicteric  ENMT:  External Ears, Nose appear normal. Grossly normal hearing. Mucous membranes are moist.  Neck: Appears normal, supple, no cervical masses, normal ROM, no appreciable thyromegaly, no JVD Respiratory: Clear to auscultation bilaterally, no wheezing, rales, rhonchi or crackles. Normal respiratory effort and patient is not tachypenic. No accessory muscle use.  Cardiovascular: RRR, no murmurs / rubs / gallops. S1 and S2 auscultated. No extremity edema.  Abdomen: Soft, non-tender, non-distended. No masses palpated. No appreciable hepatosplenomegaly. Bowel sounds positive x4.  GU: Deferred. Musculoskeletal: No clubbing / cyanosis of digits/nails. No joint deformity upper and lower extremities. Skin: No rashes, lesions, ulcers on a limited skin eval. Right Hip Dressings appear C/D/I. No induration; Warm and dry.  Neurologic: CN 2-12 grossly intact with no focal deficits. Romberg sign cerebellar reflexes not assessed.  Psychiatric: Normal judgment and insight. Alert and oriented x 3. Normal mood and appropriate affect.   Data Reviewed: I have personally reviewed following labs and imaging studies  CBC: Recent Labs  Lab 11/12/17 2106 11/13/17 0419  WBC 10.3 9.2  NEUTROABS  --  8.1*  HGB 11.7* 11.3*  HCT 36.0* 34.7*  MCV 92.1 92.5  PLT 205 189   Basic Metabolic Panel: Recent Labs  Lab 11/12/17 2106 11/13/17 0419  NA 135 134*  K 4.3 4.4  CL 105 104  CO2 21* 20*  GLUCOSE 228* 144*  BUN 14 15  CREATININE 1.15 0.96  CALCIUM 7.6* 8.2*   GFR: Estimated Creatinine Clearance: 98.9 mL/min (by C-G formula based on SCr of 0.96 mg/dL). Liver Function Tests: No results for input(s): AST, ALT, ALKPHOS, BILITOT, PROT, ALBUMIN in the last 168 hours. No results for input(s): LIPASE, AMYLASE in the last 168 hours. No results for input(s): AMMONIA in the last 168 hours. Coagulation Profile: No results for input(s): INR, PROTIME in the last 168 hours. Cardiac Enzymes: Recent Labs  Lab 11/12/17 2106  11/12/17 2355 11/13/17 0419 11/13/17 1002  TROPONINI <0.03 <0.03 <0.03 <0.03   BNP (last 3 results) No results for input(s): PROBNP in the last 8760 hours. HbA1C: No results for input(s): HGBA1C in the last 72 hours. CBG: Recent Labs  Lab 11/12/17 2051  GLUCAP 257*   Lipid Profile: No results for input(s): CHOL, HDL, LDLCALC, TRIG, CHOLHDL, LDLDIRECT in the last 72 hours. Thyroid Function Tests: No results for input(s): TSH, T4TOTAL, FREET4, T3FREE, THYROIDAB in the last 72 hours. Anemia Panel: No results for input(s): VITAMINB12, FOLATE, FERRITIN, TIBC, IRON, RETICCTPCT in the last 72 hours. Sepsis Labs: No results for input(s): PROCALCITON, LATICACIDVEN in the last 168 hours.  Recent Results (from the past 240 hour(s))  Surgical pcr screen     Status: None   Collection Time: 11/05/17 10:19 AM  Result Value Ref Range Status   MRSA, PCR NEGATIVE NEGATIVE Final   Staphylococcus aureus NEGATIVE NEGATIVE Final  Comment: (NOTE) The Xpert SA Assay (FDA approved for NASAL specimens in patients 55 years of age and older), is one component of a comprehensive surveillance program. It is not intended to diagnose infection nor to guide or monitor treatment.      Radiology Studies: Ct Head Wo Contrast  Result Date: 11/13/2017 CLINICAL DATA:  Syncopal episode.  Recent hip replacement. EXAM: CT HEAD WITHOUT CONTRAST TECHNIQUE: Contiguous axial images were obtained from the base of the skull through the vertex without intravenous contrast. COMPARISON:  None. FINDINGS: Brain: No evidence of acute infarction, hemorrhage, hydrocephalus, extra-axial collection or mass lesion/mass effect. Vascular: No hyperdense vessel or unexpected calcification. Skull: Normal. Negative for fracture or focal lesion. Sinuses/Orbits: Mild polypoid mucosal thickening of the maxillary and ethmoid sinuses. Other: None. IMPRESSION: No acute intracranial abnormality. Mild maxillary and ethmoid sinusitis.  Electronically Signed   By: Ted Mcalpineobrinka  Dimitrova M.D.   On: 11/13/2017 11:38   Dg Pelvis Portable  Result Date: 11/12/2017 CLINICAL DATA:  Right hip replacement EXAM: PORTABLE PELVIS 1-2 VIEWS COMPARISON:  10/15/2013 FINDINGS: Right hip replacement in satisfactory position and alignment. 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 IMPRESSION: Satisfactory right hip replacement. Electronically Signed   By: Marlan Palauharles  Clark M.D.   On: 11/12/2017 14:58   Dg C-arm 1-60 Min  Result Date: 11/12/2017 CLINICAL DATA:  Osteoarthritis of the right hip. Status post right total hip arthroplasty. EXAM: DG C-ARM 61-120 MIN; OPERATIVE RIGHT HIP WITH PELVIS COMPARISON:  Radiographs dated 09/10/2017 FINDINGS: AP views of the right hip demonstrate the patient has undergone right total hip arthroplasty. The components appear in excellent position in the AP projection. No fractures. IMPRESSION: Satisfactory appearance of the right hip in the AP projection after total hip arthroplasty. Electronically Signed   By: Francene BoyersJames  Maxwell M.D.   On: 11/12/2017 14:10   Dg Hip Operative Unilat W Or W/o Pelvis Right  Result Date: 11/12/2017 CLINICAL DATA:  Osteoarthritis of the right hip. Status post right total hip arthroplasty. EXAM: DG C-ARM 61-120 MIN; OPERATIVE RIGHT HIP WITH PELVIS COMPARISON:  Radiographs dated 09/10/2017 FINDINGS: AP views of the right hip demonstrate the patient has undergone right total hip arthroplasty. The components appear in excellent position in the AP projection. No fractures. IMPRESSION: Satisfactory appearance of the right hip in the AP projection after total hip arthroplasty. Electronically Signed   By: Francene BoyersJames  Maxwell M.D.   On: 11/12/2017 14:10   Scheduled Meds: . aspirin EC  325 mg Oral BID  . oxyCODONE  15 mg Oral Q12H   Continuous Infusions: . sodium chloride 75 mL/hr at 11/12/17 1817  . sodium chloride 150 mL/hr at 11/13/17 0613   . lactated ringers 10 mL/hr at 11/12/17 1019  . methocarbamol (ROBAXIN)  IV      LOS: 1 day   Merlene LaughterOmair Latif Andelyn Spade, DO [facility] Pager [phone]  If 7PM-7AM, please contact night-coverage [email or web address] Password [email or web address] 11/13/2017, 2:28 PM

## 2017-11-13 NOTE — Progress Notes (Signed)
   Subjective:  Patient reports pain as mild.   Vagal episode overnight.  Doing ok this morning.  Work up negative  Objective:   VITALS:   Vitals:   11/13/17 0200 11/13/17 0349 11/13/17 0500 11/13/17 0600  BP: 93/77 106/68 94/73 105/75  Pulse: 91 89 80 82  Resp: 12 14 11 12   Temp:  97.9 F (36.6 C)    TempSrc:  Oral    SpO2: 95% 95% 95% 97%  Weight:      Height:        Neurologically intact Neurovascular intact Sensation intact distally Intact pulses distally Dorsiflexion/Plantar flexion intact Incision: dressing C/D/I and no drainage No cellulitis present Compartment soft   Lab Results  Component Value Date   WBC 9.2 11/13/2017   HGB 11.3 (L) 11/13/2017   HCT 34.7 (L) 11/13/2017   MCV 92.5 11/13/2017   PLT 189 11/13/2017     Assessment/Plan:  1 Day Post-Op   - Expected postop acute blood loss anemia - will monitor for symptoms - Up with PT/OT - DVT ppx - SCDs, ambulation, aspirin - WBAT operative extremity - Pain control - vagal episode overnight - work up negative - no cardiac event - back to floor when appropriate  Glee ArvinMichael Kassy Mcenroe 11/13/2017, 8:02 AM [phone]

## 2017-11-13 NOTE — [place] FL2 (Signed)
[place] MEDICAID FL2 LEVEL OF CARE SCREENING TOOL     IDENTIFICATION  Patient Name: [name] Birthdate: 09-19-1961 Sex: male Admission Date (Current Location): 11/12/2017  [facility] [place] [place]   Facility and Address:  [facility], [address], [place], [place] [address]      Provider Number: [id number]  Attending Physician Name and Address:  Tarry KosXu, Naiping M, MD  Relative Name and Phone Number:       Current Level of Care: Hospital Recommended Level of Care: Skilled Nursing Facility Prior Approval Number:    Date Approved/Denied:   PASRR Number: [id number]  Discharge Plan: SNF    Current Diagnoses: Patient Active Problem List   Diagnosis Date Noted  . 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 Hip joint replacement status   . Preop exam for internal medicine 10/11/2017  . Pyuria 10/08/2017  . 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 Hypersomnolence 02/17/2015  . Syncope 02/16/2015  . Arthritis of right hip 09/05/2014  . Essential hypertension, benign 07/24/2014  . Preventative health care 07/22/2014  . Anxiety state, unspecified 07/22/2014  . Alcohol dependence with withdrawal, uncomplicated (HCC) 06/25/2014  . Left hip pain 05/07/2013    Orientation RESPIRATION BLADDER Height & Weight     Self, Time, Place, Situation  Normal Continent Weight: 216 lb 14.9 oz (98.4 kg) Height:  5\' 8"  (172.7 cm)  BEHAVIORAL SYMPTOMS/MOOD NEUROLOGICAL BOWEL NUTRITION STATUS      Continent Diet(regular)  AMBULATORY STATUS COMMUNICATION OF NEEDS Skin   Limited Assist Verbally Surgical wounds(hip- silver hydrofiber)                       Personal Care Assistance Level of Assistance  Bathing, Dressing Bathing Assistance: Limited assistance   Dressing Assistance: Limited assistance     Functional Limitations Info             SPECIAL CARE FACTORS FREQUENCY  PT (By licensed PT), OT (By licensed OT)      PT Frequency: 5/wk OT Frequency: 5/wk            Contractures      Additional Factors Info  Code Status, Allergies, Psychotropic Code Status Info: FULL Allergies Info: NKA Psychotropic Info: ativan         Current Medications (11/13/2017):  This is the current hospital active medication list Current Facility-Administered Medications  Medication Dose Route Frequency Provider Last Rate Last Dose  . 0.9 %  sodium chloride infusion   Intravenous Continuous Marguerita MerlesSheikh, Omair VineyardLatif, [place] 150 mL/hr at 11/13/17 [id number]    . acetaminophen (TYLENOL) tablet 650 mg  650 mg Oral Q4H PRN Tarry KosXu, Naiping M, MD       Or  . acetaminophen (TYLENOL) suppository 650 mg  650 mg Rectal Q4H PRN Tarry KosXu, Naiping M, MD      . alum & mag hydroxide-simeth (MAALOX/MYLANTA) 200-200-20 MG/5ML suspension 30 mL  30 mL Oral Q4H PRN Tarry KosXu, Naiping M, MD      . aspirin EC tablet 325 mg  325 mg Oral BID Tarry KosXu, Naiping M, MD      . diphenhydrAMINE (BENADRYL) 12.5 MG/5ML elixir 25 mg  25 mg Oral Q4H PRN Tarry KosXu, Naiping M, MD      . folic acid (FOLVITE) tablet 1 mg  1 mg Oral Daily Sheikh, Omair RoxtonLatif, [place]      . Influenza vac split quadrivalent PF (FLUARIX) injection 0.5 mL  0.5 mL Intramuscular Prior  to discharge Tarry KosXu, Naiping M, MD      . lactated ringers infusion   Intravenous Continuous Cristela BlueJackson, Kyle, MD 10 mL/hr at 11/12/17 1019    . LORazepam (ATIVAN) injection 0-4 mg  0-4 mg Intravenous Q6H Sheikh, Omair DusonLatif, DO       Followed by  . [START ON 11/15/2017] LORazepam (ATIVAN) injection 0-4 mg  0-4 mg Intravenous Q12H Sheikh, Omair [place] Ridge[place]if, DO      . LORazepam (ATIVAN) tablet 1 mg  1 mg Oral Q6H PRN Marguerita MerlesSheikh, Omair WantaghLatif, DO       Or  . LORazepam (ATIVAN) injection 1 mg  1 mg Intravenous Q6H PRN Sheikh, Omair Latif, DO      . magnesium citrate solution 1 Bottle  1 Bottle Oral Once PRN Tarry KosXu, Naiping M, MD      . menthol-cetylpyridinium (CEPACOL) lozenge 3 mg  1 lozenge Oral PRN Tarry KosXu, Naiping M, MD       Or  . phenol (CHLORASEPTIC) mouth spray 1  spray  1 spray Mouth/Throat PRN Tarry KosXu, Naiping M, MD      . methocarbamol (ROBAXIN) tablet 500 mg  500 mg Oral Q6H PRN Tarry KosXu, Naiping M, MD       Or  . methocarbamol (ROBAXIN) 500 mg in dextrose 5 % 50 mL IVPB  500 mg Intravenous Q6H PRN Tarry KosXu, Naiping M, MD      . metoCLOPramide (REGLAN) tablet 5-10 mg  5-10 mg Oral Q8H PRN Tarry KosXu, Naiping M, MD       Or  . metoCLOPramide (REGLAN) injection 5-10 mg  5-10 mg Intravenous Q8H PRN Tarry KosXu, Naiping M, MD      . morphine 2 MG/ML injection 1 mg  1 mg Intravenous Q2H PRN Tarry KosXu, Naiping M, MD   1 mg at 11/12/17 2029  . multivitamin with minerals tablet 1 tablet  1 tablet Oral Daily Sheikh, Omair Latif, DO      . ondansetron [facility](ZOFRAN) tablet 4 mg  4 mg Oral Q6H PRN Tarry KosXu, Naiping M, MD       Or  . ondansetron [facility](ZOFRAN) injection 4 mg  4 mg Intravenous Q6H PRN Tarry KosXu, Naiping M, MD      . oxyCODONE (Oxy IR/ROXICODONE) immediate release tablet 10 mg  10 mg Oral Q3H PRN Tarry KosXu, Naiping M, MD   10 mg at 11/13/17 0617  . oxyCODONE (Oxy IR/ROXICODONE) immediate release tablet 5 mg  5 mg Oral Q3H PRN Tarry KosXu, Naiping M, MD   5 mg at 11/12/17 1816  . oxyCODONE (OXYCONTIN) 12 hr tablet 15 mg  15 mg Oral Q12H Tarry KosXu, Naiping M, MD   15 mg at 11/13/17 [id number]  . polyethylene glycol (MIRALAX / GLYCOLAX) packet 17 g  17 g Oral Daily PRN Tarry KosXu, Naiping M, MD      . sorbitol 70 % solution 30 mL  30 mL Oral Daily PRN Tarry KosXu, Naiping M, MD      . thiamine (VITAMIN B-1) tablet 100 mg  100 mg Oral Daily Sheikh, Omair Latif, DO       Or  . thiamine (B-1) injection 100 mg  100 mg Intravenous Daily Marguerita MerlesSheikh, Omair ShorterLatif, DO         Discharge Medications: Please see discharge summary for a list of discharge medications.  Relevant Imaging Results:  Relevant Lab Results:   Additional Information SS#: [id number]  Burna SisUris, Patrycja Mumpower H, LCSW

## 2017-11-14 ENCOUNTER — Encounter (HOSPITAL_COMMUNITY): Payer: Self-pay | Admitting: Orthopaedic Surgery

## 2017-11-14 ENCOUNTER — Other Ambulatory Visit: Payer: Self-pay | Admitting: *Deleted

## 2017-11-14 DIAGNOSIS — K5909 Other constipation: Secondary | ICD-10-CM | POA: Diagnosis not present

## 2017-11-14 DIAGNOSIS — I255 Ischemic cardiomyopathy: Secondary | ICD-10-CM | POA: Diagnosis not present

## 2017-11-14 DIAGNOSIS — G8911 Acute pain due to trauma: Secondary | ICD-10-CM | POA: Diagnosis not present

## 2017-11-14 DIAGNOSIS — R2681 Unsteadiness on feet: Secondary | ICD-10-CM | POA: Diagnosis not present

## 2017-11-14 DIAGNOSIS — Z96649 Presence of unspecified artificial hip joint: Secondary | ICD-10-CM | POA: Diagnosis not present

## 2017-11-14 DIAGNOSIS — R278 Other lack of coordination: Secondary | ICD-10-CM | POA: Diagnosis not present

## 2017-11-14 DIAGNOSIS — Z471 Aftercare following joint replacement surgery: Secondary | ICD-10-CM | POA: Diagnosis not present

## 2017-11-14 DIAGNOSIS — R55 Syncope and collapse: Secondary | ICD-10-CM

## 2017-11-14 DIAGNOSIS — I10 Essential (primary) hypertension: Secondary | ICD-10-CM

## 2017-11-14 DIAGNOSIS — I82401 Acute embolism and thrombosis of unspecified deep veins of right lower extremity: Secondary | ICD-10-CM | POA: Diagnosis not present

## 2017-11-14 DIAGNOSIS — Z96651 Presence of right artificial knee joint: Secondary | ICD-10-CM | POA: Diagnosis not present

## 2017-11-14 DIAGNOSIS — S79911A Unspecified injury of right hip, initial encounter: Secondary | ICD-10-CM | POA: Diagnosis not present

## 2017-11-14 DIAGNOSIS — M1611 Unilateral primary osteoarthritis, right hip: Secondary | ICD-10-CM | POA: Diagnosis not present

## 2017-11-14 DIAGNOSIS — M6389 Disorders of muscle in diseases classified elsewhere, multiple sites: Secondary | ICD-10-CM | POA: Diagnosis not present

## 2017-11-14 DIAGNOSIS — Z96641 Presence of right artificial hip joint: Secondary | ICD-10-CM | POA: Diagnosis not present

## 2017-11-14 LAB — COMPREHENSIVE METABOLIC PANEL
ALBUMIN: 3.4 g/dL — AB (ref 3.5–5.0)
ALT: 16 U/L — AB (ref 17–63)
AST: 29 U/L (ref 15–41)
Alkaline Phosphatase: 47 U/L (ref 38–126)
Anion gap: 6 (ref 5–15)
BUN: 11 mg/dL (ref 6–20)
CHLORIDE: 105 mmol/L (ref 101–111)
CO2: 25 mmol/L (ref 22–32)
CREATININE: 0.78 mg/dL (ref 0.61–1.24)
Calcium: 8.6 mg/dL — ABNORMAL LOW (ref 8.9–10.3)
GFR calc Af Amer: >60 mL/min (ref >60)
Glucose, Bld: 110 mg/dL — ABNORMAL HIGH (ref 65–99)
POTASSIUM: 3.2 mmol/L — AB (ref 3.5–5.1)
SODIUM: 136 mmol/L (ref 135–145)
Total Bilirubin: 0.3 mg/dL (ref 0.3–1.2)
Total Protein: 6 g/dL — ABNORMAL LOW (ref 6.5–8.1)

## 2017-11-14 LAB — CBC WITH DIFFERENTIAL/PLATELET
BASOS ABS: 0 10*3/uL (ref 0.0–0.1)
BASOS PCT: 0 %
Eosinophils Absolute: 0 10*3/uL (ref 0.0–0.7)
Eosinophils Relative: 0 %
HCT: 31.1 % — ABNORMAL LOW (ref 39.0–52.0)
HEMOGLOBIN: 10.2 g/dL — AB (ref 13.0–17.0)
LYMPHS PCT: 11 %
Lymphs Abs: 1.2 10*3/uL (ref 0.7–4.0)
MCH: 30.1 pg (ref 26.0–34.0)
MCHC: 32.8 g/dL (ref 30.0–36.0)
MCV: 91.7 fL (ref 78.0–100.0)
MONOS PCT: 8 %
Monocytes Absolute: 0.9 10*3/uL (ref 0.1–1.0)
NEUTROS ABS: 9 10*3/uL — AB (ref 1.7–7.7)
NEUTROS PCT: 81 %
Platelets: 193 10*3/uL (ref 150–400)
RBC: 3.39 MIL/uL — ABNORMAL LOW (ref 4.22–5.81)
RDW: 14.3 % (ref 11.5–15.5)
WBC: 11.2 10*3/uL — ABNORMAL HIGH (ref 4.0–10.5)

## 2017-11-14 LAB — HEMOGLOBIN A1C
HEMOGLOBIN A1C: 5.5 % (ref 4.8–5.6)
MEAN PLASMA GLUCOSE: 111.15 mg/dL

## 2017-11-14 LAB — GLUCOSE, CAPILLARY
Glucose-Capillary: 138 mg/dL — ABNORMAL HIGH (ref 65–99)
Glucose-Capillary: 146 mg/dL — ABNORMAL HIGH (ref 65–99)

## 2017-11-14 LAB — MAGNESIUM: MAGNESIUM: 1.9 mg/dL (ref 1.7–2.4)

## 2017-11-14 LAB — PHOSPHORUS: Phosphorus: 3 mg/dL (ref 2.5–4.6)

## 2017-11-14 MED ORDER — POTASSIUM CHLORIDE 20 MEQ PO PACK
40.0000 meq | PACK | Freq: Three times a day (TID) | ORAL | Status: DC
  Administered 2017-11-14 (×2): 40 meq via ORAL
  Filled 2017-11-14 (×3): qty 2

## 2017-11-14 NOTE — Social Work (Signed)
Clinical Social Worker facilitated patient discharge including contacting patient family and facility to confirm patient discharge plans.  Clinical information faxed to facility and family agreeable with plan.    CSW arranged ambulance transport via [facility] to [facility].    RN to call [phone] to give report prior to discharge.  Pt going to Room 806A.  Clinical Social Worker will sign off for now as social work intervention is no longer needed. Please consult us again if new need arises.  Keene BreathPatricia Leny Morozov, LCSW Clinical Social Worker [phone]

## 2017-11-14 NOTE — Plan of Care (Signed)
  Elimination: Will not experience complications related to bowel motility 11/14/2017 1241 - Progressing by Darrow BussingArcilla, Harshita Bernales M, RN   Pain Managment: General experience of comfort will improve 11/14/2017 1241 - Progressing by Darrow BussingArcilla, Aevah Stansbery M, RN   Safety: Ability to remain free from injury will improve 11/14/2017 1241 - Progressing by Darrow BussingArcilla, Jenyfer Trawick M, RN

## 2017-11-14 NOTE — Progress Notes (Signed)
Consult progress note                                                                              Patient Demographics  [name], is a 56 y.o. male, DOB - [birth year], ZOX:[id number]  Admit date - 11/12/2017   Admitting Physician Naiping Donnelly Stager, MD  Outpatient Primary MD for the patient is [name], MD  Outpatient specialists:   LOS - 2  days   Medical records reviewed and are as summarized below:    No chief complaint on file.      Brief summary  Shawn Heavner Wilhiteis a 56 y.o.malewith medical history significant ofrecent hip repair. Earlier yesterday was getting to his room from recovery when he began to get very diaphoretic because he had to urinate and his hip was hurting a lot. He says he started to get very agitated and anxious and then had to urinate so he got up and got dizzy and passed out per RN staff. He does not remember the actual syncope. He laid back down and he has been feeling fine but his sbp dropped into the high 60 range with normal heart rate.He received IVF boluses and his bp began responding. TRH was asked to consult for his Syncope by Orthopedic Surgery.    Assessment & Plan    Principal Problem:  DJD status post right hip replacement  -Disposition plan per primary service  Active problems   Syncope likely vasovagal in the setting of micturition syncope, hypovolemia/ABLA -Recent echo 11/30 showed EF of 55-60% with normal wall motion, no regional wall motion abnormalities, grade 1 diastolic dysfunction -Troponins negative, CT head showed no acute intracranial abnormality.  Not orthostatic anymore.  Received IV fluid hydration. -No arrhythmias on tele, feels back to baseline.     Essential hypertension, benign -Currently stable  DJD status post right hip replacement  -Disposition plan per primary service  History of alcohol use -Not in any withdrawals.  Hyponatremia -Mild hyponatremia 134 on 12/20,  resolved  Hypokalemia -Replaced today   Code Status: Full CODE STATUS DVT Prophylaxis:   Family Communication: Discussed in detail with the patient, all imaging results, lab results explained to the patient   Disposition Plan: Patient is medically stable for discharge, disposition per primary service.  I will sign off.  Time Spent in minutes   25 minutes  Procedures:  Right hip total arthroplasty on 12/19    Medications  Scheduled Meds: . aspirin EC  325 mg Oral BID  . folic acid  1 mg Oral Daily  . LORazepam  0-4 mg Intravenous Q6H   Followed by  . [START ON 11/15/2017] LORazepam  0-4 mg Intravenous Q12H  . multivitamin with minerals  1 tablet Oral Daily  . oxyCODONE  15 mg Oral Q12H  . potassium chloride  40 mEq Oral TID  . thiamine  100 mg Oral Daily   Or  . thiamine  100 mg Intravenous Daily   Continuous Infusions: . lactated ringers 10 mL/hr at 11/12/17 1019  . methocarbamol (ROBAXIN)  IV     PRN Meds:.acetaminophen **OR** acetaminophen, alum & mag hydroxide-simeth, diphenhydrAMINE, Influenza vac  split quadrivalent PF, LORazepam **OR** LORazepam, magnesium citrate, menthol-cetylpyridinium **OR** phenol, methocarbamol **OR** methocarbamol (ROBAXIN)  IV, metoCLOPramide **OR** metoCLOPramide (REGLAN) injection, morphine injection, ondansetron **OR** ondansetron (ZOFRAN) IV, oxyCODONE, oxyCODONE, polyethylene glycol, sorbitol   Antibiotics   Anti-infectives (From admission, onward)   Start     Dose/Rate Route Frequency Ordered Stop   11/12/17 1800  ceFAZolin (ANCEF) IVPB 2g/100 mL premix     2 g 200 mL/hr over 30 Minutes Intravenous Every 6 hours 11/12/17 1657 11/13/17 0642   11/12/17 1348  vancomycin (VANCOCIN) powder  Status:  Discontinued       As needed 11/12/17 1349 11/12/17 1412   11/12/17 1015  ceFAZolin (ANCEF) IVPB 2g/100 mL premix     2 g 200 mL/hr over 30 Minutes Intravenous To ShortStay Surgical 11/12/17 1007 11/12/17 1215   11/12/17 1009  ceFAZolin  (ANCEF) 2-4 GM/100ML-% IVPB    Comments:  Kathrene BongoSmith, Catherine   : cabinet override      11/12/17 1009 11/12/17 1215        Subjective:   Madelyn BrunnerStevie Wohler was seen and examined today.  No complaints.  No repeat syncopal episode. Patient denies dizziness, chest pain, shortness of breath, abdominal pain, N/V/D/C, new weakness, numbess, tingling. No acute events overnight.    Objective:   Vitals:   11/13/17 2023 11/13/17 2356 11/14/17 0516 11/14/17 0530  BP: 118/84 111/77 102/63 102/63  Pulse: 70 88 95 95  Resp: 16 16 16    Temp: 98.2 F (36.8 C) 97.7 F (36.5 C) 98.1 F (36.7 C)   TempSrc: Oral Oral Oral   SpO2: 100% 100% 100%   Weight:      Height:        Intake/Output Summary (Last 24 hours) at 11/14/2017 1109 Last data filed at 11/14/2017 0900 Gross per 24 hour  Intake 2121.25 ml  Output -  Net 2121.25 ml     Wt Readings from Last 3 Encounters:  11/12/17 98.4 kg (216 lb 14.9 oz)  11/05/17 92.8 kg (204 lb 9.6 oz)  10/22/17 96.6 kg (213 lb)     Exam  General: Alert and oriented x 3, NAD  Eyes:  HEENT:  Atraumatic, normocephalic  Cardiovascular: S1 S2 auscultated, no rubs, murmurs or gallops. Regular rate and rhythm.  Respiratory: Clear to auscultation bilaterally, no wheezing, rales or rhonchi  Gastrointestinal: Soft, nontender, nondistended, + bowel sounds  Ext: no pedal edema bilaterally  Neuro: no new deficits  Musculoskeletal: No digital cyanosis, clubbing  Skin: No rashes  Psych: Normal affect and demeanor, alert and oriented x3    Data Reviewed:  I have personally reviewed following labs and imaging studies  Micro Results Recent Results (from the past 240 hour(s))  Surgical pcr screen     Status: None   Collection Time: 11/05/17 10:19 AM  Result Value Ref Range Status   MRSA, PCR NEGATIVE NEGATIVE Final   Staphylococcus aureus NEGATIVE NEGATIVE Final    Comment: (NOTE) The Xpert SA Assay (FDA approved for NASAL specimens in patients  55 years of age and older), is one component of a comprehensive surveillance program. It is not intended to diagnose infection nor to guide or monitor treatment.     Radiology Reports Ct Head Wo Contrast  Result Date: 11/13/2017 CLINICAL DATA:  Syncopal episode.  Recent hip replacement. EXAM: CT HEAD WITHOUT CONTRAST TECHNIQUE: Contiguous axial images were obtained from the base of the skull through the vertex without intravenous contrast. COMPARISON:  None. FINDINGS: Brain: No evidence of acute infarction, hemorrhage,  hydrocephalus, extra-axial collection or mass lesion/mass effect. Vascular: No hyperdense vessel or unexpected calcification. Skull: Normal. Negative for fracture or focal lesion. Sinuses/Orbits: Mild polypoid mucosal thickening of the maxillary and ethmoid sinuses. Other: None. IMPRESSION: No acute intracranial abnormality. Mild maxillary and ethmoid sinusitis. Electronically Signed   By: Ted Mcalpineobrinka  Dimitrova M.D.   On: 11/13/2017 11:38   Dg Pelvis Portable  Result Date: 11/12/2017 CLINICAL DATA:  Right hip replacement EXAM: PORTABLE PELVIS 1-2 VIEWS COMPARISON:  10/15/2013 FINDINGS: Right hip replacement in satisfactory position and alignment. 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 IMPRESSION: Satisfactory right hip replacement. Electronically Signed   By: Marlan Palauharles  Clark M.D.   On: 11/12/2017 14:58   Dg C-arm 1-60 Min  Result Date: 11/12/2017 CLINICAL DATA:  Osteoarthritis of the right hip. Status post right total hip arthroplasty. EXAM: DG C-ARM 61-120 MIN; OPERATIVE RIGHT HIP WITH PELVIS COMPARISON:  Radiographs dated 09/10/2017 FINDINGS: AP views of the right hip demonstrate the patient has undergone right total hip arthroplasty. The components appear in excellent position in the AP projection. No fractures. IMPRESSION: Satisfactory appearance of the right hip in the AP projection after total hip  arthroplasty. Electronically Signed   By: Francene BoyersJames  Maxwell M.D.   On: 11/12/2017 14:10   Dg Hip Operative Unilat W Or W/o Pelvis Right  Result Date: 11/12/2017 CLINICAL DATA:  Osteoarthritis of the right hip. Status post right total hip arthroplasty. EXAM: DG C-ARM 61-120 MIN; OPERATIVE RIGHT HIP WITH PELVIS COMPARISON:  Radiographs dated 09/10/2017 FINDINGS: AP views of the right hip demonstrate the patient has undergone right total hip arthroplasty. The components appear in excellent position in the AP projection. No fractures. IMPRESSION: Satisfactory appearance of the right hip in the AP projection after total hip arthroplasty. Electronically Signed   By: Francene BoyersJames  Maxwell M.D.   On: 11/12/2017 14:10    Lab Data:  CBC: Recent Labs  Lab 11/12/17 2106 11/13/17 0419 11/14/17 0323  WBC 10.3 9.2 11.2*  NEUTROABS  --  8.1* 9.0*  HGB 11.7* 11.3* 10.2*  HCT 36.0* 34.7* 31.1*  MCV 92.1 92.5 91.7  PLT 205 189 193   Basic Metabolic Panel: Recent Labs  Lab 11/12/17 2106 11/13/17 0419 11/14/17 0323  NA 135 134* 136  K 4.3 4.4 3.2*  CL 105 104 105  CO2 21* 20* 25  GLUCOSE 228* 144* 110*  BUN 14 15 11   CREATININE 1.15 0.96 0.78  CALCIUM 7.6* 8.2* 8.6*  MG  --   --  1.9  PHOS  --   --  3.0   GFR: Estimated Creatinine Clearance: 118.6 mL/min (by C-G formula based on SCr of 0.78 mg/dL). Liver Function Tests: Recent Labs  Lab 11/14/17 0323  AST 29  ALT 16*  ALKPHOS 47  BILITOT 0.3  PROT 6.0*  ALBUMIN 3.4*   No results for input(s): LIPASE, AMYLASE in the last 168 hours. No results for input(s): AMMONIA in the last 168 hours. Coagulation Profile: No results for input(s): INR, PROTIME in the last 168 hours. Cardiac Enzymes: Recent Labs  Lab 11/12/17 2106 11/12/17 2355 11/13/17 0419 11/13/17 1002  TROPONINI <0.03 <0.03 <0.03 <0.03   BNP (last 3 results) No results for input(s): PROBNP in the last 8760 hours. HbA1C: Recent Labs    11/14/17 0323  HGBA1C 5.5    CBG: Recent Labs  Lab 11/12/17 2051 11/13/17 2020 11/14/17 0510  GLUCAP 257* 160* 138*   Lipid Profile: No results for input(s): CHOL, HDL, LDLCALC,  TRIG, CHOLHDL, LDLDIRECT in the last 72 hours. Thyroid Function Tests: No results for input(s): TSH, T4TOTAL, FREET4, T3FREE, THYROIDAB in the last 72 hours. Anemia Panel: No results for input(s): VITAMINB12, FOLATE, FERRITIN, TIBC, IRON, RETICCTPCT in the last 72 hours. Urine analysis:    Component Value Date/Time   COLORURINE YELLOW 11/05/2017 1018   APPEARANCEUR CLEAR 11/05/2017 1018   LABSPEC 1.019 11/05/2017 1018   PHURINE 7.0 11/05/2017 1018   GLUCOSEU NEGATIVE 11/05/2017 1018   GLUCOSEU NEGATIVE 10/08/2017 1441   HGBUR NEGATIVE 11/05/2017 1018   BILIRUBINUR NEGATIVE 11/05/2017 1018   KETONESUR NEGATIVE 11/05/2017 1018   PROTEINUR NEGATIVE 11/05/2017 1018   UROBILINOGEN 0.2 10/08/2017 1441   NITRITE NEGATIVE 11/05/2017 1018   LEUKOCYTESUR NEGATIVE 11/05/2017 1018     Jamille Yoshino M.D. [facility] 11/14/2017, 11:09 AM  Pager: [phone] Between 7am to 7pm - call Pager - [phone]  After 7pm go to [email or web address] - password [email or web address]  Call night coverage person covering after 7pm

## 2017-11-14 NOTE — Progress Notes (Signed)
Physical Therapy Treatment Patient Details Name: Shawn MooresStevie E Richner MRN: [id number] DOB: 01-14-1961 Today's Date: 11/14/2017    History of Present Illness Pt is a 56 y.o. male now s/p R THA (direct ant approach) on 11/12/17. Pt had post-op syncopal episode, with suspected vagal reaction; no focal neurological deficits. Other PMH includes L THA (2014), cardiomyopathy, anxiety.    PT Comments    Patient c/o increased pain today. Pt required supervision/min guard overall and able to tolerate ambulating 11400ft and standing therex. Pt does demonstrate decreased gait velocity and decreased R foot clearance. Continue to progress as tolerated.    Follow Up Recommendations  SNF;Supervision - Intermittent     Equipment Recommendations  Rolling walker with 5" wheels    Recommendations for Other Services       Precautions / Restrictions Precautions Precautions: Fall Restrictions Weight Bearing Restrictions: Yes RLE Weight Bearing: Weight bearing as tolerated    Mobility  Bed Mobility Overal bed mobility: Modified Independent             General bed mobility comments: Increased time and effort with HOB elevated and use of rail; cues for sequencing and technique  Transfers Overall transfer level: Needs assistance Equipment used: Rolling walker (2 wheeled) Transfers: Sit to/from Stand Sit to Stand: Min guard         General transfer comment: min guard for safety; cues for safe hand placement  Ambulation/Gait Ambulation/Gait assistance: Supervision Ambulation Distance (Feet): 100 Feet Assistive device: Rolling walker (2 wheeled) Gait Pattern/deviations: Decreased weight shift to right;Antalgic;Trunk flexed;Step-to pattern;Decreased stance time - right;Decreased step length - left Gait velocity: slow Gait velocity interpretation: Below normal speed for age/gender General Gait Details: cues for R heel strike and increased stride length   Stairs            Wheelchair  Mobility    Modified Rankin (Stroke Patients Only)       Balance Overall balance assessment: Needs assistance   Sitting balance-Leahy Scale: Good       Standing balance-Leahy Scale: Fair                              Cognition Arousal/Alertness: Awake/alert Behavior During Therapy: WFL for tasks assessed/performed Overall Cognitive Status: Within Functional Limits for tasks assessed                                        Exercises Total Joint Exercises Hip ABduction/ADduction: Right;5 reps;AAROM Knee Flexion: Right;10 reps;AROM;Standing Marching in Standing: AROM;Right;10 reps;Standing Standing Hip Extension: AROM;Right;10 reps;Standing    General Comments        Pertinent Vitals/Pain Pain Assessment: Faces Faces Pain Scale: Hurts even more Pain Location: R hip Pain Descriptors / Indicators: Sore;Aching;Grimacing;Guarding Pain Intervention(s): Limited activity within patient's tolerance;Monitored during session;Premedicated before session;Repositioned    Home Living                      Prior Function            PT Goals (current goals can now be found in the care plan section) Acute Rehab PT Goals Patient Stated Goal: Receive rehab before returning home alone Progress towards PT goals: Progressing toward goals    Frequency    7X/week      PT Plan Current plan remains appropriate    Co-evaluation  AM-PAC PT "6 Clicks" Daily Activity  Outcome Measure  Difficulty turning over in bed (including adjusting bedclothes, sheets and blankets)?: None Difficulty moving from lying on back to sitting on the side of the bed? : None Difficulty sitting down on and standing up from a chair with arms (e.g., wheelchair, bedside commode, etc,.)?: Unable Help needed moving to and from a bed to chair (including a wheelchair)?: A Little Help needed walking in hospital room?: A Little Help needed climbing 3-5 steps  with a railing? : A Little 6 Click Score: 18    End of Session Equipment Utilized During Treatment: Gait belt Activity Tolerance: Patient tolerated treatment well;Patient limited by pain Patient left: with call bell/phone within reach;in chair Nurse Communication: Mobility status PT Visit Diagnosis: Other abnormalities of gait and mobility (R26.89);Pain Pain - Right/Left: Right Pain - part of body: Hip     Time: 1207-1237 PT Time Calculation (min) (ACUTE ONLY): 30 min  Charges:  $Gait Training: 8-22 mins $Therapeutic Exercise: 8-22 mins                    G Codes:       Erline LevineKellyn Jasmen Emrich, PTA Pager: [phone]     Carolynne EdouardKellyn R Franci Oshana 11/14/2017, 2:14 PM

## 2017-11-14 NOTE — Patient Outreach (Signed)
[facility]) Care Management  11/14/2017  [name] 1961/01/04 [id number]   CSW spoke with patient who is in the hospital and has been approved and accepted to SN[facility] re[facility].  He is pleased and encouraged to [facility] hard [facility].  CSW will plan SNF follow up next week.   Reece LevyJanet Eryk Beavers, MSW, LCSW Clinical Social Worker  [facility] [phone]

## 2017-11-14 NOTE — Care Management Note (Signed)
Case Management Note  Patient Details  Name: [name] MRN: [id number] Date of Birth: 1961-05-03  Subjective/Objective:                 Patient will DC to SNF today as facilitated by CSW.    Action/Plan:   Expected Discharge Date:  11/14/17               Expected Discharge Plan:  Skilled Nursing Facility  In-House Referral:  Clinical Social Work  Discharge planning Services  CM Consult  Post Acute Care Choice:    Choice offered to:     DME Arranged:    DME Agency:     HH Arranged:    HH Agency:     Status of Service:  Completed, signed off  If discussed at [employer] Meetings, dates discussed:    Additional Comments:  Lawerance SabalDebbie Cassie Shedlock, RN 11/14/2017, 12:13 PM

## 2017-11-14 NOTE — Discharge Summary (Signed)
Physician Discharge Summary      Patient ID: [name] MRN: [id number] DOB/AGE: 11/26/1960 56 y.o.  Admit date: 11/12/2017 Discharge date: 11/14/2017  Admission Diagnoses:  Syncope  Discharge Diagnoses:  Principal Problem:   Syncope Active Problems:   Essential hypertension, benign   History of hip replacement   Hip joint replacement status   Past Medical History:  Diagnosis Date  . Alcohol abuse    in the past  . Anxiety   . Cardiomyopathy (HCC)    hx of    Surgeries: Procedure(s): RIGHT TOTAL HIP ARTHROPLASTY ANTERIOR APPROACH on 11/12/2017   Consultants (if any):   Discharged Condition: Improved  Hospital Course: [name] is an 56 y.o. male who was admitted 11/12/2017 with a diagnosis of Syncope and went to the operating room on 11/12/2017 and underwent the above named procedures.    He was given perioperative antibiotics:  Anti-infectives (From admission, onward)   Start     Dose/Rate Route Frequency Ordered Stop   11/12/17 1800  ceFAZolin (ANCEF) IVPB 2g/100 mL premix     2 g 200 mL/hr over 30 Minutes Intravenous Every 6 hours 11/12/17 1657 11/13/17 0642   11/12/17 1348  vancomycin (VANCOCIN) powder  Status:  Discontinued       As needed 11/12/17 1349 11/12/17 1412   11/12/17 1015  ceFAZolin (ANCEF) IVPB 2g/100 mL premix     2 g 200 mL/hr over 30 Minutes Intravenous To ShortStay [facility] 11/12/17 1007 11/12/17 1215   11/12/17 1009  ceFAZolin (ANCEF) 2-4 GM/100ML-% IVPB    Comments:  Kathrene BongoSmith, Catherine   : cabinet override      11/12/17 1009 11/12/17 1215    .  He was given sequential compression devices, early ambulation, and aspirin for DVT prophylaxis.  He benefited maximally from the hospital stay and there were no complications.    Recent vital signs:  Vitals:   11/14/17 0516 11/14/17 0530  BP: 102/63 102/63  Pulse: 95 95  Resp: 16   Temp: 98.1 F (36.7 C)   SpO2: 100%     Recent laboratory studies:  Lab Results    Component Value Date   HGB 10.2 (L) 11/14/2017   HGB 11.3 (L) 11/13/2017   HGB 11.7 (L) 11/12/2017   Lab Results  Component Value Date   WBC 11.2 (H) 11/14/2017   PLT 193 11/14/2017   Lab Results  Component Value Date   INR 1.00 11/05/2017   Lab Results  Component Value Date   NA 136 11/14/2017   K 3.2 (L) 11/14/2017   CL 105 11/14/2017   CO2 25 11/14/2017   BUN 11 11/14/2017   CREATININE 0.78 11/14/2017   GLUCOSE 110 (H) 11/14/2017    Discharge Medications:   Allergies as of 11/14/2017   No Known Allergies     Medication List    TAKE these medications   aspirin EC 325 MG tablet Take 1 tablet (325 mg total) by mouth 2 (two) times daily.   HYDROcodone-acetaminophen 5-325 MG tablet Commonly known as:  NORCO/VICODIN Take 1-2 tablets by mouth every 6 hours as needed for pain and/or cough.   ondansetron 4 MG tablet Commonly known as:  ZOFRAN Take 1-2 tablets (4-8 mg total) by mouth every 8 (eight) hours as needed for nausea or vomiting.   oxyCODONE 10 mg 12 hr tablet Commonly known as:  OXYCONTIN Take 1 tablet (10 mg total) by mouth every 12 (twelve) hours.   oxyCODONE-acetaminophen 5-325 MG tablet Commonly known as:  PERCOCET Take 1-2 tablets by mouth every 4 (four) hours as needed for severe pain.   promethazine 25 MG tablet Commonly known as:  PHENERGAN Take 1 tablet (25 mg total) by mouth every 6 (six) hours as needed for nausea.   senna-docusate 8.6-50 MG tablet Commonly known as:  SENOKOT S Take 1 tablet by mouth at bedtime as needed.   tiZANidine 4 MG tablet Commonly known as:  ZANAFLEX Take 1 tablet (4 mg total) by mouth every 6 (six) hours as needed for muscle spasms.   traMADol 50 MG tablet Commonly known as:  ULTRAM Take 1-2 tablets (50-100 mg total) by mouth 3 (three) times daily as needed.            Durable Medical Equipment  (From admission, onward)        Start     Ordered   11/12/17 1657  DME Walker rolling  Once     Question:  Patient needs a walker to treat with the following condition  Answer:  History of hip replacement   11/12/17 1657   11/12/17 1657  DME 3 n 1  Once     11/12/17 1657   11/12/17 1657  DME Bedside commode  Once    Question:  Patient needs a bedside commode to treat with the following condition  Answer:  History of hip replacement   11/12/17 1657      Diagnostic Studies: Ct Head Wo Contrast  Result Date: 11/13/2017 CLINICAL DATA:  Syncopal episode.  Recent hip replacement. EXAM: CT HEAD WITHOUT CONTRAST TECHNIQUE: Contiguous axial images were obtained from the base of the skull through the vertex without intravenous contrast. COMPARISON:  None. FINDINGS: Brain: No evidence of acute infarction, hemorrhage, hydrocephalus, extra-axial collection or mass lesion/mass effect. Vascular: No hyperdense vessel or unexpected calcification. Skull: Normal. Negative for fracture or focal lesion. Sinuses/Orbits: Mild polypoid mucosal thickening of the maxillary and ethmoid sinuses. Other: None. IMPRESSION: No acute intracranial abnormality. Mild maxillary and ethmoid sinusitis. Electronically Signed   By: Ted Mcalpineobrinka  Dimitrova M.D.   On: 11/13/2017 11:38   Dg Pelvis Portable  Result Date: 11/12/2017 CLINICAL DATA:  Right hip replacement EXAM: PORTABLE PELVIS 1-2 VIEWS COMPARISON:  10/15/2013 FINDINGS: Right hip replacement in satisfactory position and alignment. 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 IMPRESSION: Satisfactory right hip replacement. Electronically Signed   By: Marlan Palauharles  Clark M.D.   On: 11/12/2017 14:58   Dg C-arm 1-60 Min  Result Date: 11/12/2017 CLINICAL DATA:  Osteoarthritis of the right hip. Status post right total hip arthroplasty. EXAM: DG C-ARM 61-120 MIN; OPERATIVE RIGHT HIP WITH PELVIS COMPARISON:  Radiographs dated 09/10/2017 FINDINGS: AP views of the right hip demonstrate the patient has undergone right total  hip arthroplasty. The components appear in excellent position in the AP projection. No fractures. IMPRESSION: Satisfactory appearance of the right hip in the AP projection after total hip arthroplasty. Electronically Signed   By: Francene BoyersJames  Maxwell M.D.   On: 11/12/2017 14:10   Dg Hip Operative Unilat W Or W/o Pelvis Right  Result Date: 11/12/2017 CLINICAL DATA:  Osteoarthritis of the right hip. Status post right total hip arthroplasty. EXAM: DG C-ARM 61-120 MIN; OPERATIVE RIGHT HIP WITH PELVIS COMPARISON:  Radiographs dated 09/10/2017 FINDINGS: AP views of the right hip demonstrate the patient has undergone right total hip arthroplasty. The components appear in excellent position in the AP projection. No fractures. IMPRESSION: Satisfactory appearance of the right hip in the AP projection after  total hip arthroplasty. Electronically Signed   By: Francene Boyers M.D.   On: 11/12/2017 14:10    Disposition: 01-Home or Self Care  Discharge Instructions    Call MD / Call 911   Complete by:  As directed    If you experience chest pain or shortness of breath, CALL 911 and be transported to the hospital emergency room.  If you develope a fever above 101.5 F, pus (white drainage) or increased drainage or redness at the wound, or calf pain, call your surgeon's office.   Constipation Prevention   Complete by:  As directed    Drink plenty of fluids.  Prune juice may be helpful.  You may use a stool softener, such as Colace (over the counter) 100 mg twice a day.  Use MiraLax (over the counter) for constipation as needed.   Driving restrictions   Complete by:  As directed    No driving while taking narcotic pain meds.   Increase activity slowly as tolerated   Complete by:  As directed       Follow-up Information    Tarry Kos, MD In 2 weeks.   Specialty:  Orthopedic Surgery Why:  For suture removal, For wound re-check Contact information: [address] [place]  [address] [phone]            Signed: Glee Arvin 11/14/2017, 7:30 AM

## 2017-11-14 NOTE — Progress Notes (Signed)
   Subjective:  Patient reports pain as mild.   No events.   Objective:   VITALS:   Vitals:   11/13/17 2023 11/13/17 2356 11/14/17 0516 11/14/17 0530  BP: 118/84 111/77 102/63 102/63  Pulse: 70 88 95 95  Resp: 16 16 16    Temp: 98.2 F (36.8 C) 97.7 F (36.5 C) 98.1 F (36.7 C)   TempSrc: Oral Oral Oral   SpO2: 100% 100% 100%   Weight:      Height:        Neurologically intact Neurovascular intact Sensation intact distally Intact pulses distally Dorsiflexion/Plantar flexion intact Incision: dressing C/D/I and no drainage No cellulitis present Compartment soft   Lab Results  Component Value Date   WBC 11.2 (H) 11/14/2017   HGB 10.2 (L) 11/14/2017   HCT 31.1 (L) 11/14/2017   MCV 91.7 11/14/2017   PLT 193 11/14/2017     Assessment/Plan:  2 Days Post-Op   - up with PT - CT head neg - dc to SNF when bed available  Glee ArvinMichael Lavoy Bernards 11/14/2017, 7:30 AM [phone]

## 2017-11-14 NOTE — Clinical Social Work Placement (Signed)
   CLINICAL SOCIAL WORK PLACEMENT  NOTE  Date:  11/14/2017  Patient Details  Name: Marcello MooresStevie E Dia MRN: [id number] Date of Birth: April 26, 1961  Clinical Social Work is seeking post-discharge placement for this patient at the Skilled  Nursing Facility level of care (*CSW will initial, date and re-position this form in  chart as items are completed):  Yes   Patient/family provided with [facility] Clinical Social Work Department's list of facilities offering this level of care within the geographic area requested by the patient (or if unable, by the patient's family).  Yes   Patient/family informed of their freedom to choose among providers that offer the needed level of care, that participate in Medicare, Medicaid or managed care program needed by the patient, have an available bed and are willing to accept the patient.  Yes   Patient/family informed of [facility]'s ownership interest in [facility] and [facility], as well as of the fact that they are under no obligation to receive care at these facilities.  PASRR submitted to EDS on       PASRR number received on       Existing PASRR number confirmed on 11/13/17     FL2 transmitted to all facilities in geographic area requested by pt/family on       FL2 transmitted to all facilities within larger geographic area on 11/13/17     Patient informed that his/her managed care company has contracts with or will negotiate with certain facilities, including the following:        Yes   Patient/family informed of bed offers received.  Patient chooses bed at [facility] [place]     Physician recommends and patient chooses bed at      Patient to be transferred to [facility] [place] on 11/14/17.  Patient to be transferred to facility by [facility]     Patient family notified on 11/14/17 of transfer.  Name of family member notified:  pt responsible for self     PHYSICIAN       Additional Comment:     _______________________________________________ Tresa MoorePatricia V Madden Garron, LCSW 11/14/2017, 2:41 PM

## 2017-11-14 NOTE — Social Work (Addendum)
CSW was advsied by SNF that they will not be able to get Insurance Auth for SNF placement as patient has walked 250 ft.   CSW met with patient at bedside to discuss disposition and pt not pleased and indicated that he has 6 steps to get into his home. Pt reiterated that he has no one to help him at home. Pt did share that when he had previous impairment he returned home with home health but does not feel he is able to return home given new impairment  CSW will see if PT can meet with patient again.  CSW will f/u clinical team on same.  10:15am: CSW received a call from [place], Marcie Bal at [phone], advising that patient called her to discuss possibility of pt not being approved by insruance due to progress. CSW discussed conversation with SNF and patient making patient aware. [facility] requested pt be evaluated by OT as well. CSW will f/u with RN on floor to ask doctor to put in order if he can. CSW advised Marcie Bal, that CSW is f/u with PT to meet patient again. [facility] inquired of the appeal process. CSW advised that RNCM would assist and discuss that with patient if he desires to appeal if he is not approved.  CSW will continue to follow.  Elissa Hefty, LCSW Clinical Social Worker [phone]

## 2017-11-14 NOTE — Progress Notes (Signed)
Removed IV, all questions and concerns addressed, Pt not in distress, called facility and gave report to nurse Amrah, discharged with all belongings via [facility].

## 2017-11-19 ENCOUNTER — Other Ambulatory Visit: Payer: Self-pay | Admitting: *Deleted

## 2017-11-19 DIAGNOSIS — Z96651 Presence of right artificial knee joint: Secondary | ICD-10-CM | POA: Diagnosis not present

## 2017-11-19 DIAGNOSIS — I255 Ischemic cardiomyopathy: Secondary | ICD-10-CM | POA: Diagnosis not present

## 2017-11-19 DIAGNOSIS — I82401 Acute embolism and thrombosis of unspecified deep veins of right lower extremity: Secondary | ICD-10-CM | POA: Diagnosis not present

## 2017-11-19 DIAGNOSIS — K5909 Other constipation: Secondary | ICD-10-CM | POA: Diagnosis not present

## 2017-11-19 DIAGNOSIS — R55 Syncope and collapse: Secondary | ICD-10-CM | POA: Diagnosis not present

## 2017-11-19 NOTE — Patient Outreach (Addendum)
[facility]) Care Management  11/19/2017  [name] [birth year] [id number]    CSW met with patient at [facility] today. I was able to introduce self in person and [facility] program.  CSW provided patient with [facility] packet and consent was signed.  Patient plans to dc home from SNF on Saturday- his brother will provide transportation and does plan to go back home alone. He feels he is making positive strides with therapy and is comfortable with plans for dc home Saturday.    CSW plans to follow while at SNF and plan follow up for transportation ([facility]), Advance Directives, etc.    Patient appreciative of CSW support in coordinating the PCP and Cardiology visits that had to be done prior to having his surgery. He now is optimistic post operatively and after SNF to be able to have less pain and focus on his overall well-being better.   [facility] CM Care Plan Problem One     Most Recent Value  Care Plan Problem One  Patient's hip surgery cancelled due to not seeing PCP and/or Cardiology pre-op  Role Documenting the Problem One  Clinical Social Worker  Care Plan for Problem One  Active  [facility] Long Term Goal   Patient will be scheduled for hip surgery in the next 90 days  [facility] Long Term Goal Start Date  09/29/17  [facility] Long Term Goal Met Date  11/19/17  Interventions for Problem One Long Term Goal  CSW discussed the need for pateint to be complete pre-op appointments for surgery to be rescheduled.  [facility] CM Short Term Goal #1   Patient will report communication and appointment wtih PCP in the next 7 days.   [facility] CM Short Term Goal #1 Start Date  09/29/17  [facility] CM Short Term Goal #1 Met Date  10/08/17  Interventions for Short Term Goal #1  CSW advised and provided patient with # for PCP visit pre-operatively.  [facility] CM Short Term Goal #2   Patient will secure and attend PCP and Cardiology appointments in the next 30 days.   [facility] CM Short Term Goal #2 Start Date  09/29/17  [facility] CM Short Term Goal  #2 Met Date  10/22/17  Interventions for Short Term Goal #2  CSW advised and provided infomrmation for appoiintments to be secured and will f/u to confirm.     [facility] CM Care Plan Problem Two     Most Recent Value  Care Plan Problem Two  Patient in SNF rehab s/p THR.  Role Documenting the Problem Two  Clinical Social Worker  Care Plan for Problem Two  Active  [facility] CM Short Term Goal #1   Patient and SNF will report progress with therapy while at SNF in the next 30 days.   [facility] CM Short Term Goal #1 Start Date  11/17/17  Interventions for Short Term Goal #2   CSW encouraged and offered SNF benefits/coverage info to patient.   [facility] CM Short Term Goal #2   Patient will have adequate support and services at home prior to SNF dc in the next 30 days.   [facility] CM Short Term Goal #2 Start Date  11/17/17  Interventions for Short Term Goal #2  CSW completing assessment and coordinating services, support and  resources for home.         [name], MSW, [place] Worker  [place] [phone]

## 2017-11-21 ENCOUNTER — Other Ambulatory Visit: Payer: Self-pay | Admitting: *Deleted

## 2017-11-21 NOTE — Patient Outreach (Signed)
  [facility]) Care Management  [facility]  11/21/2017  Shawn MooresStevie E Smith [birth year] [id number]  Subjective:  "I am going home tomorrow"  Objective: [facility] CSW to assist patient and family with community based resources to aide in their well-being, quality of life and overall safety and needs.    Encounter Medications:  Outpatient Encounter Medications as of 11/21/2017  Medication Sig  . aspirin EC 325 MG tablet Take 1 tablet (325 mg total) by mouth 2 (two) times daily.  Marland Kitchen. HYDROcodone-acetaminophen (NORCO/VICODIN) 5-325 MG tablet Take 1-2 tablets by mouth every 6 hours as needed for pain and/or cough. (Patient not taking: Reported on 10/29/2017)  . ondansetron (ZOFRAN) 4 MG tablet Take 1-2 tablets (4-8 mg total) by mouth every 8 (eight) hours as needed for nausea or vomiting.  Marland Kitchen. oxyCODONE (OXYCONTIN) 10 mg 12 hr tablet Take 1 tablet (10 mg total) by mouth every 12 (twelve) hours.  Marland Kitchen. oxyCODONE-acetaminophen (PERCOCET) 5-325 MG tablet Take 1-2 tablets by mouth every 4 (four) hours as needed for severe pain.  . promethazine (PHENERGAN) 25 MG tablet Take 1 tablet (25 mg total) by mouth every 6 (six) hours as needed for nausea.  Marland Kitchen. senna-docusate (SENOKOT S) 8.6-50 MG tablet Take 1 tablet by mouth at bedtime as needed.  Marland Kitchen. tiZANidine (ZANAFLEX) 4 MG tablet Take 1 tablet (4 mg total) by mouth every 6 (six) hours as needed for muscle spasms.  . traMADol (ULTRAM) 50 MG tablet Take 1-2 tablets (50-100 mg total) by mouth 3 (three) times daily as needed. (Patient not taking: Reported on 10/29/2017)   No facility-administered encounter medications on file as of 11/21/2017.     Functional Status:  In your present state of health, do you have any difficulty performing the following activities: 11/12/2017 11/05/2017  Hearing? N N  Vision? N N  Difficulty concentrating or making decisions? N Y  Walking or climbing stairs? Y Y  Dressing or bathing? N Y  Doing errands, shopping? N N   Some recent data might be hidden    Fall/Depression Screening:  PHQ 2/9 Scores 10/11/2017 10/08/2017 09/12/2017 05/07/2013  PHQ - 2 Score 0 0 3 0  PHQ- 9 Score - - 10 -    Assessment:  CSW spoke with SNF rep who reports plans are in place for dc to home on 11/22/17.  [facility] arranged for home health PT and his brother will transport him home.  CSW will plan f/u call on Monday for update post SNF dc.   Shawn LevyJanet Devonta Smith, MSW, LCSW Clinical Social Worker  [facility] [phone]

## 2017-11-23 DIAGNOSIS — Z79891 Long term (current) use of opiate analgesic: Secondary | ICD-10-CM | POA: Diagnosis not present

## 2017-11-23 DIAGNOSIS — I10 Essential (primary) hypertension: Secondary | ICD-10-CM | POA: Diagnosis not present

## 2017-11-23 DIAGNOSIS — Z9181 History of falling: Secondary | ICD-10-CM | POA: Diagnosis not present

## 2017-11-23 DIAGNOSIS — Z7982 Long term (current) use of aspirin: Secondary | ICD-10-CM | POA: Diagnosis not present

## 2017-11-23 DIAGNOSIS — I429 Cardiomyopathy, unspecified: Secondary | ICD-10-CM | POA: Diagnosis not present

## 2017-11-23 DIAGNOSIS — Z471 Aftercare following joint replacement surgery: Secondary | ICD-10-CM | POA: Diagnosis not present

## 2017-11-23 DIAGNOSIS — Z96643 Presence of artificial hip joint, bilateral: Secondary | ICD-10-CM | POA: Diagnosis not present

## 2017-11-24 ENCOUNTER — Telehealth (INDEPENDENT_AMBULATORY_CARE_PROVIDER_SITE_OTHER): Payer: Self-pay | Admitting: Orthopaedic Surgery

## 2017-11-24 ENCOUNTER — Other Ambulatory Visit (INDEPENDENT_AMBULATORY_CARE_PROVIDER_SITE_OTHER): Payer: Self-pay

## 2017-11-24 ENCOUNTER — Other Ambulatory Visit: Payer: Self-pay | Admitting: *Deleted

## 2017-11-24 MED ORDER — OXYCODONE-ACETAMINOPHEN 5-325 MG PO TABS: ORAL_TABLET | ORAL | 0 refills | Status: DC

## 2017-11-24 NOTE — Telephone Encounter (Signed)
[name] with [facility] needs PT verbal orders for patient ° °3x for 2 weeks ° °CB #[phone] °

## 2017-11-24 NOTE — Telephone Encounter (Signed)
Please advise if so, how many and instructions?  

## 2017-11-24 NOTE — Telephone Encounter (Signed)
Called Maria back no answer LMOM okay on orders per Dr Roda ShuttersXu.

## 2017-11-24 NOTE — Telephone Encounter (Signed)
Is this okay?

## 2017-11-24 NOTE — Telephone Encounter (Signed)
Patient called needing Rx refilled (Oxycodone and Oxycontin) Patient said he left the nursing home Saturday and was given 10 tabs of the Oxycodone. The number to contact patient is [phone]

## 2017-11-24 NOTE — Telephone Encounter (Signed)
Rx pending signature.  

## 2017-11-24 NOTE — Patient Outreach (Signed)
[name], [name] Medications as of 11/24/2017  Medication Sig  . aspirin EC 325 MG tablet Take 1 tablet (325 mg total) by mouth 2 (two) times daily.  Marland Kitchen HYDROcodone-acetaminophen (NORCO/VICODIN) 5-325 MG tablet Take 1-2 tablets by mouth every 6 hours as needed for pain and/or cough. (Patient not taking: Reported on 10/29/2017)  . ondansetron (ZOFRAN) 4 MG tablet Take 1-2 tablets (4-8 mg total) by mouth every 8 (eight) hours as needed for nausea or vomiting.  Marland Kitchen oxyCODONE (OXYCONTIN) 10 mg 12 hr tablet Take 1 tablet (10 mg total) by mouth every 12 (twelve) hours.  Marland Kitchen oxyCODONE-acetaminophen (PERCOCET) 5-325 MG tablet Take 1-2 tablets by mouth every 4 (four) hours as needed for severe pain.  . promethazine (PHENERGAN) 25 MG tablet Take 1 tablet (25 mg total) by mouth every 6 (six) hours as needed for nausea.  Marland Kitchen senna-docusate (SENOKOT S) 8.6-50 MG tablet Take 1 tablet by mouth at bedtime as needed.  Marland Kitchen tiZANidine (ZANAFLEX) 4 MG tablet Take 1 tablet (4 mg total) by mouth every 6 (six) hours as needed for muscle spasms.  . traMADol (ULTRAM) 50 MG tablet Take 1-2 tablets (50-100 mg total) by mouth 3 (three) times daily as needed. (Patient not taking: Reported on 10/29/2017)   No facility-administered Smith medications on file as of 11/24/2017.     Functional Status:  In your present state of health, do you have any difficulty performing the following activities: 11/12/2017 11/05/2017  Hearing? N N  Vision? N N  Difficulty concentrating or making decisions? N Y  Walking or climbing stairs? Y Y  Dressing or bathing? N Y  Doing  errands, shopping? N N  Some recent data might be hidden    Fall/Depression Screening:  PHQ 2/9 Scores 10/11/2017 10/08/2017 09/12/2017 05/07/2013  PHQ - 2 Score 0 0 3 0  PHQ- 9 Score - - 10 -    Assessment:   CSW spoke with patient by phone who reports he is doing well at home. He expects HH PT to come 3 times a week and has already seen [facility]  He denies any concerns or needs aside from running out of pain meds. He reports the SNF sent him home with 10 pills and he has run out but is going to get RX from Ortho MD today.  He also plans to see Ortho MD on Thursday for a post op and post SNF visit.  He may decide to do outpatient therapies per his conversation with HHPT- "you can do more at the outpatient".  CSW will advise [facility] team of patient's release from SNF and plan f/u call later this week.  Patient denies concerns or needs at this time and is encouraged to call CSW if needs do arise. CSW assisting with SCAT application completion and will submit for SCAT determination.   Plan:    [facility] CM Care Plan Problem One     Most Recent Value  Care Plan Problem One  Patient's hip surgery cancelled due to not seeing PCP and/or Cardiology pre-op  Role Documenting the Problem One  Clinical Social Worker  Care Plan for  Problem One  Active  [facility] Long Term Goal   Patient will be scheduled for hip surgery in the next 90 days  [facility] Long Term Goal Start Date  09/29/17  [facility] Long Term Goal Met Date  11/19/17  Interventions for Problem One Long Term Goal  CSW discussed the need for pateint to be complete pre-op appointments for surgery to be rescheduled.  [facility] CM Short Term Goal #1   Patient will report communication and appointment wtih PCP in the next 7 days.   [facility] CM Short Term Goal #1 Start Date  09/29/17  [facility] CM Short Term Goal #1 Met Date  10/08/17  Interventions for Short Term Goal #1  CSW advised and provided patient with # for PCP visit pre-operatively.  [facility] CM Short Term Goal #2    Patient will secure and attend PCP and Cardiology appointments in the next 30 days.   [facility] CM Short Term Goal #2 Start Date  09/29/17  [facility] CM Short Term Goal #2 Met Date  10/22/17  Interventions for Short Term Goal #2  CSW advised and provided infomrmation for appoiintments to be secured and will f/u to confirm.     [facility] CM Care Plan Problem Two     Most Recent Value  Care Plan Problem Two  Patient in SNF rehab s/p THR.  Role Documenting the Problem Two  Clinical Social Worker  Care Plan for Problem Two  Active  [facility] CM Short Term Goal #1   Patient and SNF will report progress with therapy while at SNF in the next 30 days.   [facility] CM Short Term Goal #1 Start Date  11/17/17  [facility] CM Short Term Goal #1 Met Date   11/24/17  Interventions for Short Term Goal #2   CSW encouraged and offered SNF benefits/coverage info to patient.   [facility] CM Short Term Goal #2   Patient will have adequate support and services at home prior to SNF dc in the next 30 days.   [facility] CM Short Term Goal #2 Start Date  11/17/17  Interventions for Short Term Goal #2  CSW completing assessment and coordinating services, support and  resources for home.     [facility] CM Care Plan Problem Three     Most Recent Value  Care Plan Problem Three  Patient with limited transportation support.  Role Documenting the Problem Three  Clinical Social Worker  Care Plan for Problem Three  Active  [facility] CM Short Term Goal #1   Patient will apply for SCAT transportation services in the next 30 days.  [facility] CM Short Term Goal #1 Start Date  11/24/17  Interventions for Short Term Goal #1  CSW provided and assisting with SCAT application .   [facility] CM Short Term Goal #2   Patient will be connected with alternate transportation support in the next 30days.   [facility] CM Short Term Goal #2 Start Date  11/24/17  Interventions for Short Term Goal #2  CSW assisting with linking paitent with transportation support via insurance and community based services.       [name], MSW, [place] Worker  [place] [phone]

## 2017-11-24 NOTE — Telephone Encounter (Signed)
See other message

## 2017-11-24 NOTE — Telephone Encounter (Signed)
Patient requests RX refills on oxycodone and percocet. He said he has one left. He would like a call back when possible. CB # [phone]

## 2017-11-24 NOTE — Telephone Encounter (Signed)
yes

## 2017-11-24 NOTE — Telephone Encounter (Signed)
Oxycodone only.  #30.  1-2 tabs po bid prn pain.

## 2017-11-26 DIAGNOSIS — Z96643 Presence of artificial hip joint, bilateral: Secondary | ICD-10-CM | POA: Diagnosis not present

## 2017-11-26 DIAGNOSIS — I429 Cardiomyopathy, unspecified: Secondary | ICD-10-CM | POA: Diagnosis not present

## 2017-11-26 DIAGNOSIS — I10 Essential (primary) hypertension: Secondary | ICD-10-CM | POA: Diagnosis not present

## 2017-11-26 DIAGNOSIS — Z9181 History of falling: Secondary | ICD-10-CM | POA: Diagnosis not present

## 2017-11-26 DIAGNOSIS — Z7982 Long term (current) use of aspirin: Secondary | ICD-10-CM | POA: Diagnosis not present

## 2017-11-26 DIAGNOSIS — Z471 Aftercare following joint replacement surgery: Secondary | ICD-10-CM | POA: Diagnosis not present

## 2017-11-26 DIAGNOSIS — Z79891 Long term (current) use of opiate analgesic: Secondary | ICD-10-CM | POA: Diagnosis not present

## 2017-11-26 NOTE — Telephone Encounter (Signed)
Called patient no answer LMOM advised him Rx is ready for pick up.

## 2017-11-27 ENCOUNTER — Encounter (INDEPENDENT_AMBULATORY_CARE_PROVIDER_SITE_OTHER): Payer: Self-pay | Admitting: Orthopaedic Surgery

## 2017-11-27 ENCOUNTER — Ambulatory Visit (INDEPENDENT_AMBULATORY_CARE_PROVIDER_SITE_OTHER): Payer: Medicare Other | Admitting: Orthopaedic Surgery

## 2017-11-27 DIAGNOSIS — M1611 Unilateral primary osteoarthritis, right hip: Secondary | ICD-10-CM

## 2017-11-27 NOTE — Progress Notes (Signed)
Patient ID: [name], male   DOB: 01-26-1961, 57 y.o.   MRN: [id number]  Patient is two-week status post left total hip replacement.  He is coming along he is now living at home doing home physical therapy.  His surgical heart is healed.  There is no drainage or signs of infection.  Leg lengths are equal.  He is ambulating with a cane.  Steri-Strips were removed today.  Continue with home health physical therapy until he has met his goals.  Pain medicine prescribed today.  Follow-up in 4 weeks for recheck and AP standing pelvis on return.

## 2017-11-28 ENCOUNTER — Other Ambulatory Visit: Payer: Self-pay | Admitting: *Deleted

## 2017-11-28 DIAGNOSIS — Z471 Aftercare following joint replacement surgery: Secondary | ICD-10-CM | POA: Diagnosis not present

## 2017-11-28 DIAGNOSIS — I10 Essential (primary) hypertension: Secondary | ICD-10-CM | POA: Diagnosis not present

## 2017-11-28 DIAGNOSIS — Z79891 Long term (current) use of opiate analgesic: Secondary | ICD-10-CM | POA: Diagnosis not present

## 2017-11-28 DIAGNOSIS — Z96643 Presence of artificial hip joint, bilateral: Secondary | ICD-10-CM | POA: Diagnosis not present

## 2017-11-28 DIAGNOSIS — Z9181 History of falling: Secondary | ICD-10-CM | POA: Diagnosis not present

## 2017-11-28 DIAGNOSIS — I429 Cardiomyopathy, unspecified: Secondary | ICD-10-CM | POA: Diagnosis not present

## 2017-11-28 DIAGNOSIS — Z7982 Long term (current) use of aspirin: Secondary | ICD-10-CM | POA: Diagnosis not present

## 2017-11-28 NOTE — Patient Outreach (Signed)
[facility]) Care Management  [facility] Social Work  11/28/2017  JAMEEL QUANT [birth year] [id number]  Subjective:  "I'm walking with no cane"  Objective: [facility] CSW to assist patient and family with community based resources to aide in their well-being, quality of life and overall safety and needs.    Current Medications:  Current Outpatient Medications  Medication Sig Dispense Refill  . aspirin EC 325 MG tablet Take 1 tablet (325 mg total) by mouth 2 (two) times daily. 84 tablet 0  . HYDROcodone-acetaminophen (NORCO/VICODIN) 5-325 MG tablet Take 1-2 tablets by mouth every 6 hours as needed for pain and/or cough. (Patient not taking: Reported on 11/27/2017) 11 tablet 0  . ondansetron (ZOFRAN) 4 MG tablet Take 1-2 tablets (4-8 mg total) by mouth every 8 (eight) hours as needed for nausea or vomiting. (Patient not taking: Reported on 11/27/2017) 40 tablet 0  . oxyCODONE (OXYCONTIN) 10 mg 12 hr tablet Take 1 tablet (10 mg total) by mouth every 12 (twelve) hours. (Patient not taking: Reported on 11/27/2017) 10 tablet 0  . oxyCODONE-acetaminophen (PERCOCET) 5-325 MG tablet 1-2 tabs po bid prn pain. (Patient not taking: Reported on 11/27/2017) 30 tablet 0  . promethazine (PHENERGAN) 25 MG tablet Take 1 tablet (25 mg total) by mouth every 6 (six) hours as needed for nausea. 30 tablet 1  . senna-docusate (SENOKOT S) 8.6-50 MG tablet Take 1 tablet by mouth at bedtime as needed. 30 tablet 1  . tiZANidine (ZANAFLEX) 4 MG tablet Take 1 tablet (4 mg total) by mouth every 6 (six) hours as needed for muscle spasms. 30 tablet 2  . traMADol (ULTRAM) 50 MG tablet Take 1-2 tablets (50-100 mg total) by mouth 3 (three) times daily as needed. (Patient not taking: Reported on 11/27/2017) 30 tablet 2   No current facility-administered medications for this visit.     Functional Status:  In your present state of health, do you have any difficulty performing the following activities: 11/12/2017 11/05/2017   Hearing? N N  Vision? N N  Difficulty concentrating or making decisions? N Y  Walking or climbing stairs? Y Y  Dressing or bathing? N Y  Doing errands, shopping? N N  Some recent data might be hidden    Fall/Depression Screening:  Fall Risk  10/08/2017 05/07/2013  Falls in the past year? Yes No  Number falls in past yr: 1 -  Injury with Fall? No -   PHQ 2/9 Scores 10/11/2017 10/08/2017 09/12/2017 05/07/2013  PHQ - 2 Score 0 0 3 0  PHQ- 9 Score - - 10 -    Assessment:  CSW contacted patient for post SNF follow up today. He reports he went to see the Orthopedic Surgeon yesterday and all is good.  He is receiving HHPT and progressing well.  He denies any concerns or needs at this time. We are awaiting word from SCAT and he has family assisting with rides in the mean time.  Patient agrees to phone f/u in 3-4 weeks for follow up on SCAT application and to assess for further needs.    Plan:  [facility] CM Care Plan Problem One     Most Recent Value  Care Plan Problem One  Patient's hip surgery cancelled due to not seeing PCP and/or Cardiology pre-op  Role Documenting the Problem One  Clinical Social Worker  Care Plan for Problem One  Active  [facility] Long Term Goal   Patient will be scheduled for hip surgery in the next 90 days  [facility] Long Term Goal Start Date  09/29/17  [facility] Long Term Goal Met Date  11/19/17  Interventions for Problem One Long Term Goal  CSW discussed the need for pateint to be complete pre-op appointments for surgery to be rescheduled.  [facility] CM Short Term Goal #1   Patient will report communication and appointment wtih PCP in the next 7 days.   [facility] CM Short Term Goal #1 Start Date  09/29/17  [facility] CM Short Term Goal #1 Met Date  10/08/17  Interventions for Short Term Goal #1  CSW advised and provided patient with # for PCP visit pre-operatively.  [facility] CM Short Term Goal #2   Patient will secure and attend PCP and Cardiology appointments in the next 30 days.   [facility] CM Short Term Goal #2  Start Date  09/29/17  [facility] CM Short Term Goal #2 Met Date  10/22/17  Interventions for Short Term Goal #2  CSW advised and provided infomrmation for appoiintments to be secured and will f/u to confirm.     [facility] CM Care Plan Problem Two     Most Recent Value  Care Plan Problem Two  Patient in SNF rehab s/p THR.  Role Documenting the Problem Two  Clinical Social Worker  Care Plan for Problem Two  Active  [facility] CM Short Term Goal #1   Patient and SNF will report progress with therapy while at SNF in the next 30 days.   [facility] CM Short Term Goal #1 Start Date  11/17/17  [facility] CM Short Term Goal #1 Met Date   11/24/17  Interventions for Short Term Goal #2   CSW encouraged and offered SNF benefits/coverage info to patient.   [facility] CM Short Term Goal #2   Patient will have adequate support and services at home prior to SNF dc in the next 30 days.   [facility] CM Short Term Goal #2 Start Date  11/17/17  Interventions for Short Term Goal #2  CSW completing assessment and coordinating services, support and  resources for home.     [facility] CM Care Plan Problem Three     Most Recent Value  Care Plan Problem Three  Patient with limited transportation support.  Role Documenting the Problem Three  Clinical Social Worker  Care Plan for Problem Three  Active  [facility] CM Short Term Goal #1   Patient will apply for SCAT transportation services in the next 30 days.  [facility] CM Short Term Goal #1 Start Date  11/24/17  Interventions for Short Term Goal #1  CSW provided and assisting with SCAT application .   [facility] CM Short Term Goal #2   Patient will be connected with alternate transportation support in the next 30days.   [facility] CM Short Term Goal #2 Start Date  11/24/17  Interventions for Short Term Goal #2  CSW assisting with linking paitent with transportation support via insurance and community based services.       [name], MSW, [place] Worker  [place] [phone]

## 2017-12-01 DIAGNOSIS — Z7982 Long term (current) use of aspirin: Secondary | ICD-10-CM | POA: Diagnosis not present

## 2017-12-01 DIAGNOSIS — I10 Essential (primary) hypertension: Secondary | ICD-10-CM | POA: Diagnosis not present

## 2017-12-01 DIAGNOSIS — Z96643 Presence of artificial hip joint, bilateral: Secondary | ICD-10-CM | POA: Diagnosis not present

## 2017-12-01 DIAGNOSIS — Z9181 History of falling: Secondary | ICD-10-CM | POA: Diagnosis not present

## 2017-12-01 DIAGNOSIS — Z79891 Long term (current) use of opiate analgesic: Secondary | ICD-10-CM | POA: Diagnosis not present

## 2017-12-01 DIAGNOSIS — I429 Cardiomyopathy, unspecified: Secondary | ICD-10-CM | POA: Diagnosis not present

## 2017-12-01 DIAGNOSIS — Z471 Aftercare following joint replacement surgery: Secondary | ICD-10-CM | POA: Diagnosis not present

## 2017-12-03 ENCOUNTER — Encounter: Payer: Self-pay | Admitting: Internal Medicine

## 2017-12-03 ENCOUNTER — Ambulatory Visit (INDEPENDENT_AMBULATORY_CARE_PROVIDER_SITE_OTHER): Payer: Medicare Other | Admitting: Internal Medicine

## 2017-12-03 VITALS — BP 142/86 | HR 112 | Temp 98.5°F | Ht 68.0 in | Wt 215.0 lb

## 2017-12-03 DIAGNOSIS — F411 Generalized anxiety disorder: Secondary | ICD-10-CM | POA: Diagnosis not present

## 2017-12-03 DIAGNOSIS — Z7982 Long term (current) use of aspirin: Secondary | ICD-10-CM | POA: Diagnosis not present

## 2017-12-03 DIAGNOSIS — I429 Cardiomyopathy, unspecified: Secondary | ICD-10-CM | POA: Diagnosis not present

## 2017-12-03 DIAGNOSIS — I10 Essential (primary) hypertension: Secondary | ICD-10-CM

## 2017-12-03 DIAGNOSIS — M25551 Pain in right hip: Secondary | ICD-10-CM

## 2017-12-03 DIAGNOSIS — Z9181 History of falling: Secondary | ICD-10-CM | POA: Diagnosis not present

## 2017-12-03 DIAGNOSIS — Z79891 Long term (current) use of opiate analgesic: Secondary | ICD-10-CM | POA: Diagnosis not present

## 2017-12-03 DIAGNOSIS — Z96643 Presence of artificial hip joint, bilateral: Secondary | ICD-10-CM | POA: Diagnosis not present

## 2017-12-03 DIAGNOSIS — Z471 Aftercare following joint replacement surgery: Secondary | ICD-10-CM | POA: Diagnosis not present

## 2017-12-03 MED ORDER — OXYCODONE-ACETAMINOPHEN 7.5-325 MG PO TABS: 1.0000 | ORAL_TABLET | ORAL | 0 refills | Status: DC | PRN

## 2017-12-03 NOTE — Patient Instructions (Signed)
Please take all new medication as prescribed - the percocet 7.5's  Please continue all other medications as before, and refills have been done if requested.  Please have the pharmacy call with any other refills you may need.  Please keep your appointments with your specialists as you may have planned

## 2017-12-03 NOTE — Progress Notes (Signed)
Subjective:    Patient ID: [name], male    DOB: 01/30/1961, 57 y.o.   MRN: [id number]  HPI   Here to f/u s/p right THR about 2 wks ago, had post op narcotic but c/o persistent pain since post op home PT after rehab stay s/p right THR. States thinks PT made his pain worse. Due for f/u ortho in 3 wks, cannot seem to get contact for med refill.  Pt denies chest pain, increased sob or doe, wheezing, orthopnea, PND, increased LE swelling, palpitations, dizziness or syncope.   Pt denies polydipsia, polyuria. Pt denies new neurological symptoms such as new headache, or facial or extremity weakness or numbness   Denies worsening depressive symptoms, suicidal ideation, or panic; has ongoing anxiety Past Medical History:  Diagnosis Date  . Alcohol abuse    in the past  . Anxiety   . Cardiomyopathy (HCC)    hx of   Past Surgical History:  Procedure Laterality Date  . APPENDECTOMY    . HERNIA REPAIR    . TOTAL HIP ARTHROPLASTY Left 10/15/2013   Procedure: LEFT TOTAL HIP ARTHROPLASTY;  Surgeon: Thera Flake., MD;  Location: [facility] OR;  Service: Orthopedics;  Laterality: Left;  . TOTAL HIP ARTHROPLASTY Right 11/12/2017   Procedure: RIGHT TOTAL HIP ARTHROPLASTY ANTERIOR APPROACH;  Surgeon: Tarry Kos, MD;  Location: [facility] OR;  Service: Orthopedics;  Laterality: Right;    reports that he has been smoking cigarettes.  He has a 1.00 pack-year smoking history. he has never used smokeless tobacco. He reports that he drinks alcohol. He reports that he does not use drugs. family history includes Colon cancer in his brother; Diabetes in his paternal uncle; Heart failure in his brother; Liver cancer in his brother. No Known Allergies Current Outpatient Medications on File Prior to Visit  Medication Sig Dispense Refill  . aspirin EC 325 MG tablet Take 1 tablet (325 mg total) by mouth 2 (two) times daily. 84 tablet 0  . HYDROcodone-acetaminophen (NORCO/VICODIN) 5-325 MG tablet Take 1-2 tablets by mouth  every 6 hours as needed for pain and/or cough. 11 tablet 0  . ondansetron (ZOFRAN) 4 MG tablet Take 1-2 tablets (4-8 mg total) by mouth every 8 (eight) hours as needed for nausea or vomiting. 40 tablet 0  . oxyCODONE (OXYCONTIN) 10 mg 12 hr tablet Take 1 tablet (10 mg total) by mouth every 12 (twelve) hours. 10 tablet 0  . promethazine (PHENERGAN) 25 MG tablet Take 1 tablet (25 mg total) by mouth every 6 (six) hours as needed for nausea. 30 tablet 1  . senna-docusate (SENOKOT S) 8.6-50 MG tablet Take 1 tablet by mouth at bedtime as needed. 30 tablet 1  . tiZANidine (ZANAFLEX) 4 MG tablet Take 1 tablet (4 mg total) by mouth every 6 (six) hours as needed for muscle spasms. 30 tablet 2  . traMADol (ULTRAM) 50 MG tablet Take 1-2 tablets (50-100 mg total) by mouth 3 (three) times daily as needed. 30 tablet 2   No current facility-administered medications on file prior to visit.    Review of Systems  Constitutional: Negative for other unusual diaphoresis or sweats HENT: Negative for ear discharge or swelling Eyes: Negative for other worsening visual disturbances Respiratory: Negative for stridor or other swelling  Gastrointestinal: Negative for worsening distension or other blood Genitourinary: Negative for retention or other urinary change Musculoskeletal: Negative for other MSK pain or swelling Skin: Negative for color change or other new lesions Neurological: Negative for  worsening tremors and other numbness  Psychiatric/Behavioral: Negative for worsening agitation or other fatigue All other system neg per pt    Objective:   Physical Exam BP (!) 142/86   Pulse (!) 112   Temp 98.5 F (36.9 C) (Oral)   Ht 5\' 8"  (1.727 m)   Wt 215 lb (97.5 kg)   SpO2 98%   BMI 32.69 kg/m  VS noted,  Constitutional: Pt appears in NAD HENT: Head: NCAT.  Right Ear: External ear normal.  Left Ear: External ear normal.  Eyes: . Pupils are equal, round, and reactive to light. Conjunctivae and EOM are  normal Nose: without d/c or deformity Neck: Neck supple. Gross normal ROM Cardiovascular: Normal rate and regular rhythm.   Pulmonary/Chest: Effort normal and breath sounds without rales or wheezing.  Abd:  Soft, NT, ND, + BS, no organomegaly Right lateral hip with intact incision without erythema or dehiscence Neurological: Pt is alert. At baseline orientation, motor grossly intact Skin: Skin is warm. No rashes, other new lesions, no LE edema Psychiatric: Pt behavior is normal without agitation , mild nervous No other exam findings    Assessment & Plan:

## 2017-12-04 DIAGNOSIS — M25551 Pain in right hip: Secondary | ICD-10-CM | POA: Insufficient documentation

## 2017-12-04 NOTE — Assessment & Plan Note (Signed)
Mild elevated, likely situational, o/w stable overall by history and exam, recent data reviewed with pt, and pt to continue medical treatment as before,  to f/u any worsening symptoms or concerns BP Readings from Last 3 Encounters:  12/03/17 (!) 142/86  11/14/17 106/60  11/05/17 123/88

## 2017-12-04 NOTE — Assessment & Plan Note (Signed)
stable overall by history and exam, and pt to continue medical treatment as before,  to f/u any worsening symptoms or concerns 

## 2017-12-04 NOTE — Assessment & Plan Note (Signed)
S/p recent THR, ok for med refill for limited postop pain control, f/u ortho in 3 wks

## 2017-12-05 DIAGNOSIS — Z79891 Long term (current) use of opiate analgesic: Secondary | ICD-10-CM | POA: Diagnosis not present

## 2017-12-05 DIAGNOSIS — Z96643 Presence of artificial hip joint, bilateral: Secondary | ICD-10-CM | POA: Diagnosis not present

## 2017-12-05 DIAGNOSIS — Z7982 Long term (current) use of aspirin: Secondary | ICD-10-CM | POA: Diagnosis not present

## 2017-12-05 DIAGNOSIS — I429 Cardiomyopathy, unspecified: Secondary | ICD-10-CM | POA: Diagnosis not present

## 2017-12-05 DIAGNOSIS — I10 Essential (primary) hypertension: Secondary | ICD-10-CM | POA: Diagnosis not present

## 2017-12-05 DIAGNOSIS — Z471 Aftercare following joint replacement surgery: Secondary | ICD-10-CM | POA: Diagnosis not present

## 2017-12-05 DIAGNOSIS — Z9181 History of falling: Secondary | ICD-10-CM | POA: Diagnosis not present

## 2017-12-19 ENCOUNTER — Other Ambulatory Visit: Payer: Self-pay | Admitting: *Deleted

## 2017-12-19 NOTE — Patient Outreach (Signed)
[facility]) Care Management  [facility] Social Work  12/19/2017  ALVEN ALVERIO 04/10/1961 [id number]  Subjective:  "I hadn't been using the cane"  Objective:  [facility]N CSW to assist patient and family with community based resources to aide in their well-being, quality of life and overall safety and needs.    Encounter Medications:  Outpatient Encounter Medications as of 12/19/2017  Medication Sig  . aspirin EC 325 MG tablet Take 1 tablet (325 mg total) by mouth 2 (two) times daily.  Marland Kitchen HYDROcodone-acetaminophen (NORCO/VICODIN) 5-325 MG tablet Take 1-2 tablets by mouth every 6 hours as needed for pain and/or cough.  . ondansetron (ZOFRAN) 4 MG tablet Take 1-2 tablets (4-8 mg total) by mouth every 8 (eight) hours as needed for nausea or vomiting.  Marland Kitchen oxyCODONE (OXYCONTIN) 10 mg 12 hr tablet Take 1 tablet (10 mg total) by mouth every 12 (twelve) hours.  Marland Kitchen oxyCODONE-acetaminophen (PERCOCET) 7.5-325 MG tablet Take 1 tablet by mouth every 4 (four) hours as needed for severe pain.  . promethazine (PHENERGAN) 25 MG tablet Take 1 tablet (25 mg total) by mouth every 6 (six) hours as needed for nausea.  Marland Kitchen senna-docusate (SENOKOT S) 8.6-50 MG tablet Take 1 tablet by mouth at bedtime as needed.  Marland Kitchen tiZANidine (ZANAFLEX) 4 MG tablet Take 1 tablet (4 mg total) by mouth every 6 (six) hours as needed for muscle spasms.  . traMADol (ULTRAM) 50 MG tablet Take 1-2 tablets (50-100 mg total) by mouth 3 (three) times daily as needed.   No facility-administered encounter medications on file as of 12/19/2017.     Functional Status:  In your present state of health, do you have any difficulty performing the following activities: 11/12/2017 11/05/2017  Hearing? N N  Vision? N N  Difficulty concentrating or making decisions? N Y  Walking or climbing stairs? Y Y  Dressing or bathing? N Y  Doing errands, shopping? N N  Some recent data might be hidden    Fall/Depression Screening:   Assessmen: CSW  spoke with patient who reports he has been progressing, getting stronger and pleased post op with his progress. "It's only been about 5 weeks".  He indicates the only RX he was taking was for pain and he has stopped taking them bec od "constipation". He denies any other RX regimen and indicated he has no concerns or needs at this time.  He has heard from [facility] and he told them he did not think he needed them- CSW encouraged him to contact them if needs do arise and he is hopeful to be driving again soon.  CSW discussed goals met and plans to close case- he agrees to this and CSW will advise [facility] team and PCP.      Plan:   [facility] CM Care Plan Problem One     Most Recent Value  Care Plan Problem One  Patient's hip surgery cancelled due to not seeing PCP and/or Cardiology pre-op  Role Documenting the Problem One  Clinical Social Worker  Care Plan for Problem One  Active  [facility] Long Term Goal   Patient will be scheduled for hip surgery in the next 90 days  [facility] Long Term Goal Start Date  09/29/17  [facility] Long Term Goal Met Date  11/19/17  Interventions for Problem One Long Term Goal  CSW discussed the need for pateint to be complete pre-op appointments for surgery to be rescheduled.  [facility]N CM Short Term Goal #1   Patient will report communication  and appointment wtih PCP in the next 7 days.   THN CM Short Term Goal #1 Start Date  09/29/17  [facility] CM Short Term Goal #1 Met Date  10/08/17  Interventions for Short Term Goal #1  CSW advised and provided patient with # for PCP visit pre-operatively.  THN CM Short Term Goal #2   Patient will secure and attend PCP and Cardiology appointments in the next 30 days.   [facility]N CM Short Term Goal #2 Start Date  09/29/17  [facility] CM Short Term Goal #2 Met Date  10/22/17  Interventions for Short Term Goal #2  CSW advised and provided infomrmation for appoiintments to be secured and will f/u to confirm.     [facility] CM Care Plan Problem Two     Most Recent Value  Care Plan Problem Two   Patient in SNF rehab s/p THR.  Role Documenting the Problem Two  Clinical Social Worker  Care Plan for Problem Two  Active  [facility] CM Short Term Goal #1   Patient and SNF will report progress with therapy while at SNF in the next 30 days.   THN CM Short Term Goal #1 Start Date  11/17/17  [facility] CM Short Term Goal #1 Met Date   11/24/17  Interventions for Short Term Goal #2   CSW encouraged and offered SNF benefits/coverage info to patient.   THN CM Short Term Goal #2   Patient will have adequate support and services at home prior to SNF dc in the next 30 days.   [facility]N CM Short Term Goal #2 Start Date  11/17/17  [facility] CM Short Term Goal #2 Met Date  12/19/17  Interventions for Short Term Goal #2  CSW completing assessment and coordinating services, support and  resources for home.     THN CM Care Plan Problem Three     Most Recent Value  Care Plan Problem Three  Patient with limited transportation support.  Role Documenting the Problem Three  Clinical Social Worker  Care Plan for Problem Three  Active  [facility]N CM Short Term Goal #1   Patient will apply for [facility] transportation services in the next 30 days.  [facility]N CM Short Term Goal #1 Start Date  11/24/17  [facility] CM Short Term Goal #1 Met Date  12/19/17  Interventions for Short Term Goal #1  CSW provided and assisting with [facility] application .   [facility]N CM Short Term Goal #2   Patient will be connected with alternate transportation support in the next 30days.   [facility]N CM Short Term Goal #2 Start Date  11/24/17  Interventions for Short Term Goal #2  CSW assisting with linking paitent with transportation support via insurance and community based services.        [name], MSW, [place] Worker  [place] [phone]

## 2017-12-29 ENCOUNTER — Ambulatory Visit (INDEPENDENT_AMBULATORY_CARE_PROVIDER_SITE_OTHER): Payer: Medicare Other | Admitting: Orthopaedic Surgery

## 2018-01-20 ENCOUNTER — Ambulatory Visit (INDEPENDENT_AMBULATORY_CARE_PROVIDER_SITE_OTHER): Payer: Medicare Other | Admitting: Orthopaedic Surgery

## 2018-01-23 ENCOUNTER — Ambulatory Visit (INDEPENDENT_AMBULATORY_CARE_PROVIDER_SITE_OTHER): Payer: Medicare Other

## 2018-01-23 ENCOUNTER — Other Ambulatory Visit (INDEPENDENT_AMBULATORY_CARE_PROVIDER_SITE_OTHER): Payer: Self-pay | Admitting: Orthopaedic Surgery

## 2018-01-23 ENCOUNTER — Encounter (INDEPENDENT_AMBULATORY_CARE_PROVIDER_SITE_OTHER): Payer: Self-pay | Admitting: Orthopaedic Surgery

## 2018-01-23 ENCOUNTER — Ambulatory Visit (INDEPENDENT_AMBULATORY_CARE_PROVIDER_SITE_OTHER): Payer: Medicare Other | Admitting: Orthopaedic Surgery

## 2018-01-23 DIAGNOSIS — Z96641 Presence of right artificial hip joint: Secondary | ICD-10-CM

## 2018-01-23 NOTE — Progress Notes (Signed)
3 month THA follow up plan  Patient now 2.5 months status post total hip arthroplasty. Wound is healed with no signs of complications or infection.  The patient has minimal pain and is back to normal daily activities. Radiographs reveal a total hip arthroplasty in good position, with no evidence of subsidence, loosening, or complicating features. It was reinforced that prophylactic antibiotics should be taken with any procedure including but not limited to dental work or colonoscopies.  We will plan on following up at the 6 month postop visit with radiographs at that time. As always, instructions were given to call with any questions or concerns in the interim.

## 2018-01-23 NOTE — Progress Notes (Deleted)
.  xutk

## 2018-02-03 ENCOUNTER — Ambulatory Visit (INDEPENDENT_AMBULATORY_CARE_PROVIDER_SITE_OTHER): Payer: Medicare Other | Admitting: Orthopaedic Surgery

## 2018-02-03 ENCOUNTER — Encounter (INDEPENDENT_AMBULATORY_CARE_PROVIDER_SITE_OTHER): Payer: Self-pay | Admitting: Orthopaedic Surgery

## 2018-02-03 DIAGNOSIS — M25551 Pain in right hip: Secondary | ICD-10-CM

## 2018-02-03 MED ORDER — METHYLPREDNISOLONE ACETATE 40 MG/ML IJ SUSP
40.0000 mg | INTRAMUSCULAR | Status: AC | PRN
  Administered 2018-02-03: 40 mg via INTRA_ARTICULAR

## 2018-02-03 MED ORDER — BUPIVACAINE HCL 0.5 % IJ SOLN
3.0000 mL | INTRAMUSCULAR | Status: AC | PRN
  Administered 2018-02-03: 3 mL via INTRA_ARTICULAR

## 2018-02-03 MED ORDER — LIDOCAINE HCL 1 % IJ SOLN
3.0000 mL | INTRAMUSCULAR | Status: AC | PRN
  Administered 2018-02-03: 3 mL

## 2018-02-03 NOTE — Progress Notes (Signed)
Office Visit Note   Patient: [name]           Date of Birth: 1961/06/20           MRN: [id number] Visit Date: 02/03/2018              Requested by: [name], MD [address] [place], [place] [address] PCP: [name], MD   Assessment & Plan: Visit Diagnoses:  1. Pain of right hip joint     Plan: Impression is right hip trochanteric bursitis versus abductor tendinosis.  We performed a cortisone injection today with good anesthetic relief.  Home exercise also discussed with the patient.  Follow-up in 3 months for his 6-month postoperative hip appointment.  He will need AP and lateral hip x-rays on return.  Follow-Up Instructions: Return in about 3 months (around 05/06/2018).   Orders:  No orders of the defined types were placed in this encounter.  No orders of the defined types were placed in this encounter.     Procedures: Large Joint Inj: R greater trochanter on 02/03/2018 9:03 AM Indications: pain Details: 22 G needle  Arthrogram: No  Medications: 3 mL lidocaine 1 %; 3 mL bupivacaine 0.5 %; 40 mg methylPREDNISolone acetate 40 MG/ML Patient was prepped and draped in the usual sterile fashion.       Clinical Data: No additional findings.   Subjective: Chief Complaint  Patient presents with  . Right Hip - Pain, Follow-up    Patient comes in today for new complaint of lateral right hip pain.  He denies any groin pain or anything related to his hip joint.  He denies any injuries.    Review of Systems  Constitutional: Negative.   All other systems reviewed and are negative.    Objective: Vital Signs: There were no vitals taken for this visit.  Physical Exam  Constitutional: He is oriented to person, place, and time. He appears well-developed and well-nourished.  Pulmonary/Chest: Effort normal.  Abdominal: Soft.  Neurological: He is alert and oriented to person, place, and time.  Skin: Skin is warm.  Psychiatric: He has a normal  mood and affect. His behavior is normal. Judgment and thought content normal.  Nursing note and vitals reviewed.   Ortho Exam Right hip exam shows a fully healed surgical scar.  He has pain that localizes to the trochanteric bursa with hip rotation.  Negative Stinchfield sign.  Negative straight leg.  Trochanteric bursa is point tender to palpation. Specialty Comments:  No specialty comments available.  Imaging: No results found.   PMFS History: Patient Active Problem List   Diagnosis Date Noted  . Right hip pain 12/04/2017  . 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 Hip joint replacement status   . Preop exam for internal medicine 10/11/2017  . Pyuria 10/08/2017  . 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 Hypersomnolence 02/17/2015  . Syncope 02/16/2015  . Arthritis of right hip 09/05/2014  . Essential hypertension, benign 07/24/2014  . Preventative health care 07/22/2014  . Anxiety state 07/22/2014  . Alcohol dependence with withdrawal, uncomplicated (HCC) 06/25/2014  . Left hip pain 05/07/2013   Past Medical History:  Diagnosis Date  . Alcohol abuse    in the past  . Anxiety   . Cardiomyopathy (HCC)    hx of    Family History  Problem Relation Age of Onset  . Colon cancer Brother   . Heart failure Brother   . Liver  cancer Brother   . Diabetes Paternal Uncle   . Stroke Neg Hx   . Learning disabilities Neg Hx   . Hypertension Neg Hx   . Heart disease Neg Hx     Past Surgical History:  Procedure Laterality Date  . APPENDECTOMY    . HERNIA REPAIR    . 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 TOTAL HIP ARTHROPLASTY Right 11/12/2017   Procedure: RIGHT TOTAL HIP ARTHROPLASTY ANTERIOR APPROACH;  Surgeon: Tarry KosXu, Naiping M, MD;  Location: [facility] OR;  Service: Orthopedics;  Laterality: Right;   Social History   Occupational  History  . Occupation: Copywriter, advertisingurniture    Employer: [employer]  Tobacco Use  . Smoking status: Current Some Day Smoker    Packs/day: 0.25    Years: 4.00    Pack years: 1.00    Types: Cigarettes  . Smokeless tobacco: Never Used  . Tobacco comment: stopped a few days ago as instructed by Dr, Roda ShuttersXu  Substance and Sexual Activity  . Alcohol use: Yes    Comment: occasionally  . Drug use: No  . Sexual activity: Yes    Birth control/protection: None

## 2018-02-14 IMAGING — RF DG HIP (WITH PELVIS) OPERATIVE*R*
1 series · 3 of 3 positions shown · non-contrast
Comparison: Radiographs dated 09/10/2017

CLINICAL DATA: Osteoarthritis of the right hip. Status post right
total hip arthroplasty.

EXAM:
DG C-ARM 61-120 MIN; OPERATIVE RIGHT HIP WITH PELVIS

[Series 1: run · 3 of 3 slices shown]
[im 1/3]
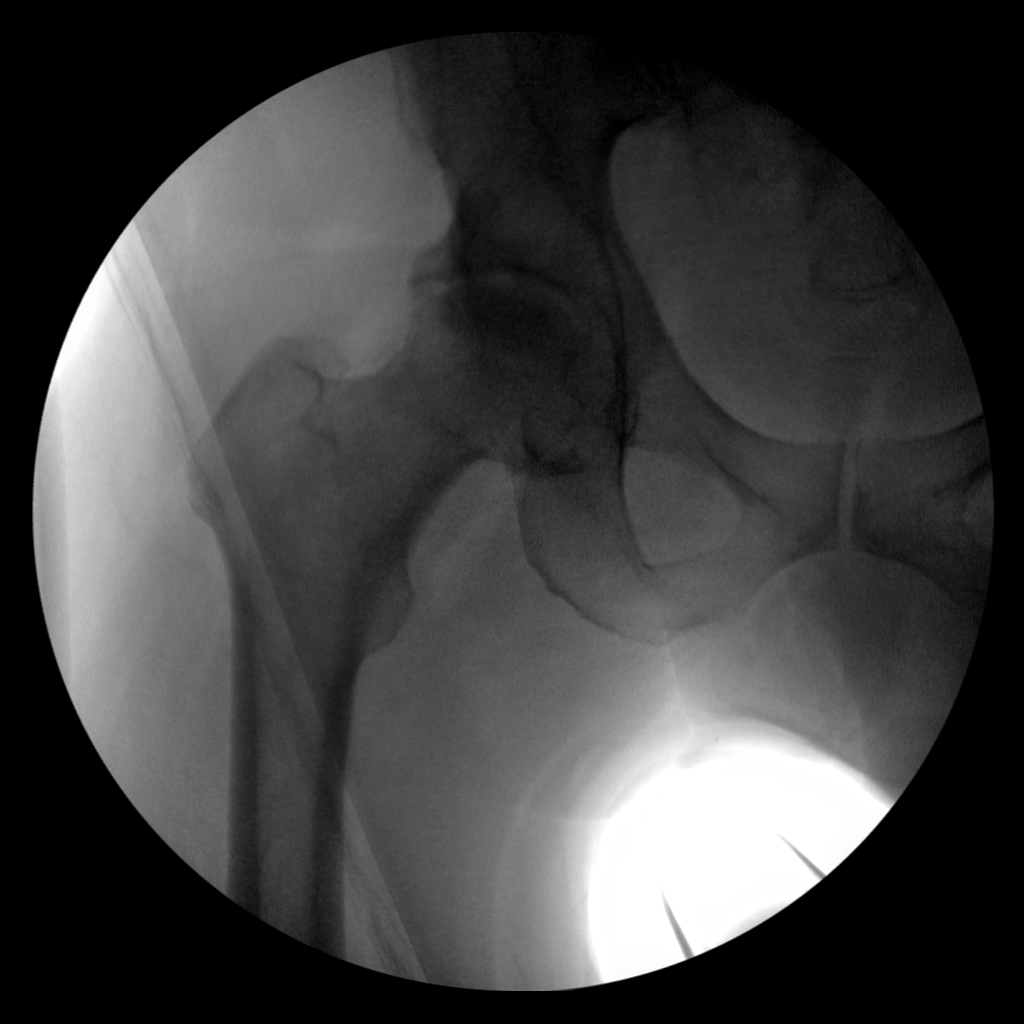
[im 2/3]
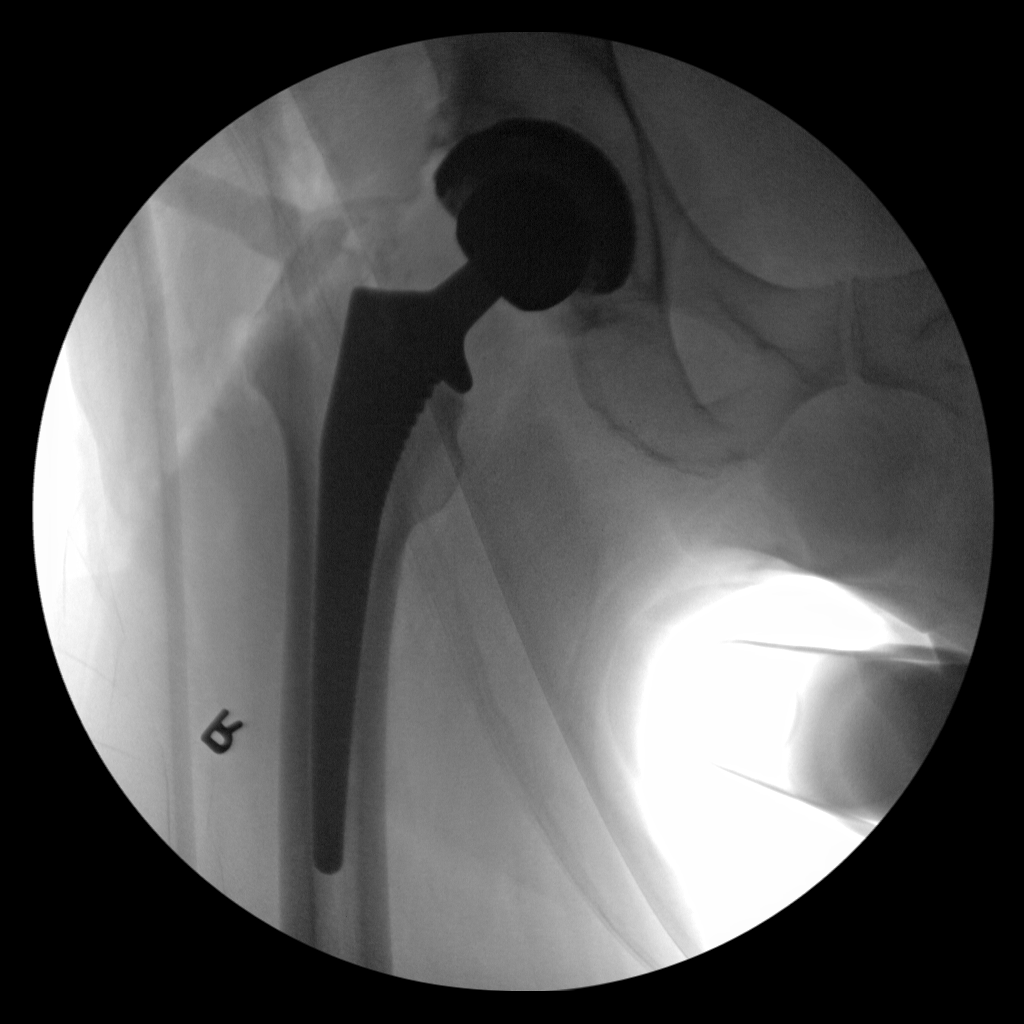
[im 3/3]
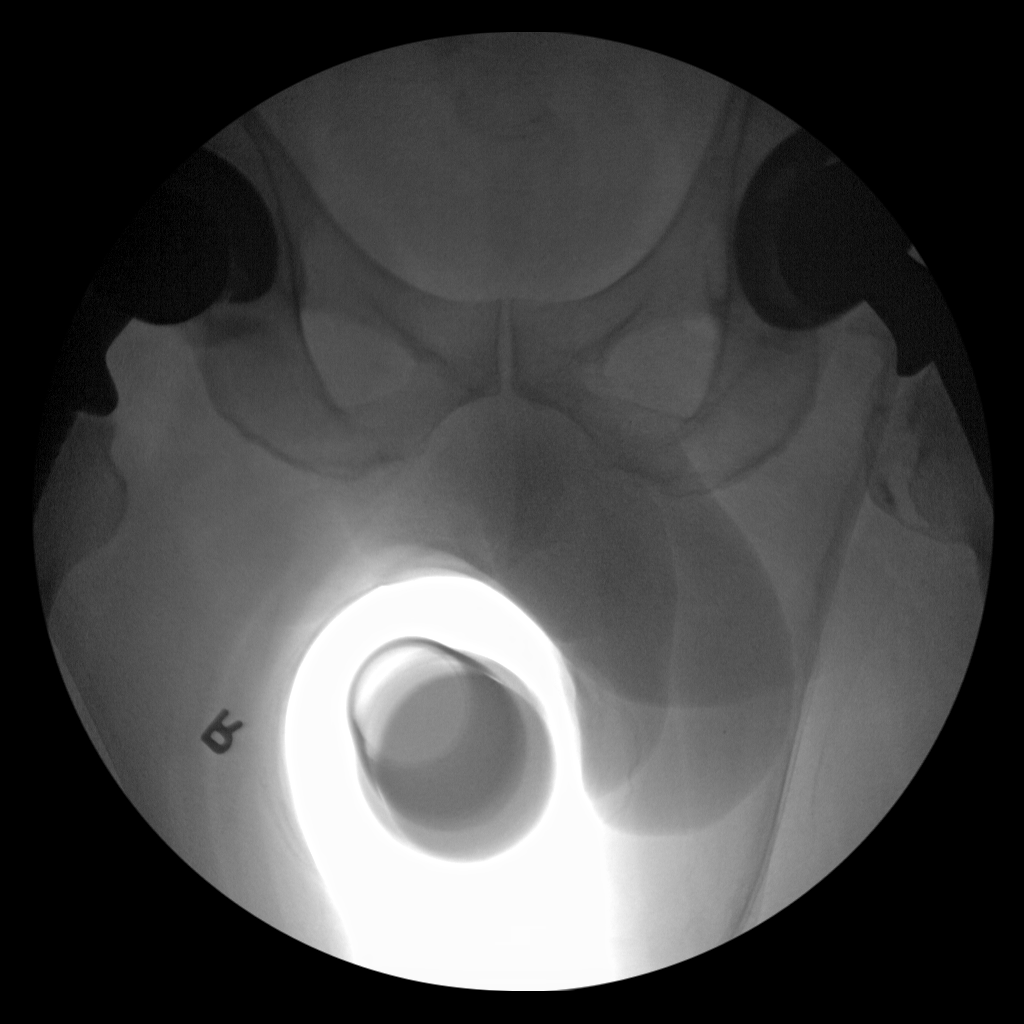

[3 of 3 positions shown; findings below may reference images not displayed]

FINDINGS: AP views of the right hip demonstrate the patient has undergone
right total hip arthroplasty. The components appear in excellent
position in the AP projection. No fractures.
IMPRESSION: Satisfactory appearance of the right hip in the AP projection after
total hip arthroplasty.

## 2018-02-14 IMAGING — DX DG PORTABLE PELVIS
1 series · 1 of 1 positions shown · non-contrast
Comparison: 10/15/2013

CLINICAL DATA: Right hip replacement

EXAM:
PORTABLE PELVIS 1-2 VIEWS

[pelvis ap]
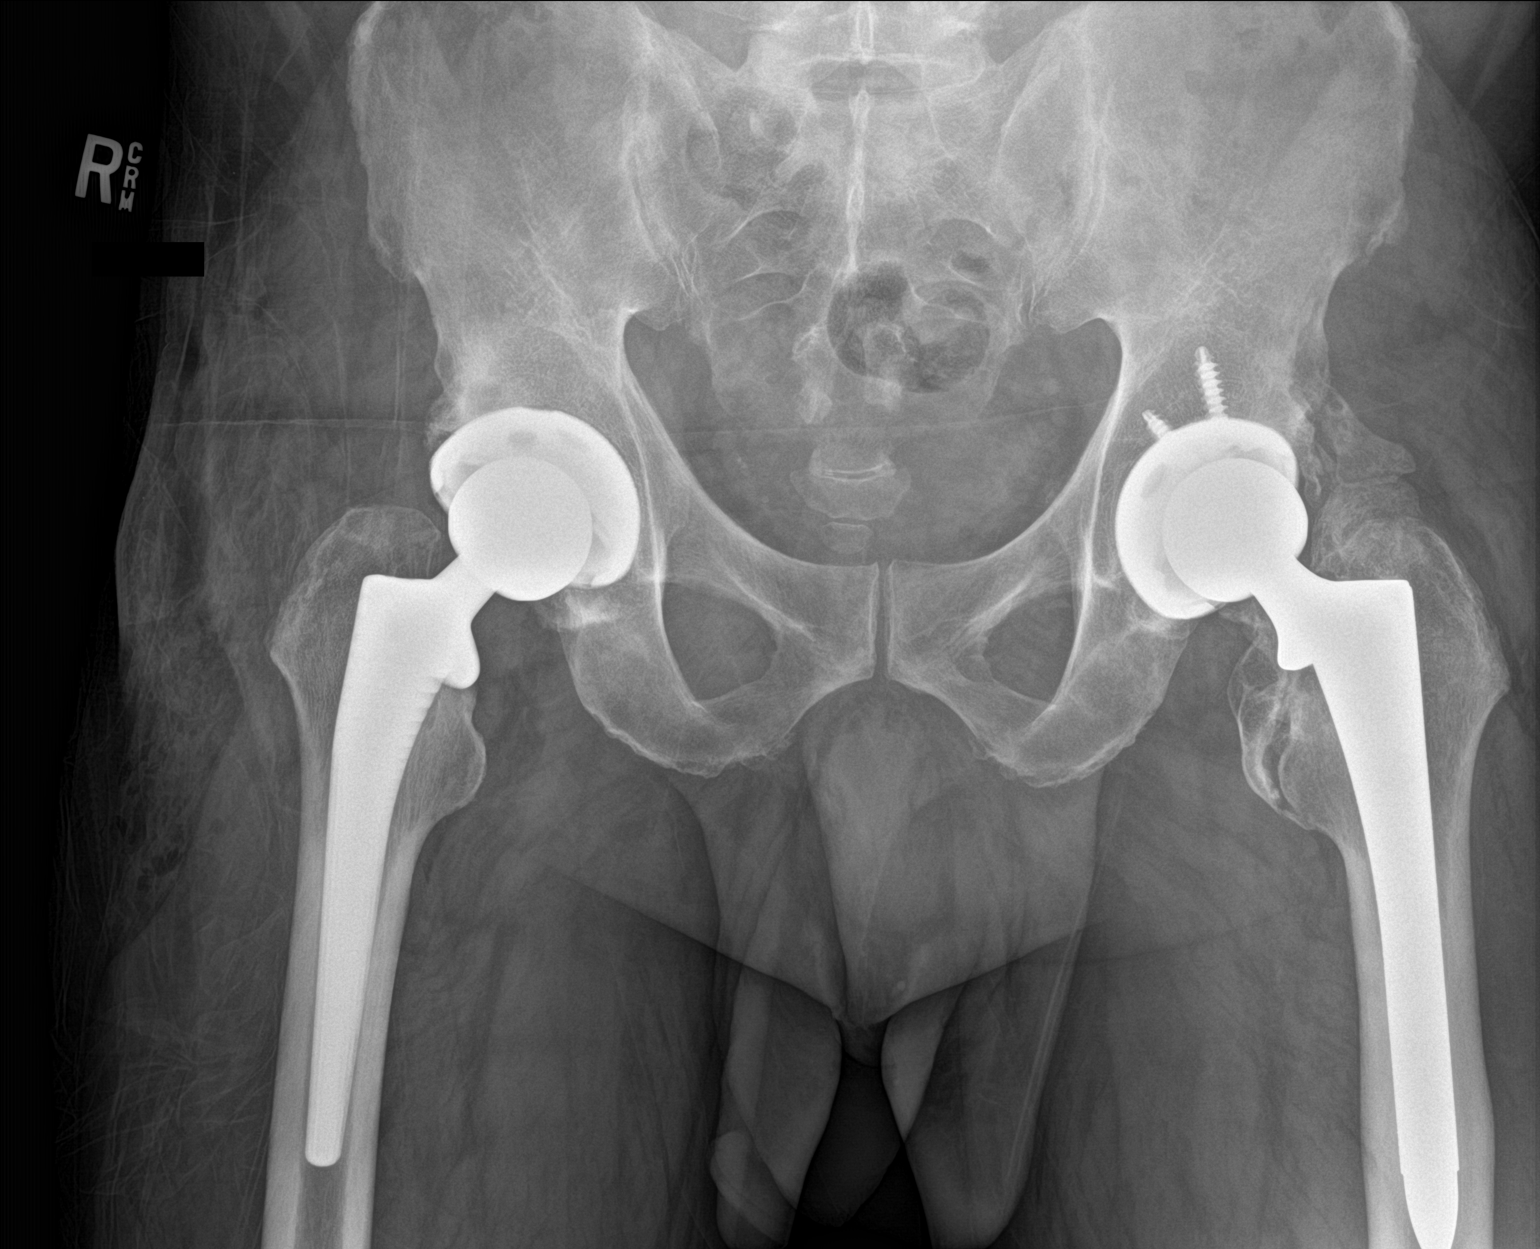

[1 of 1 positions shown; findings below may reference images not displayed]

FINDINGS: Right hip replacement in satisfactory position and alignment. No
immediate complication

Pre-existing left hip replacement with heterotopic ossification
around the joint.
IMPRESSION: Satisfactory right hip replacement.

## 2018-03-02 ENCOUNTER — Encounter: Payer: Self-pay | Admitting: Internal Medicine

## 2018-04-07 ENCOUNTER — Ambulatory Visit: Payer: Medicare Other | Admitting: Internal Medicine

## 2018-04-07 DIAGNOSIS — Z0289 Encounter for other administrative examinations: Secondary | ICD-10-CM

## 2018-05-07 ENCOUNTER — Ambulatory Visit (INDEPENDENT_AMBULATORY_CARE_PROVIDER_SITE_OTHER): Payer: Medicare Other | Admitting: Orthopaedic Surgery

## 2018-06-24 DIAGNOSIS — I10 Essential (primary) hypertension: Secondary | ICD-10-CM | POA: Diagnosis not present

## 2018-11-27 ENCOUNTER — Other Ambulatory Visit: Payer: Self-pay

## 2018-11-27 ENCOUNTER — Encounter (HOSPITAL_COMMUNITY): Payer: Self-pay

## 2018-11-27 DIAGNOSIS — F1099 Alcohol use, unspecified with unspecified alcohol-induced disorder: Secondary | ICD-10-CM | POA: Insufficient documentation

## 2018-11-27 DIAGNOSIS — Z5321 Procedure and treatment not carried out due to patient leaving prior to being seen by health care provider: Secondary | ICD-10-CM | POA: Insufficient documentation

## 2018-11-27 LAB — CBC
HCT: 44.5 % (ref 39.0–52.0)
Hemoglobin: 14.7 g/dL (ref 13.0–17.0)
MCH: 31.1 pg (ref 26.0–34.0)
MCHC: 33 g/dL (ref 30.0–36.0)
MCV: 94.1 fL (ref 80.0–100.0)
NRBC: 0 % (ref 0.0–0.2)
PLATELETS: 136 10*3/uL — AB (ref 150–400)
RBC: 4.73 MIL/uL (ref 4.22–5.81)
RDW: 14 % (ref 11.5–15.5)
WBC: 5.2 10*3/uL (ref 4.0–10.5)

## 2018-11-27 LAB — COMPREHENSIVE METABOLIC PANEL
ALBUMIN: 4.6 g/dL (ref 3.5–5.0)
ALK PHOS: 69 U/L (ref 38–126)
ALT: 33 U/L (ref 0–44)
ANION GAP: 23 — AB (ref 5–15)
AST: 90 U/L — ABNORMAL HIGH (ref 15–41)
BILIRUBIN TOTAL: 0.9 mg/dL (ref 0.3–1.2)
BUN: 9 mg/dL (ref 6–20)
CALCIUM: 8.7 mg/dL — AB (ref 8.9–10.3)
CO2: 19 mmol/L — ABNORMAL LOW (ref 22–32)
Chloride: 99 mmol/L (ref 98–111)
Creatinine, Ser: 0.73 mg/dL (ref 0.61–1.24)
GFR calc Af Amer: >60 mL/min (ref >60)
Glucose, Bld: 84 mg/dL (ref 70–99)
POTASSIUM: 3.7 mmol/L (ref 3.5–5.1)
Sodium: 141 mmol/L (ref 135–145)
TOTAL PROTEIN: 7.6 g/dL (ref 6.5–8.1)

## 2018-11-27 LAB — ETHANOL: ALCOHOL ETHYL (B): 379 mg/dL — AB (ref <10)

## 2018-11-27 NOTE — ED Triage Notes (Signed)
Pt reports that he has been falling more at home d/t ETOH use. He is requesting assistance with ETOH. A&Ox4.

## 2018-11-28 ENCOUNTER — Emergency Department (HOSPITAL_COMMUNITY)
Admission: EM | Admit: 2018-11-28 | Discharge: 2018-11-28 | Disposition: A | Discharge status: Home / Self Care | Payer: Medicare Other | Attending: Emergency Medicine | Admitting: Emergency Medicine

## 2019-04-21 ENCOUNTER — Ambulatory Visit: Payer: Self-pay | Admitting: Internal Medicine

## 2019-04-21 NOTE — Telephone Encounter (Signed)
Triage nurse transferred call and patient was no on line, called patient back and he did not answer

## 2019-04-21 NOTE — Telephone Encounter (Signed)
Needs ROV at his convenience

## 2019-04-21 NOTE — Telephone Encounter (Signed)
I have also tried to contact pt in regard to the triage note and received no answer as well.

## 2019-04-21 NOTE — Telephone Encounter (Signed)
Tammy from [facility] has a pt needing triaging.  Pt called said he needed an appt because having seizures.   He is having shortness of breath and sleeping a lot.  He feels like he can't breath when he is sleeping.     See triage notes for details.  My brother helps look after me.   My wife died about a year ago and it's been rough.   I talked with Dr. Jonny Ruiz about it and he said I had a lot of issues I was dealing with after my wife died.  I warm transferred him to Sam in Dr. Raphael Gibney office to be scheduled.  (COVID-19 pandemic).      Answer Assessment - Initial Assessment Questions 1. RESPIRATORY STATUS: "Describe your breathing?" (e.g., wheezing, shortness of breath, unable to speak, severe coughing)      My breathing is alright now.   I fell yesterday and busted my nose up.   I don't know how it happened.   I was at a friend's house and "just fell out".   I've had this happen before.   I was on medication for seizures before.   "I didn't know what happened".   He wasn't able to say what his friends said he did while out. He said "I'd had been drinking".   "Maybe a couple of shots".   "I think I've had this happen before when I was drinking".   I don't remember name of medicine I was on.   I stopped taking the medicine about a year or so ago.     I'm ok now.    I've been sleeping a lot.   When watching TV I just go sleep.   This is not normal for me.    Difficulty breathing protocol not used after talking with pt.    I'm drinking on a daily basis. 2. ONSET: "When did this breathing problem begin?"      *No Answer* 3. PATTERN "Does the difficult breathing come and go, or has it been constant since it started?"      *No Answer* 4. SEVERITY: "How bad is your breathing?" (e.g., mild, moderate, severe)    - MILD: No SOB at rest, mild SOB with walking, speaks normally in sentences, can lay down, no retractions, pulse < 100.    - MODERATE: SOB at rest, SOB with minimal exertion and prefers to sit, cannot  lie down flat, speaks in phrases, mild retractions, audible wheezing, pulse 100-120.    - SEVERE: Very SOB at rest, speaks in single words, struggling to breathe, sitting hunched forward, retractions, pulse > 120      *No Answer* 5. RECURRENT SYMPTOM: "Have you had difficulty breathing before?" If so, ask: "When was the last time?" and "What happened that time?"      *No Answer* 6. CARDIAC HISTORY: "Do you have any history of heart disease?" (e.g., heart attack, angina, bypass surgery, angioplasty)      *No Answer* 7. LUNG HISTORY: "Do you have any history of lung disease?"  (e.g., pulmonary embolus, asthma, emphysema)     *No Answer* 8. CAUSE: "What do you think is causing the breathing problem?"      *No Answer* 9. OTHER SYMPTOMS: "Do you have any other symptoms? (e.g., dizziness, runny nose, cough, chest pain, fever)     *No Answer* 10. PREGNANCY: "Is there any chance you are pregnant?" "When was your last menstrual period?"       *No Answer* 11. TRAVEL: "  Have you traveled out of the country in the last month?" (e.g., travel history, exposures)       *No Answer*  Protocols used: BREATHING DIFFICULTY-A-AH

## 2019-04-26 NOTE — Telephone Encounter (Signed)
appt scheduled

## 2019-04-27 ENCOUNTER — Encounter: Payer: Self-pay | Admitting: Internal Medicine

## 2019-04-27 ENCOUNTER — Ambulatory Visit (INDEPENDENT_AMBULATORY_CARE_PROVIDER_SITE_OTHER): Payer: Self-pay | Admitting: Internal Medicine

## 2019-04-27 ENCOUNTER — Other Ambulatory Visit (INDEPENDENT_AMBULATORY_CARE_PROVIDER_SITE_OTHER): Payer: Self-pay

## 2019-04-27 ENCOUNTER — Other Ambulatory Visit: Payer: Self-pay

## 2019-04-27 ENCOUNTER — Other Ambulatory Visit: Payer: Self-pay | Admitting: Internal Medicine

## 2019-04-27 VITALS — BP 144/96 | HR 119 | Temp 97.9°F | Ht 68.0 in | Wt 195.0 lb

## 2019-04-27 DIAGNOSIS — Z7141 Alcohol abuse counseling and surveillance of alcoholic: Secondary | ICD-10-CM

## 2019-04-27 DIAGNOSIS — E559 Vitamin D deficiency, unspecified: Secondary | ICD-10-CM

## 2019-04-27 DIAGNOSIS — R296 Repeated falls: Secondary | ICD-10-CM

## 2019-04-27 DIAGNOSIS — M25551 Pain in right hip: Secondary | ICD-10-CM

## 2019-04-27 DIAGNOSIS — R55 Syncope and collapse: Secondary | ICD-10-CM

## 2019-04-27 DIAGNOSIS — M545 Low back pain, unspecified: Secondary | ICD-10-CM

## 2019-04-27 DIAGNOSIS — F329 Major depressive disorder, single episode, unspecified: Secondary | ICD-10-CM

## 2019-04-27 DIAGNOSIS — I10 Essential (primary) hypertension: Secondary | ICD-10-CM

## 2019-04-27 DIAGNOSIS — R0600 Dyspnea, unspecified: Secondary | ICD-10-CM

## 2019-04-27 DIAGNOSIS — E538 Deficiency of other specified B group vitamins: Secondary | ICD-10-CM

## 2019-04-27 DIAGNOSIS — R739 Hyperglycemia, unspecified: Secondary | ICD-10-CM | POA: Insufficient documentation

## 2019-04-27 DIAGNOSIS — E611 Iron deficiency: Secondary | ICD-10-CM

## 2019-04-27 DIAGNOSIS — M25552 Pain in left hip: Secondary | ICD-10-CM

## 2019-04-27 DIAGNOSIS — Z0001 Encounter for general adult medical examination with abnormal findings: Secondary | ICD-10-CM

## 2019-04-27 DIAGNOSIS — F4321 Adjustment disorder with depressed mood: Secondary | ICD-10-CM | POA: Insufficient documentation

## 2019-04-27 DIAGNOSIS — F419 Anxiety disorder, unspecified: Secondary | ICD-10-CM

## 2019-04-27 LAB — CBC WITH DIFFERENTIAL/PLATELET
Basophils Absolute: 0 10*3/uL (ref 0.0–0.1)
Basophils Relative: 0.6 % (ref 0.0–3.0)
Eosinophils Absolute: 0.1 10*3/uL (ref 0.0–0.7)
Eosinophils Relative: 1 % (ref 0.0–5.0)
HCT: 42 % (ref 39.0–52.0)
Hemoglobin: 14.6 g/dL (ref 13.0–17.0)
Lymphocytes Relative: 15.6 % (ref 12.0–46.0)
Lymphs Abs: 1.2 10*3/uL (ref 0.7–4.0)
MCHC: 34.8 g/dL (ref 30.0–36.0)
MCV: 94.3 fl (ref 78.0–100.0)
Monocytes Absolute: 0.5 10*3/uL (ref 0.1–1.0)
Monocytes Relative: 6.4 % (ref 3.0–12.0)
Neutro Abs: 5.7 10*3/uL (ref 1.4–7.7)
Neutrophils Relative %: 76.4 % (ref 43.0–77.0)
Platelets: 153 10*3/uL (ref 150.0–400.0)
RBC: 4.46 Mil/uL (ref 4.22–5.81)
RDW: 14.4 % (ref 11.5–15.5)
WBC: 7.4 10*3/uL (ref 4.0–10.5)

## 2019-04-27 LAB — TSH: TSH: 1.18 u[IU]/mL (ref 0.35–4.50)

## 2019-04-27 LAB — LIPID PANEL
Cholesterol: 213 mg/dL — ABNORMAL HIGH (ref 0–200)
HDL: 78.3 mg/dL (ref >39.00)
LDL Cholesterol: 108 mg/dL — ABNORMAL HIGH (ref 0–99)
NonHDL: 135.02
Total CHOL/HDL Ratio: 3
Triglycerides: 135 mg/dL (ref 0.0–149.0)
VLDL: 27 mg/dL (ref 0.0–40.0)

## 2019-04-27 LAB — BASIC METABOLIC PANEL
BUN: 8 mg/dL (ref 6–23)
CO2: 27 mEq/L (ref 19–32)
Calcium: 9 mg/dL (ref 8.4–10.5)
Chloride: 92 mEq/L — ABNORMAL LOW (ref 96–112)
Creatinine, Ser: 0.73 mg/dL (ref 0.40–1.50)
GFR: 134.15 mL/min (ref >60.00)
Glucose, Bld: 101 mg/dL — ABNORMAL HIGH (ref 70–99)
Potassium: 3.9 mEq/L (ref 3.5–5.1)
Sodium: 133 mEq/L — ABNORMAL LOW (ref 135–145)

## 2019-04-27 LAB — HEPATIC FUNCTION PANEL
ALT: 49 U/L (ref 0–53)
AST: 108 U/L — ABNORMAL HIGH (ref 0–37)
Albumin: 4.8 g/dL (ref 3.5–5.2)
Alkaline Phosphatase: 79 U/L (ref 39–117)
Bilirubin, Direct: 0.4 mg/dL — ABNORMAL HIGH (ref 0.0–0.3)
Total Bilirubin: 1.4 mg/dL — ABNORMAL HIGH (ref 0.2–1.2)
Total Protein: 7.6 g/dL (ref 6.0–8.3)

## 2019-04-27 LAB — VITAMIN B12: Vitamin B-12: 210 pg/mL — ABNORMAL LOW (ref 211–911)

## 2019-04-27 LAB — URINALYSIS, ROUTINE W REFLEX MICROSCOPIC
Hgb urine dipstick: NEGATIVE
Ketones, ur: 40 — AB
Nitrite: NEGATIVE
Specific Gravity, Urine: 1.02 (ref 1.000–1.030)
Total Protein, Urine: 100 — AB
Urine Glucose: NEGATIVE
Urobilinogen, UA: 1 (ref 0.0–1.0)
pH: 8 (ref 5.0–8.0)

## 2019-04-27 LAB — BRAIN NATRIURETIC PEPTIDE: Pro B Natriuretic peptide (BNP): 16 pg/mL (ref 0.0–100.0)

## 2019-04-27 LAB — HEMOGLOBIN A1C: Hgb A1c MFr Bld: 5.2 % (ref 4.6–6.5)

## 2019-04-27 LAB — IBC PANEL
Iron: 334 ug/dL — ABNORMAL HIGH (ref 42–165)
Saturation Ratios: 100.7 % — ABNORMAL HIGH (ref 20.0–50.0)
Transferrin: 237 mg/dL (ref 212.0–360.0)

## 2019-04-27 LAB — VITAMIN D 25 HYDROXY (VIT D DEFICIENCY, FRACTURES): VITD: 18.33 ng/mL — ABNORMAL LOW (ref 30.00–100.00)

## 2019-04-27 LAB — PSA: PSA: 4.36 ng/mL — ABNORMAL HIGH (ref 0.10–4.00)

## 2019-04-27 MED ORDER — VITAMIN D (ERGOCALCIFEROL) 1.25 MG (50000 UNIT) PO CAPS: 50000.0000 [IU] | ORAL_CAPSULE | ORAL | 0 refills | Status: DC

## 2019-04-27 MED ORDER — CIPROFLOXACIN HCL 500 MG PO TABS: 500.0000 mg | ORAL_TABLET | Freq: Two times a day (BID) | ORAL | 0 refills | Status: AC

## 2019-04-27 MED ORDER — OXYCODONE-ACETAMINOPHEN 7.5-325 MG PO TABS: 1.0000 | ORAL_TABLET | ORAL | 0 refills | Status: DC | PRN

## 2019-04-27 MED ORDER — CITALOPRAM HYDROBROMIDE 20 MG PO TABS: 20.0000 mg | ORAL_TABLET | Freq: Every day | ORAL | 3 refills | Status: DC

## 2019-04-27 NOTE — Progress Notes (Signed)
Subjective:    Patient ID: [name], male    DOB: [birth year], 58 y.o.   MRN: [id number]  HPI  Here for wellness and f/u;  Overall doing ok;  Pt denies neurological change such as new headache, facial or extremity weakness.  Pt denies polydipsia, polyuria, or low sugar symptoms. Pt states overall good compliance with treatment and medications, good tolerability, and has been trying to follow appropriate diet. . No fever, night sweats, wt loss, loss of appetite, or other constitutional symptoms.  Pt states fair ability with ADL's, has at least moderate fall risk, home safety reviewed and adequate, no other significant changes in hearing or vision, and not active with exercise. Also c/o rather severe unexplained recurrent falls with frank syncope at least 4-5 over the past month and 3 times in the past wk, for unclear reasons; has hx of siezure but none of episodes witnessed and he cannot recall further details such as prodromal symptoms.  Hsa fallen at least once to right hip, and also the left hip, now both very sore and stiff, walking with cane today for stability as balance much worse now, just started doing this in the past week.  Pt is s/p right hip replacement.  Denies recent increased ETOH use, only drinks "a couple of beers" every few days.   Also of note is wife died about 1 yr ago, and admits to worsening depression without suicidal ideation, or panic; has ongoing anxiety as well. Past Medical History:  Diagnosis Date  . Alcohol abuse    in the past  . Anxiety   . Cardiomyopathy (HCC)    hx of   Past Surgical History:  Procedure Laterality Date  . APPENDECTOMY    . HERNIA REPAIR    . TOTAL HIP ARTHROPLASTY Left 10/15/2013   Procedure: LEFT TOTAL HIP ARTHROPLASTY;  Surgeon: Thera Flake., MD;  Location: [facility] OR;  Service: Orthopedics;  Laterality: Left;  . TOTAL HIP ARTHROPLASTY Right 11/12/2017   Procedure: RIGHT TOTAL HIP ARTHROPLASTY ANTERIOR APPROACH;  Surgeon: Tarry Kos, MD;  Location: [facility] OR;  Service: Orthopedics;  Laterality: Right;    reports that he has been smoking cigarettes. He has a 1.00 pack-year smoking history. He has never used smokeless tobacco. He reports current alcohol use. He reports that he does not use drugs. family history includes Colon cancer in his brother; Diabetes in his paternal uncle; Heart failure in his brother; Liver cancer in his brother. No Known Allergies Current Outpatient Medications on File Prior to Visit  Medication Sig Dispense Refill  . aspirin EC 325 MG tablet Take 1 tablet (325 mg total) by mouth 2 (two) times daily. 84 tablet 0  . ondansetron (ZOFRAN) 4 MG tablet Take 1-2 tablets (4-8 mg total) by mouth every 8 (eight) hours as needed for nausea or vomiting. 40 tablet 0  . promethazine (PHENERGAN) 25 MG tablet Take 1 tablet (25 mg total) by mouth every 6 (six) hours as needed for nausea. 30 tablet 1  . senna-docusate (SENOKOT S) 8.6-50 MG tablet Take 1 tablet by mouth at bedtime as needed. 30 tablet 1  . tiZANidine (ZANAFLEX) 4 MG tablet Take 1 tablet (4 mg total) by mouth every 6 (six) hours as needed for muscle spasms. 30 tablet 2  . traMADol (ULTRAM) 50 MG tablet Take 1-2 tablets (50-100 mg total) by mouth 3 (three) times daily as needed. 30 tablet 2   No current facility-administered medications on file prior to visit.  Review of Systems Constitutional: Negative for other unusual diaphoresis, sweats, appetite or weight changes HENT: Negative for other worsening hearing loss, ear pain, facial swelling, mouth sores or neck stiffness.   Eyes: Negative for other worsening pain, redness or other visual disturbance.  Respiratory: Negative for other stridor or swelling Cardiovascular: Negative for other palpitations or other chest pain  Gastrointestinal: Negative for worsening diarrhea or loose stools, blood in stool, distention or other pain Genitourinary: Negative for hematuria, flank pain or other change in urine  volume.  Musculoskeletal: Negative for myalgias or other joint swelling.  Skin: Negative for other color change, or other wound or worsening drainage.  Neurological: Negative for other syncope or numbness. Hematological: Negative for other adenopathy or swelling Psychiatric/Behavioral: Negative for hallucinations, other worsening agitation, SI, self-injury, or new decreased concentration All other system neg per pt    Objective:   Physical Exam BP (!) 144/96   Pulse (!) 119   Temp 97.9 F (36.6 C) (Oral)   Ht 5\' 8"  (1.727 m)   Wt 195 lb (88.5 kg)   SpO2 97%   BMI 29.65 kg/m  VS noted, weak, slow movements to stand and walk, stiff in pain to hips with walking with cane, requires assit for up to exam table. Rather chronically ill appearing Constitutional: Pt is oriented to person, place, and time. Appears well-developed and well-nourished, in no significant distress and comfortable Head: Normocephalic and atraumatic  Eyes: Conjunctivae and EOM are normal. Pupils are equal, round, and reactive to light Right Ear: External ear normal without discharge Left Ear: External ear normal without discharge Nose: Nose without discharge or deformity Mouth/Throat: Oropharynx is without other ulcerations and moist  Neck: Normal range of motion. Neck supple. No JVD present. No tracheal deviation present or significant neck LA or mass Cardiovascular: Normal rate, regular rhythm, normal heart sounds and intact distal pulses.   Pulmonary/Chest: WOB normal and breath sounds without rales or wheezing  Abdominal: Soft. Bowel sounds are normal. NT. No HSM  Musculoskeletal: Normal range of motion. Exhibits no edema Lymphadenopathy: Has no other cervical adenopathy.  Neurological: Pt is alert and oriented to person, place, and time. Pt has normal reflexes. No cranial nerve deficit. Motor grossly intact, Gait intact Skin: Skin is warm and dry. No rash noted or new ulcerations Psychiatric:  Has depressed mood  and affect. Behavior is normal without agitation No other exam findings Lab Results  Component Value Date   WBC 7.4 04/27/2019   HGB 14.6 04/27/2019   HCT 42.0 04/27/2019   PLT 153.0 04/27/2019   GLUCOSE 101 (H) 04/27/2019   CHOL 213 (H) 04/27/2019   TRIG 135.0 04/27/2019   HDL 78.30 04/27/2019   LDLDIRECT 109.8 07/22/2014   LDLCALC 108 (H) 04/27/2019   ALT 49 04/27/2019   AST 108 (H) 04/27/2019   NA 133 (L) 04/27/2019   K 3.9 04/27/2019   CL 92 (L) 04/27/2019   CREATININE 0.73 04/27/2019   BUN 8 04/27/2019   CO2 27 04/27/2019   TSH 1.18 04/27/2019   PSA 4.36 (H) 04/27/2019   INR 1.00 11/05/2017   HGBA1C 5.2 04/27/2019      Assessment & Plan:

## 2019-04-27 NOTE — Patient Instructions (Signed)
Please take all new medication as prescribed - the pain medication, and celexa 20 mg per day  Please call if you wish to be referred for counseling  Please continue all other medications as before, and refills have been done if requested.  Please have the pharmacy call with any other refills you may need.  Please continue your efforts at being more active, low cholesterol diet, and weight control.  You are otherwise up to date with prevention measures today.  Please keep your appointments with your specialists as you may have planned  You will be contacted regarding the referral for: Cardiology, Neurology, EEG (brain wave test), Head MRI, Echocardiogram, and orthopedic referral  Please go to the XRAY Department in the Basement (go straight as you get off the elevator) for the x-ray testing  Please go to the LAB in the Basement (turn left off the elevator) for the tests to be done today  You will be contacted by phone if any changes need to be made immediately.  Otherwise, you will receive a letter about your results with an explanation, but please check with MyChart first.  Please remember to sign up for MyChart if you have not done so, as this will be important to you in the future with finding out test results, communicating by private email, and scheduling acute appointments online when needed.  Please return in 3 weeks, or sooner if needed

## 2019-04-28 ENCOUNTER — Telehealth: Payer: Self-pay

## 2019-04-28 ENCOUNTER — Encounter: Payer: Self-pay | Admitting: Internal Medicine

## 2019-04-28 NOTE — Assessment & Plan Note (Signed)
Declines PT evaluation, cont cane use

## 2019-04-28 NOTE — Assessment & Plan Note (Signed)
Etiology unclear, ecg reviewed, for cxr, labs including BNP, card and neurology referral, EEG, Head MRI, echo  In addition to the time spent performing CPE, I spent an additional 40 minutes face to face,in which greater than 50% of this time was spent in counseling and coordination of care for patient's acute illness as documented, including the differential dx, treatment, further evaluation and other management of syncope, recurrent falls, bilat hip pain, grief, anxiety and depression, ETOH abuse hx

## 2019-04-28 NOTE — Assessment & Plan Note (Signed)
Denies ongoing abuse, cont to follow

## 2019-04-28 NOTE — Assessment & Plan Note (Signed)

## 2019-04-28 NOTE — Telephone Encounter (Signed)
I have attempted to reach patient, phone continuously rung then the call dropped with a busy signal.   CRM created.

## 2019-04-28 NOTE — Assessment & Plan Note (Signed)
stable overall by history and exam, recent data reviewed with pt, and pt to continue medical treatment as before,  to f/u any worsening symptoms or concerns, for a1c with labs 

## 2019-04-28 NOTE — Assessment & Plan Note (Signed)
At least mod, for celexa 200 qd,,  to f/u any worsening symptoms or concerns

## 2019-04-28 NOTE — Assessment & Plan Note (Signed)
For Vit D level with labs 

## 2019-04-28 NOTE — Assessment & Plan Note (Signed)
stable overall by history and exam, recent data reviewed with pt, and pt to continue medical treatment as before,  to f/u any worsening symptoms or concerns  

## 2019-04-28 NOTE — Telephone Encounter (Signed)
-----   Message from [name], MD sent at 04/27/2019  6:52 PM EDT ----- Letter sent, cont same tx except  We need to: 1) start Vitamin D 66599 units weekly for 12 wks, then after that take OTC Vitamin D3 at 2000 units per day 2)  Start B12 shots (the first can be done at your next appt in 3 weeks) 3)  The elevated PSA can be related to infection, so please start cipro at 500 mg twice per day for 3 weeks, and the PSA can then be checked again at your next visit as well.    [name] to please inform pt, I will do rx x 2, and b12 IM to be given next appt in 3 wks as he is planned to return

## 2019-04-28 NOTE — Assessment & Plan Note (Signed)
S/p replacement, For right hip film, refer ortho

## 2019-04-28 NOTE — Assessment & Plan Note (Signed)
For film, refer ortho Dr Roda Shutters

## 2019-04-28 NOTE — Assessment & Plan Note (Signed)
D/w pt, delcines counseling referral

## 2019-04-28 NOTE — Assessment & Plan Note (Signed)
For b12 lab today

## 2019-04-28 NOTE — Assessment & Plan Note (Signed)
Also for f/u ls spine films

## 2019-05-05 ENCOUNTER — Encounter: Payer: Self-pay | Admitting: Internal Medicine

## 2019-05-06 ENCOUNTER — Encounter (HOSPITAL_COMMUNITY): Payer: Self-pay | Admitting: Internal Medicine

## 2019-05-18 ENCOUNTER — Ambulatory Visit: Payer: Self-pay | Admitting: Internal Medicine

## 2019-05-18 ENCOUNTER — Encounter: Payer: Self-pay | Admitting: Internal Medicine

## 2019-05-20 ENCOUNTER — Encounter: Payer: Self-pay | Admitting: Internal Medicine

## 2019-05-20 ENCOUNTER — Other Ambulatory Visit: Payer: Self-pay

## 2019-05-20 ENCOUNTER — Ambulatory Visit (INDEPENDENT_AMBULATORY_CARE_PROVIDER_SITE_OTHER): Payer: Medicare HMO | Admitting: Internal Medicine

## 2019-05-20 VITALS — BP 124/82 | HR 124 | Temp 98.3°F | Ht 68.0 in | Wt 193.0 lb

## 2019-05-20 DIAGNOSIS — R739 Hyperglycemia, unspecified: Secondary | ICD-10-CM | POA: Diagnosis not present

## 2019-05-20 DIAGNOSIS — G4700 Insomnia, unspecified: Secondary | ICD-10-CM

## 2019-05-20 DIAGNOSIS — F329 Major depressive disorder, single episode, unspecified: Secondary | ICD-10-CM

## 2019-05-20 DIAGNOSIS — G4710 Hypersomnia, unspecified: Secondary | ICD-10-CM

## 2019-05-20 DIAGNOSIS — M545 Low back pain, unspecified: Secondary | ICD-10-CM

## 2019-05-20 DIAGNOSIS — F419 Anxiety disorder, unspecified: Secondary | ICD-10-CM | POA: Diagnosis not present

## 2019-05-20 DIAGNOSIS — F32A Depression, unspecified: Secondary | ICD-10-CM

## 2019-05-20 MED ORDER — OXYCODONE-ACETAMINOPHEN 7.5-325 MG PO TABS: 1.0000 | ORAL_TABLET | ORAL | 0 refills | Status: DC | PRN

## 2019-05-20 MED ORDER — CITALOPRAM HYDROBROMIDE 20 MG PO TABS: 10.0000 mg | ORAL_TABLET | Freq: Every day | ORAL | 3 refills | Status: AC

## 2019-05-20 MED ORDER — ZOLPIDEM TARTRATE 10 MG PO TABS: 10.0000 mg | ORAL_TABLET | Freq: Every evening | ORAL | 2 refills | Status: DC | PRN

## 2019-05-20 NOTE — Patient Instructions (Addendum)
OK to decrease the celexa to 10 mg per day (the depression pill)  Please take all new medication as prescribed  - the ambien as needed at bedtime for sleep  You will be contacted regarding the referral for: Pulmonary to see about possible sleep apnea  Please continue all other medications as before, including the percocet  Please have the pharmacy call with any other refills you may need.  Please continue your efforts at being more active, low cholesterol diet, and weight control.  Please keep your appointments with your specialists as you may have planned  Please return in 3 months, or sooner if needed

## 2019-05-20 NOTE — Progress Notes (Signed)
Subjective:    Patient ID: [name], male    DOB: [birth year], 58 y.o.   MRN: [id number]  HPI  Here to f/u; overall doing ok,  Pt denies chest pain, increasing sob or doe, wheezing, orthopnea, PND, increased LE swelling, palpitations, dizziness or syncope.  Pt denies new neurological symptoms such as new headache, or facial or extremity weakness or numbness.  Pt denies polydipsia, polyuria, or low sugar episode.  Pt states overall good compliance with meds, mostly trying to follow appropriate diet, with wt overall stable,  but little exercise however.  Pt continues to have recurring LBP without change in severity, bowel or bladder change, fever, wt loss,  worsening LE pain/numbness/weakness, gait change or falls.  Also with recent worsening hypersomnolence, just cant seem to stay awake most days, but also having marked difficulty just getting to sleep most nights.  Also, HIps and knee pain improved.   Past Medical History:  Diagnosis Date  . Alcohol abuse    in the past  . Anxiety   . Cardiomyopathy ([place])    hx of   Past Surgical History:  Procedure Laterality Date  . APPENDECTOMY    . HERNIA REPAIR    . 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 TOTAL HIP ARTHROPLASTY Right 11/12/2017   Procedure: RIGHT TOTAL HIP ARTHROPLASTY ANTERIOR APPROACH;  Surgeon: Leandrew Koyanagi, MD;  Location: [place];  Service: Orthopedics;  Laterality: Right;    reports that he has been smoking cigarettes. He has a 1.00 pack-year smoking history. He has never used smokeless tobacco. He reports current alcohol use. He reports that he does not use drugs. family history includes Colon cancer in his brother; Diabetes in his paternal uncle; Heart failure in his brother; Liver cancer in his brother. No Known Allergies Current Outpatient Medications on File Prior to Visit  Medication  Sig Dispense Refill  . aspirin EC 325 MG tablet Take 1 tablet (325 mg total) by mouth 2 (two) times daily. 84 tablet 0  . ondansetron (ZOFRAN) 4 MG tablet Take 1-2 tablets (4-8 mg total) by mouth every 8 (eight) hours as needed for nausea or vomiting. 40 tablet 0  . promethazine (PHENERGAN) 25 MG tablet Take 1 tablet (25 mg total) by mouth every 6 (six) hours as needed for nausea. 30 tablet 1  . senna-docusate (SENOKOT S) 8.6-50 MG tablet Take 1 tablet by mouth at bedtime as needed. 30 tablet 1  . tiZANidine (ZANAFLEX) 4 MG tablet Take 1 tablet (4 mg total) by mouth every 6 (six) hours as needed for muscle spasms. 30 tablet 2  . traMADol (ULTRAM) 50 MG tablet Take 1-2 tablets (50-100 mg total) by mouth 3 (three) times daily as needed. 30 tablet 2  . Vitamin D, Ergocalciferol, (DRISDOL) 1.25 MG (50000 UT) CAPS capsule Take 1 capsule (50,000 Units total) by mouth every 7 (seven) days. 12 capsule 0   No current facility-administered medications on file prior to visit.    Review of Systems  Constitutional: Negative for other unusual diaphoresis or sweats HENT: Negative for ear discharge or swelling Eyes: Negative for other worsening visual disturbances Respiratory: Negative for stridor or other swelling  Gastrointestinal: Negative for worsening distension or other blood Genitourinary: Negative for retention or other urinary change Musculoskeletal: Negative for other MSK pain or swelling Skin: Negative for color change or other  new lesions Neurological: Negative for worsening tremors and other numbness  Psychiatric/Behavioral: Negative for worsening agitation or other fatigue All other system neg per pt    Objective:   Physical Exam BP 124/82   Pulse (!) 124   Temp 98.3 F (36.8 C) (Oral)   Ht 5\' 8"  (1.727 m)   Wt 193 lb (87.5 kg)   SpO2 96%   BMI 29.35 kg/m  VS noted,  Constitutional: Pt appears in NAD HENT: Head: NCAT.  Right Ear: External ear normal.  Left Ear: External ear  normal.  Eyes: . Pupils are equal, round, and reactive to light. Conjunctivae and EOM are normal Nose: without d/c or deformity Neck: Neck supple. Gross normal ROM Cardiovascular: Normal rate and regular rhythm.   Pulmonary/Chest: Effort normal and breath sounds without rales or wheezing.  Abd:  Soft, NT, ND, + BS, no organomegaly Neurological: Pt is alert. At baseline orientation, motor grossly intact Skin: Skin is warm. No rashes, other new lesions, no LE edema Psychiatric: Pt behavior is normal without agitation  No other exam findings Lab Results  Component Value Date   WBC 7.4 04/27/2019   HGB 14.6 04/27/2019   HCT 42.0 04/27/2019   PLT 153.0 04/27/2019   GLUCOSE 101 (H) 04/27/2019   CHOL 213 (H) 04/27/2019   TRIG 135.0 04/27/2019   HDL 78.30 04/27/2019   LDLDIRECT 109.8 07/22/2014   LDLCALC 108 (H) 04/27/2019   ALT 49 04/27/2019   AST 108 (H) 04/27/2019   NA 133 (L) 04/27/2019   K 3.9 04/27/2019   CL 92 (L) 04/27/2019   CREATININE 0.73 04/27/2019   BUN 8 04/27/2019   CO2 27 04/27/2019   TSH 1.18 04/27/2019   PSA 4.36 (H) 04/27/2019   INR 1.00 11/05/2017   HGBA1C 5.2 04/27/2019       Assessment & Plan:

## 2019-05-23 ENCOUNTER — Encounter: Payer: Self-pay | Admitting: Internal Medicine

## 2019-05-23 DIAGNOSIS — G4700 Insomnia, unspecified: Secondary | ICD-10-CM | POA: Insufficient documentation

## 2019-05-23 NOTE — Assessment & Plan Note (Signed)
For ambien 10 qhs prn,  to f/u any worsening symptoms or concerns

## 2019-05-23 NOTE — Assessment & Plan Note (Signed)
?   Sedation with the celexa, ok to decrease to 10 mg daily

## 2019-05-23 NOTE — Assessment & Plan Note (Signed)
Chronic persistent, no change, for percocet refill

## 2019-05-23 NOTE — Assessment & Plan Note (Signed)
Ok to refer to pulmonary r/o OSA

## 2019-05-23 NOTE — Assessment & Plan Note (Signed)
stable overall by history and exam, recent data reviewed with pt, and pt to continue medical treatment as before,  to f/u any worsening symptoms or concerns  

## 2019-06-03 ENCOUNTER — Encounter: Payer: Self-pay | Admitting: Internal Medicine

## 2019-06-10 ENCOUNTER — Other Ambulatory Visit: Payer: Medicare HMO

## 2019-06-27 ENCOUNTER — Other Ambulatory Visit: Payer: Self-pay

## 2019-06-27 ENCOUNTER — Emergency Department (HOSPITAL_COMMUNITY): Payer: Medicare HMO

## 2019-06-27 ENCOUNTER — Encounter (HOSPITAL_COMMUNITY): Payer: Self-pay | Admitting: Emergency Medicine

## 2019-06-27 ENCOUNTER — Inpatient Hospital Stay (HOSPITAL_COMMUNITY): Payer: Medicare HMO

## 2019-06-27 ENCOUNTER — Inpatient Hospital Stay (HOSPITAL_COMMUNITY)
Admission: EM | Admit: 2019-06-27 | Discharge: 2019-06-29 | DRG: 101 | Disposition: A | Discharge status: Home / Self Care | Payer: Medicare HMO | Attending: Internal Medicine | Admitting: Internal Medicine

## 2019-06-27 DIAGNOSIS — E876 Hypokalemia: Secondary | ICD-10-CM | POA: Diagnosis present

## 2019-06-27 DIAGNOSIS — G40901 Epilepsy, unspecified, not intractable, with status epilepticus: Secondary | ICD-10-CM | POA: Diagnosis not present

## 2019-06-27 DIAGNOSIS — R569 Unspecified convulsions: Secondary | ICD-10-CM | POA: Diagnosis not present

## 2019-06-27 DIAGNOSIS — E7220 Disorder of urea cycle metabolism, unspecified: Secondary | ICD-10-CM | POA: Diagnosis present

## 2019-06-27 DIAGNOSIS — E872 Acidosis, unspecified: Secondary | ICD-10-CM | POA: Insufficient documentation

## 2019-06-27 DIAGNOSIS — I10 Essential (primary) hypertension: Secondary | ICD-10-CM | POA: Diagnosis not present

## 2019-06-27 DIAGNOSIS — F1010 Alcohol abuse, uncomplicated: Secondary | ICD-10-CM | POA: Diagnosis present

## 2019-06-27 DIAGNOSIS — N390 Urinary tract infection, site not specified: Secondary | ICD-10-CM | POA: Diagnosis present

## 2019-06-27 DIAGNOSIS — Z96643 Presence of artificial hip joint, bilateral: Secondary | ICD-10-CM | POA: Diagnosis present

## 2019-06-27 DIAGNOSIS — Z03818 Encounter for observation for suspected exposure to other biological agents ruled out: Secondary | ICD-10-CM | POA: Diagnosis not present

## 2019-06-27 DIAGNOSIS — R55 Syncope and collapse: Secondary | ICD-10-CM

## 2019-06-27 DIAGNOSIS — Z20828 Contact with and (suspected) exposure to other viral communicable diseases: Secondary | ICD-10-CM | POA: Diagnosis not present

## 2019-06-27 DIAGNOSIS — F17210 Nicotine dependence, cigarettes, uncomplicated: Secondary | ICD-10-CM | POA: Diagnosis present

## 2019-06-27 DIAGNOSIS — R296 Repeated falls: Secondary | ICD-10-CM

## 2019-06-27 DIAGNOSIS — G40401 Other generalized epilepsy and epileptic syndromes, not intractable, with status epilepticus: Principal | ICD-10-CM | POA: Diagnosis present

## 2019-06-27 DIAGNOSIS — R000 Tachycardia, unspecified: Secondary | ICD-10-CM | POA: Diagnosis not present

## 2019-06-27 DIAGNOSIS — F419 Anxiety disorder, unspecified: Secondary | ICD-10-CM | POA: Diagnosis present

## 2019-06-27 DIAGNOSIS — Z79899 Other long term (current) drug therapy: Secondary | ICD-10-CM

## 2019-06-27 DIAGNOSIS — Z7982 Long term (current) use of aspirin: Secondary | ICD-10-CM | POA: Diagnosis not present

## 2019-06-27 DIAGNOSIS — F10239 Alcohol dependence with withdrawal, unspecified: Secondary | ICD-10-CM

## 2019-06-27 DIAGNOSIS — R231 Pallor: Secondary | ICD-10-CM | POA: Diagnosis not present

## 2019-06-27 DIAGNOSIS — I429 Cardiomyopathy, unspecified: Secondary | ICD-10-CM | POA: Diagnosis not present

## 2019-06-27 DIAGNOSIS — F10939 Alcohol use, unspecified with withdrawal, unspecified: Secondary | ICD-10-CM | POA: Insufficient documentation

## 2019-06-27 LAB — CBC WITH DIFFERENTIAL/PLATELET
Abs Immature Granulocytes: 0.02 10*3/uL (ref 0.00–0.07)
Basophils Absolute: 0 10*3/uL (ref 0.0–0.1)
Basophils Relative: 1 %
Eosinophils Absolute: 0 10*3/uL (ref 0.0–0.5)
Eosinophils Relative: 0 %
HCT: 43.7 % (ref 39.0–52.0)
Hemoglobin: 14.3 g/dL (ref 13.0–17.0)
Immature Granulocytes: 0 %
Lymphocytes Relative: 22 %
Lymphs Abs: 1.4 10*3/uL (ref 0.7–4.0)
MCH: 32.2 pg (ref 26.0–34.0)
MCHC: 32.7 g/dL (ref 30.0–36.0)
MCV: 98.4 fL (ref 80.0–100.0)
Monocytes Absolute: 0.8 10*3/uL (ref 0.1–1.0)
Monocytes Relative: 13 %
Neutro Abs: 4 10*3/uL (ref 1.7–7.7)
Neutrophils Relative %: 64 %
Platelets: DECREASED 10*3/uL (ref 150–400)
RBC: 4.44 MIL/uL (ref 4.22–5.81)
RDW: 14.6 % (ref 11.5–15.5)
WBC: 6.2 10*3/uL (ref 4.0–10.5)
nRBC: 0 % (ref 0.0–0.2)

## 2019-06-27 LAB — CK: Total CK: 137 U/L (ref 49–397)

## 2019-06-27 LAB — URINALYSIS, ROUTINE W REFLEX MICROSCOPIC
Bilirubin Urine: NEGATIVE
Glucose, UA: NEGATIVE mg/dL
Ketones, ur: 5 mg/dL — AB
Nitrite: POSITIVE — AB
Protein, ur: >=300 mg/dL — AB
Specific Gravity, Urine: 1.019 (ref 1.005–1.030)
pH: 6 (ref 5.0–8.0)

## 2019-06-27 LAB — BASIC METABOLIC PANEL
Anion gap: 31 — ABNORMAL HIGH (ref 5–15)
BUN: 5 mg/dL — ABNORMAL LOW (ref 6–20)
CO2: 12 mmol/L — ABNORMAL LOW (ref 22–32)
Calcium: 8.9 mg/dL (ref 8.9–10.3)
Chloride: 93 mmol/L — ABNORMAL LOW (ref 98–111)
Creatinine, Ser: 1.07 mg/dL (ref 0.61–1.24)
GFR calc Af Amer: >60 mL/min (ref >60)
GFR calc non Af Amer: >60 mL/min (ref >60)
Glucose, Bld: 130 mg/dL — ABNORMAL HIGH (ref 70–99)
Potassium: 3.1 mmol/L — ABNORMAL LOW (ref 3.5–5.1)
Sodium: 136 mmol/L (ref 135–145)

## 2019-06-27 LAB — CREATININE, SERUM
Creatinine, Ser: 0.85 mg/dL (ref 0.61–1.24)
GFR calc Af Amer: >60 mL/min (ref >60)
GFR calc non Af Amer: >60 mL/min (ref >60)

## 2019-06-27 LAB — HEPATIC FUNCTION PANEL
ALT: 76 U/L — ABNORMAL HIGH (ref 0–44)
AST: 265 U/L — ABNORMAL HIGH (ref 15–41)
Albumin: 4.5 g/dL (ref 3.5–5.0)
Alkaline Phosphatase: 93 U/L (ref 38–126)
Bilirubin, Direct: 0.3 mg/dL — ABNORMAL HIGH (ref 0.0–0.2)
Indirect Bilirubin: 0.7 mg/dL (ref 0.3–0.9)
Total Bilirubin: 1 mg/dL (ref 0.3–1.2)
Total Protein: 7.5 g/dL (ref 6.5–8.1)

## 2019-06-27 LAB — POCT I-STAT EG7
Acid-base deficit: 10 mmol/L — ABNORMAL HIGH (ref 0.0–2.0)
Bicarbonate: 15.6 mmol/L — ABNORMAL LOW (ref 20.0–28.0)
Calcium, Ion: 1.03 mmol/L — ABNORMAL LOW (ref 1.15–1.40)
HCT: 47 % (ref 39.0–52.0)
Hemoglobin: 16 g/dL (ref 13.0–17.0)
O2 Saturation: 100 %
Potassium: 3.1 mmol/L — ABNORMAL LOW (ref 3.5–5.1)
Sodium: 134 mmol/L — ABNORMAL LOW (ref 135–145)
TCO2: 17 mmol/L — ABNORMAL LOW (ref 22–32)
pCO2, Ven: 34.7 mmHg — ABNORMAL LOW (ref 44.0–60.0)
pH, Ven: 7.26 (ref 7.250–7.430)
pO2, Ven: 211 mmHg — ABNORMAL HIGH (ref 32.0–45.0)

## 2019-06-27 LAB — ETHANOL: Alcohol, Ethyl (B): <10 mg/dL (ref <10)

## 2019-06-27 LAB — CBC
HCT: 37.5 % — ABNORMAL LOW (ref 39.0–52.0)
Hemoglobin: 12.9 g/dL — ABNORMAL LOW (ref 13.0–17.0)
MCH: 32 pg (ref 26.0–34.0)
MCHC: 34.4 g/dL (ref 30.0–36.0)
MCV: 93.1 fL (ref 80.0–100.0)
Platelets: 65 10*3/uL — ABNORMAL LOW (ref 150–400)
RBC: 4.03 MIL/uL — ABNORMAL LOW (ref 4.22–5.81)
RDW: 14.5 % (ref 11.5–15.5)
WBC: 4.7 10*3/uL (ref 4.0–10.5)
nRBC: 0 % (ref 0.0–0.2)

## 2019-06-27 LAB — RAPID URINE DRUG SCREEN, HOSP PERFORMED
Amphetamines: NOT DETECTED
Barbiturates: NOT DETECTED
Benzodiazepines: NOT DETECTED
Cocaine: NOT DETECTED
Opiates: NOT DETECTED
Tetrahydrocannabinol: POSITIVE — AB

## 2019-06-27 LAB — MRSA PCR SCREENING: MRSA by PCR: NEGATIVE

## 2019-06-27 LAB — LACTIC ACID, PLASMA
Lactic Acid, Venous: >11 mmol/L (ref 0.5–1.9)
Lactic Acid, Venous: 3.1 mmol/L (ref 0.5–1.9)
Lactic Acid, Venous: 1.7 mmol/L (ref 0.5–1.9)

## 2019-06-27 LAB — MAGNESIUM: Magnesium: 1.1 mg/dL — ABNORMAL LOW (ref 1.7–2.4)

## 2019-06-27 LAB — CBG MONITORING, ED: Glucose-Capillary: 119 mg/dL — ABNORMAL HIGH (ref 70–99)

## 2019-06-27 LAB — SALICYLATE LEVEL: Salicylate Lvl: <7 mg/dL (ref 2.8–30.0)

## 2019-06-27 LAB — ACETAMINOPHEN LEVEL: Acetaminophen (Tylenol), Serum: <10 ug/mL — ABNORMAL LOW (ref 10–30)

## 2019-06-27 LAB — PHOSPHORUS: Phosphorus: 2.8 mg/dL (ref 2.5–4.6)

## 2019-06-27 LAB — PROTIME-INR
INR: 1.1 (ref 0.8–1.2)
Prothrombin Time: 14.2 seconds (ref 11.4–15.2)

## 2019-06-27 LAB — OSMOLALITY: Osmolality: 273 mOsm/kg — ABNORMAL LOW (ref 275–295)

## 2019-06-27 LAB — PROCALCITONIN: Procalcitonin: <0.1 ng/mL

## 2019-06-27 LAB — SARS CORONAVIRUS 2 BY RT PCR (HOSPITAL ORDER, PERFORMED IN ~~LOC~~ HOSPITAL LAB): SARS Coronavirus 2: NEGATIVE

## 2019-06-27 LAB — AMMONIA: Ammonia: 125 umol/L — ABNORMAL HIGH (ref 9–35)

## 2019-06-27 MED ORDER — RIFAXIMIN 550 MG PO TABS
550.0000 mg | ORAL_TABLET | Freq: Two times a day (BID) | ORAL | Status: DC
  Administered 2019-06-27 – 2019-06-29 (×4): 550 mg via ORAL
  Filled 2019-06-27 (×4): qty 1

## 2019-06-27 MED ORDER — PANTOPRAZOLE SODIUM 40 MG IV SOLR
40.0000 mg | Freq: Every day | INTRAVENOUS | Status: DC
  Administered 2019-06-27: 22:00:00 40 mg via INTRAVENOUS
  Filled 2019-06-27: qty 40

## 2019-06-27 MED ORDER — SODIUM CHLORIDE 0.9 % IV BOLUS
30.0000 mL/kg | Freq: Once | INTRAVENOUS | Status: AC
  Administered 2019-06-27: 18:00:00 2625 mL via INTRAVENOUS

## 2019-06-27 MED ORDER — SODIUM CHLORIDE 0.9 % IV SOLN
1000.0000 mL | INTRAVENOUS | Status: DC
  Administered 2019-06-27: 1000 mL via INTRAVENOUS

## 2019-06-27 MED ORDER — HEPARIN SODIUM (PORCINE) 5000 UNIT/ML IJ SOLN
5000.0000 [IU] | Freq: Three times a day (TID) | INTRAMUSCULAR | Status: DC
  Administered 2019-06-27 – 2019-06-28 (×2): 5000 [IU] via SUBCUTANEOUS
  Filled 2019-06-27 (×2): qty 1

## 2019-06-27 MED ORDER — SODIUM CHLORIDE 0.9 % IV SOLN
INTRAVENOUS | Status: DC
  Administered 2019-06-27 – 2019-06-28 (×2): via INTRAVENOUS

## 2019-06-27 MED ORDER — SODIUM CHLORIDE 0.9 % IV SOLN
2000.0000 mg | Freq: Once | INTRAVENOUS | Status: AC
  Administered 2019-06-27: 20:00:00 2000 mg via INTRAVENOUS
  Filled 2019-06-27: qty 20

## 2019-06-27 MED ORDER — LORAZEPAM 2 MG/ML IJ SOLN
0.5000 mg/h | INTRAVENOUS | Status: DC
  Administered 2019-06-27: 0.5 mg/h via INTRAVENOUS
  Filled 2019-06-27 (×3): qty 25

## 2019-06-27 MED ORDER — SODIUM CHLORIDE 0.9 % IV BOLUS (SEPSIS)
500.0000 mL | Freq: Once | INTRAVENOUS | Status: AC
  Administered 2019-06-27: 500 mL via INTRAVENOUS

## 2019-06-27 MED ORDER — SODIUM CHLORIDE 0.9 % IV SOLN
INTRAVENOUS | Status: DC
  Administered 2019-06-27: 20:00:00 via INTRAVENOUS

## 2019-06-27 MED ORDER — MAGNESIUM SULFATE IN D5W 1-5 GM/100ML-% IV SOLN
1.0000 g | Freq: Once | INTRAVENOUS | Status: AC
  Administered 2019-06-27: 23:00:00 1 g via INTRAVENOUS
  Filled 2019-06-27: qty 100

## 2019-06-27 MED ORDER — SODIUM CHLORIDE 0.9 % IV SOLN: 1000.0000 mL | INTRAVENOUS | Status: DC

## 2019-06-27 MED ORDER — LORAZEPAM 2 MG/ML IJ SOLN
2.0000 mg | Freq: Once | INTRAMUSCULAR | Status: AC
  Administered 2019-06-27: 19:00:00 2 mg via INTRAVENOUS
  Filled 2019-06-27: qty 1

## 2019-06-27 MED ORDER — LACTULOSE 10 GM/15ML PO SOLN
30.0000 g | Freq: Two times a day (BID) | ORAL | Status: DC
  Administered 2019-06-27 – 2019-06-29 (×4): 30 g via ORAL
  Filled 2019-06-27 (×4): qty 45

## 2019-06-27 MED ORDER — CHLORHEXIDINE GLUCONATE CLOTH 2 % EX PADS
6.0000 | MEDICATED_PAD | Freq: Every day | CUTANEOUS | Status: DC
  Administered 2019-06-28: 15:00:00 6 via TOPICAL

## 2019-06-27 MED ORDER — RIFAXIMIN 200 MG PO TABS: 200.0000 mg | ORAL_TABLET | Freq: Two times a day (BID) | ORAL | Status: DC

## 2019-06-27 MED ORDER — ALBUTEROL SULFATE (2.5 MG/3ML) 0.083% IN NEBU: 2.5000 mg | INHALATION_SOLUTION | RESPIRATORY_TRACT | Status: DC | PRN

## 2019-06-27 MED ORDER — ONDANSETRON HCL 4 MG/2ML IJ SOLN: 4.0000 mg | Freq: Four times a day (QID) | INTRAMUSCULAR | Status: DC | PRN

## 2019-06-27 MED ORDER — MAGNESIUM SULFATE 2 GM/50ML IV SOLN
2.0000 g | Freq: Once | INTRAVENOUS | Status: AC
  Administered 2019-06-27: 2 g via INTRAVENOUS
  Filled 2019-06-27: qty 50

## 2019-06-27 MED ORDER — LORAZEPAM 2 MG/ML IJ SOLN
1.0000 mg | Freq: Once | INTRAMUSCULAR | Status: AC | PRN
  Administered 2019-06-27: 1 mg via INTRAVENOUS
  Filled 2019-06-27: qty 1

## 2019-06-27 MED ORDER — POTASSIUM CHLORIDE 10 MEQ/100ML IV SOLN
10.0000 meq | INTRAVENOUS | Status: AC
  Administered 2019-06-27 (×3): 10 meq via INTRAVENOUS
  Filled 2019-06-27 (×3): qty 100

## 2019-06-27 MED ORDER — LEVETIRACETAM IN NACL 500 MG/100ML IV SOLN
500.0000 mg | Freq: Two times a day (BID) | INTRAVENOUS | Status: DC
  Administered 2019-06-28 – 2019-06-29 (×3): 500 mg via INTRAVENOUS
  Filled 2019-06-27 (×4): qty 100

## 2019-06-27 NOTE — H&P (Addendum)
NAME:  [name], MRN:  [id number], DOB:  1961/10/23, LOS: 0 ADMISSION DATE:  06/27/2019, CONSULTATION DATE:  06/27/19 REFERRING MD:  Dr. Donnald GarrePfeiffer, CHIEF COMPLAINT:  Seizure   Brief History   58 y/o M who presented with multiple seizures, elevated lactate.  Hx of ETOH abuse.  ETOH negative on admit.   History of present illness   58 y/o M who [facility] with reports of multiple seizures at home.  The patient was brought in by EMS.  He reported that he thought he had 5 seizures today prior to presentation.  His friends witnessed a seizure lasting approximately 2 minutes.  On arrival to ER, he was awake / alert but suffered another seizure while in ER during registration.  He denies history of seizures.    Initial ER evaluation notable for postictal state.  He was treated with IV ativan for seizure and loaded with keppra.  Labs - Na 136, K 3.1, Cl 93, CO2 12, glucose 130, BUN 5, Cr 1.07, AG 31, phos 2.8, Mg 1.1, albumin 4.5, AST 265, ALT 76, ammonia 125, CK 137, lactic acid > 11, WBC 6.2, Hgb 14.3, platelets clumped. UA with small Hgb, 5 ketones, nitrite positive, protein > 300, 21-50 WBC.  ETOH <10, Salicylate <7. CT of the head was screened and negative for acute process.    PCCM consulted for evaluation.   Pt reports he felt poorly this am but prior to that was in his usual state of health.  He ate lunch with friends and had two shots of liquor at lunch.  He reports he was aware of the "shaking" when it occurred but does not remember transport to the hospital.    Past Medical History  ETOH Abuse  Cardiomyopathy - LVEF 55-60%, grade 1 diastolic dysfunction, 09/2017 Anxiety  Appendectomy  Hernia Repair  Significant Hospital Events   8/02 Admit with seizure, suspected ETOH withdrawal   Consults:  PCCM  Neurology   Procedures:    Significant Diagnostic Tests:  CT Head 8/2 >> no acute intracranial abnormality  EEG 8//2 >>   Micro Data:  COVID  Antimicrobials:       Interim history/subjective:  Pt reports he feels "back to normal".    Objective   Blood pressure (!) 113/94, pulse (!) 130, temperature 98.9 F (37.2 C), temperature source Oral, resp. rate 19, weight 87.5 kg, SpO2 96 %.        Intake/Output Summary (Last 24 hours) at 06/27/2019 1753 Last data filed at 06/27/2019 1708 Gross per 24 hour  Intake 500 ml  Output -  Net 500 ml   Filed Weights   06/27/19 1710  Weight: 87.5 kg    Examination: General:  Adult male lying on ER stretcher in NAD HEENT: MM pink/moist, no jvd, carotid impulse noted, no bruits Neuro: AAOx4, speech clear, MAE, normal strength  CV: s1s2 RRR, no m/r/g PULM:  Even/non-labored on RA, lungs bilaterally clear  GI: soft, bsx4 active  Extremities: warm/dry, no edema  Skin: no rashes or lesions  Resolved Hospital Problem list      Assessment & Plan:   Seizures / Status Epilepticus  P: Admit to ICU for observation  PRN ativan for seizure  Keppra load, then BID dosing  Neurology consulted per EDP Follow serial neuro exams  Assess CT head  Seizure precautions   Acute Metabolic Encephalopathy  -in setting of seizures P: Supportive care  OOB to chair as tolerated   Lactic  Acidosis  -no evidence of infection P: Trend lactate  Gentle IVF, NS at 50 ml/hr  Hypokalemia  Hypomagnesemia  P: Additional 1gm Mg+ now  KCL 40 mEq IV now  Follow BMP Replace electrolytes as indicated   Elevated LFT's  Hyperammonemia  -INR normal  P: Trend LFT's  Avoid hepatotoxic agents  Trend ammonia  Lactulose, xifaxin Assess acute hepatitis panel  Best practice:  Diet: NPO  Pain/Anxiety/Delirium protocol (if indicated): n/a VAP protocol (if indicated): n/a  DVT prophylaxis: Heparin GI prophylaxis: Protonix  Glucose control: n/a  Mobility: As tolerated  Code Status: Full Code  Family Communication: Patient updated on plan of care.  Disposition:   Labs   CBC: Recent Labs  Lab 06/27/19 1607  06/27/19 1628  WBC 6.2  --   NEUTROABS 4.0  --   HGB 14.3 16.0  HCT 43.7 47.0  MCV 98.4  --   PLT PLATELET CLUMPS NOTED ON SMEAR, COUNT APPEARS DECREASED  --     Basic Metabolic Panel: Recent Labs  Lab 06/27/19 1607 06/27/19 1628  NA 136 134*  K 3.1* 3.1*  CL 93*  --   CO2 12*  --   GLUCOSE 130*  --   BUN 5*  --   CREATININE 1.07  --   CALCIUM 8.9  --   MG 1.1*  --   PHOS 2.8  --    GFR: Estimated Creatinine Clearance: 82.9 mL/min (by C-G formula based on SCr of 1.07 mg/dL). Recent Labs  Lab 06/27/19 1607  WBC 6.2  LATICACIDVEN >11.0*    Liver Function Tests: Recent Labs  Lab 06/27/19 1607  AST 265*  ALT 76*  ALKPHOS 93  BILITOT 1.0  PROT 7.5  ALBUMIN 4.5   No results for input(s): LIPASE, AMYLASE in the last 168 hours. Recent Labs  Lab 06/27/19 1607  AMMONIA 125*    ABG    Component Value Date/Time   HCO3 15.6 (L) 06/27/2019 1628   TCO2 17 (L) 06/27/2019 1628   ACIDBASEDEF 10.0 (H) 06/27/2019 1628   O2SAT 100.0 06/27/2019 1628     Coagulation Profile: Recent Labs  Lab 06/27/19 1607  INR 1.1    Cardiac Enzymes: Recent Labs  Lab 06/27/19 1607  CKTOTAL 137    HbA1C: Hgb A1c MFr Bld  Date/Time Value Ref Range Status  04/27/2019 09:57 AM 5.2 4.6 - 6.5 % Final    Comment:    Glycemic Control Guidelines for People with Diabetes:Non Diabetic:  <6%Goal of Therapy: <7%Additional Action Suggested:  >8%   11/14/2017 03:23 AM 5.5 4.8 - 5.6 % Final    Comment:    (NOTE) Pre diabetes:          5.7%-6.4% Diabetes:              >6.4% Glycemic control for   <7.0% adults with diabetes     CBG: Recent Labs  Lab 06/27/19 1558  GLUCAP 119*    Review of Systems: Positives in bold  Gen: Denies fever, chills, weight change, fatigue, night sweats HEENT: Denies blurred vision, double vision, hearing loss, tinnitus, sinus congestion, rhinorrhea, sore throat, neck stiffness, dysphagia PULM: Denies shortness of breath, cough, sputum production,  hemoptysis, wheezing CV: Denies chest pain, edema, orthopnea, paroxysmal nocturnal dyspnea, palpitations GI: Denies abdominal pain, nausea, vomiting, diarrhea, hematochezia, melena, constipation, change in bowel habits GU: Denies dysuria, hematuria, polyuria, oliguria, urethral discharge Endocrine: Denies hot or cold intolerance, polyuria, polyphagia or appetite change Derm: Denies rash, dry skin, scaling or peeling  skin change Heme: Denies easy bruising, bleeding, bleeding gums Neuro: Denies headache, numbness, weakness, slurred speech, loss of memory or consciousness.  New seizures 8/2   Past Medical History  He,  has a past medical history of Alcohol abuse, Anxiety, and Cardiomyopathy (HCC).   Surgical History    Past Surgical History:  Procedure Laterality Date  . APPENDECTOMY    . HERNIA REPAIR    . 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 TOTAL HIP ARTHROPLASTY Right 11/12/2017   Procedure: RIGHT TOTAL HIP ARTHROPLASTY ANTERIOR APPROACH;  Surgeon: Tarry KosXu, Naiping M, MD;  Location: [facility];  Service: Orthopedics;  Laterality: Right;     Social History   reports that he has been smoking cigarettes. He has a 1.00 pack-year smoking history. He has never used smokeless tobacco. He reports current alcohol use. He reports that he does not use drugs.   Family History   His family history includes Colon cancer in his brother; Diabetes in his paternal uncle; Heart failure in his brother; Liver cancer in his brother. There is no history of Stroke, Learning disabilities, Hypertension, or Heart disease.   Allergies No Known Allergies   Home Medications  Prior to Admission medications   Medication Sig Start Date End Date Taking? Authorizing Provider  aspirin EC 325 MG tablet Take 1 tablet (325 mg total) by mouth 2 (two) times daily. 11/12/17   Tarry KosXu, Naiping M, MD   citalopram (CELEXA) 20 MG tablet Take 0.5 tablets (10 mg total) by mouth at bedtime. 05/20/19   [name], [name], MD  ondansetron (ZOFRAN) 4 MG tablet Take 1-2 tablets (4-8 mg total) by mouth every 8 (eight) hours as needed for nausea or vomiting. 11/12/17   Tarry KosXu, Naiping M, MD  oxyCODONE-acetaminophen (PERCOCET) 7.5-325 MG tablet Take 1 tablet by mouth every 4 (four) hours as needed for severe pain. 05/20/19   [name], [name], MD  promethazine (PHENERGAN) 25 MG tablet Take 1 tablet (25 mg total) by mouth every 6 (six) hours as needed for nausea. 11/12/17   Tarry KosXu, Naiping M, MD  senna-docusate (SENOKOT S) 8.6-50 MG tablet Take 1 tablet by mouth at bedtime as needed. 11/12/17   Tarry KosXu, Naiping M, MD  tiZANidine (ZANAFLEX) 4 MG tablet Take 1 tablet (4 mg total) by mouth every 6 (six) hours as needed for muscle spasms. 11/12/17   Tarry KosXu, Naiping M, MD  traMADol (ULTRAM) 50 MG tablet Take 1-2 tablets (50-100 mg total) by mouth 3 (three) times daily as needed. 09/12/17   Tarry KosXu, Naiping M, MD  Vitamin D, Ergocalciferol, (DRISDOL) 1.25 MG (50000 UT) CAPS capsule Take 1 capsule (50,000 Units total) by mouth every 7 (seven) days. 04/27/19   [name], [name], MD  zolpidem (AMBIEN) 10 MG tablet Take 1 tablet (10 mg total) by mouth at bedtime as needed for up to 30 days for sleep. 05/20/19 06/19/19  [name], [name], MD     Critical care time: 30 minutes    Canary BrimBrandi Abrish Erny, NP-[facility] Pgr: [phone] or if no answer [phone] 06/27/2019, 5:53 PM

## 2019-06-27 NOTE — ED Notes (Signed)
Patient transported to CT 

## 2019-06-27 NOTE — Procedures (Signed)
Patient Name: [name]  MRN: [id number]  Epilepsy Attending: Lora Havens  Referring Physician/Provider: Dr Vassie Moment Date: 06/27/19 Duration: 26.38 mins  Patient history: 58yo M with h/o alcohol use who presented with seizure. EEG to evaluate for seizure  Level of alertness: awake, sleep  AEDs during EEG study: LEV, ativan  Technical aspects: This EEG study was done with scalp electrodes positioned according to the 10-20 International system of electrode placement. Electrical activity was acquired at a sampling rate of 500Hz  and reviewed with a high frequency filter of 70Hz  and a low frequency filter of 1Hz . EEG data were recorded continuously and digitally stored.   DESCRIPTION:  The posterior dominant rhythm consists of 7 Hz activity of moderate voltage (25-35 uV) seen predominantly in posterior head regions, symmetric and reactive to eye opening and eye closing. Sleep was characterized by sleep spindles ( 12-14hz ) and vertex waves. Continuous generalized 5-6 hz theta slowing was also seen. Hyperventilation and photic stimulation were not performed.  IMPRESSION: This study is suggestive of mild diffuse encephalopathy. No seizures or epileptiform discharges were seen throughout the recording.    Venisha Boehning Barbra Sarks

## 2019-06-27 NOTE — ED Triage Notes (Signed)
Pt here via [place]. Pt thinks he has had 5 seizures today, friends witnessed a grand mal seizure during lunch today lasting 2 minutes.  When EMS arrived pt was A&O x4, when pt arrived at the registration desk they witnessed another grand mal seizure. Pt is currently A&O x 4 and is postictal.  Denies hx of seizures.

## 2019-06-27 NOTE — ED Provider Notes (Signed)
[facility] Provider Note   CSN: [id number] Arrival date & time: 06/27/19  1553    History   Chief Complaint No chief complaint on file.   HPI [name] is a 58 y.o. male history of alcohol abuse, cardiomyopathy, hypertension presents today via EMS after seizure.  History obtained via EMS, patient with apparently 5 seizures today, initially witnessed by friends who activated EMS.  On EMS arrival patient fully alert and oriented and without complaint, he was brought into the ER and suffered a generalized seizure while being registered.  Upon my initial evaluation patient is no longer seizing he is resting comfortably in bed somewhat fatigued appearing and in no acute distress.  Alert to self place and time, he is unsure about event appears somewhat postictal.  He denies any history of seizures, injury/pain, recent infectious-like symptoms or any additional concerns.  No medication given by EMS to arrival.    HPI  Past Medical History:  Diagnosis Date  . Alcohol abuse    in the past  . Anxiety   . Cardiomyopathy (HCC)    hx of    Patient Active Problem List   Diagnosis Date Noted  . Status epilepticus (HCC) 06/27/2019  . Insomnia 05/23/2019  . Low back pain 04/27/2019  . Recurrent falls 04/27/2019  . Grief 04/27/2019  . Hyperglycemia 04/27/2019  . B12 deficiency 04/27/2019  . Vitamin D deficiency 04/27/2019  . Right hip pain 12/04/2017  . 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 Hip joint replacement status   . Preop exam for internal medicine 10/11/2017  . Pyuria 10/08/2017  . 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 Hypersomnolence 02/17/2015  . Syncope 02/16/2015  . Arthritis of right hip 09/05/2014  . Essential hypertension, benign 07/24/2014  . Encounter for well adult exam with abnormal findings 07/22/2014  . Anxiety and depression 07/22/2014  . Alcohol abuse counseling and surveillance 06/25/2014  . Left  hip pain 05/07/2013    Past Surgical History:  Procedure Laterality Date  . APPENDECTOMY    . HERNIA REPAIR    . 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 TOTAL HIP ARTHROPLASTY Right 11/12/2017   Procedure: RIGHT TOTAL HIP ARTHROPLASTY ANTERIOR APPROACH;  Surgeon: Tarry KosXu, Naiping M, MD;  Location: [facility] OR;  Service: Orthopedics;  Laterality: Right;        Home Medications    Prior to Admission medications   Medication Sig Start Date End Date Taking? Authorizing Provider  acetaminophen (TYLENOL) 500 MG tablet Take 500-1,000 mg by mouth every 6 (six) hours as needed (for pain).   Yes [provider]  aspirin EC 325 MG tablet Take 1 tablet (325 mg total) by mouth 2 (two) times daily. Patient taking differently: Take 325 mg by mouth 4 (four) times a week.  11/12/17  Yes Tarry KosXu, Naiping M, MD  citalopram (CELEXA) 20 MG tablet Take 0.5 tablets (10 mg total) by mouth at bedtime. 05/20/19  Yes [name], [name], MD  zolpidem (AMBIEN) 10 MG tablet Take 1 tablet (10 mg total) by mouth at bedtime as needed for up to 30 days for sleep. 05/20/19 06/27/19 Yes [name], [name], MD  ondansetron (ZOFRAN) 4 MG tablet Take 1-2 tablets (4-8 mg total) by mouth every 8 (eight) hours as needed for nausea or vomiting. Patient not taking: Reported on 06/27/2019  11/12/17   Tarry Kos, MD  oxyCODONE-acetaminophen (PERCOCET) 7.5-325 MG tablet Take 1 tablet by mouth every 4 (four) hours as needed for severe pain. Patient not taking: Reported on 06/27/2019 05/20/19   [name], MD  promethazine (PHENERGAN) 25 MG tablet Take 1 tablet (25 mg total) by mouth every 6 (six) hours as needed for nausea. Patient not taking: Reported on 06/27/2019 11/12/17   Tarry Kos, MD  senna-docusate (SENOKOT S) 8.6-50 MG tablet Take 1 tablet by mouth at bedtime as needed. Patient not taking: Reported on  06/27/2019 11/12/17   Tarry Kos, MD  tiZANidine (ZANAFLEX) 4 MG tablet Take 1 tablet (4 mg total) by mouth every 6 (six) hours as needed for muscle spasms. Patient not taking: Reported on 06/27/2019 11/12/17   Tarry Kos, MD  traMADol (ULTRAM) 50 MG tablet Take 1-2 tablets (50-100 mg total) by mouth 3 (three) times daily as needed. Patient not taking: Reported on 06/27/2019 09/12/17   Tarry Kos, MD  Vitamin D, Ergocalciferol, (DRISDOL) 1.25 MG (50000 UT) CAPS capsule Take 1 capsule (50,000 Units total) by mouth every 7 (seven) days. Patient not taking: Reported on 06/27/2019 04/27/19   [name], MD    Family History Family History  Problem Relation Age of Onset  . Colon cancer Brother   . Heart failure Brother   . Liver cancer Brother   . Diabetes Paternal Uncle   . Stroke Neg Hx   . Learning disabilities Neg Hx   . Hypertension Neg Hx   . Heart disease Neg Hx     Social History Social History   Tobacco Use  . Smoking status: Current Every Day Smoker    Packs/day: 0.25    Years: 4.00    Pack years: 1.00    Types: Cigarettes  . Smokeless tobacco: Never Used  . Tobacco comment: 4-5 a day  Substance Use Topics  . Alcohol use: Yes    Comment: 2-3 shots a day  . Drug use: No     Allergies   Patient has no known allergies.   Review of Systems Review of Systems  Unable to perform ROS: Mental status change (Postictal)     Physical Exam Updated Vital Signs BP 103/80   Pulse (!) 120   Temp 98.9 F (37.2 C) (Oral)   Resp 19   Wt 87.5 kg   SpO2 95%   BMI 29.33 kg/m   Physical Exam Constitutional:      General: He is not in acute distress.    Appearance: Normal appearance. He is well-developed. He is not ill-appearing or diaphoretic.     Comments: Tired appearing  HENT:     Head: Normocephalic and atraumatic.     Right Ear: External ear normal.     Left Ear: External ear normal.     Nose: Nose normal.  Eyes:     General: Vision grossly intact. Gaze  aligned appropriately.     Pupils: Pupils are equal, round, and reactive to light.  Neck:     Musculoskeletal: Normal range of motion.     Trachea: Trachea and phonation normal. No tracheal deviation.  Cardiovascular:     Rate and Rhythm: Regular rhythm. Tachycardia present.     Pulses: Normal pulses.  Pulmonary:     Effort: Pulmonary effort is normal. No respiratory distress.  Abdominal:     General: There is no distension.     Palpations: Abdomen is soft.  Tenderness: There is no abdominal tenderness. There is no guarding or rebound.  Musculoskeletal: Normal range of motion.        General: No deformity.  Skin:    General: Skin is warm and dry.  Neurological:     Mental Status: He is alert.     GCS: GCS eye subscore is 4. GCS verbal subscore is 5. GCS motor subscore is 6.     Comments: Speech is clear and goal oriented, follows commands Major Cranial nerves without deficit, no facial droop Normal strength in upper and lower extremities bilaterally including dorsiflexion and plantar flexion, strong and equal grip strength Sensation normal to light touch Moves extremities without ataxia, coordination intact  Psychiatric:        Behavior: Behavior normal.     ED Treatments / Results  Labs (all labs ordered are listed, but only abnormal results are displayed) Labs Reviewed  BASIC METABOLIC PANEL - Abnormal; Notable for the following components:      Result Value   Potassium 3.1 (*)    Chloride 93 (*)    CO2 12 (*)    Glucose, Bld 130 (*)    BUN 5 (*)    Anion gap 31 (*)    All other components within normal limits  MAGNESIUM - Abnormal; Notable for the following components:   Magnesium 1.1 (*)    All other components within normal limits  ACETAMINOPHEN LEVEL - Abnormal; Notable for the following components:   Acetaminophen (Tylenol), Serum <10 (*)    All other components within normal limits  RAPID URINE DRUG SCREEN, HOSP PERFORMED - Abnormal; Notable for the  following components:   Tetrahydrocannabinol POSITIVE (*)    All other components within normal limits  URINALYSIS, ROUTINE W REFLEX MICROSCOPIC - Abnormal; Notable for the following components:   Color, Urine AMBER (*)    APPearance HAZY (*)    Hgb urine dipstick SMALL (*)    Ketones, ur 5 (*)    Protein, ur >=300 (*)    Nitrite POSITIVE (*)    Leukocytes,Ua TRACE (*)    Bacteria, UA MANY (*)    All other components within normal limits  HEPATIC FUNCTION PANEL - Abnormal; Notable for the following components:   AST 265 (*)    ALT 76 (*)    Bilirubin, Direct 0.3 (*)    All other components within normal limits  AMMONIA - Abnormal; Notable for the following components:   Ammonia 125 (*)    All other components within normal limits  LACTIC ACID, PLASMA - Abnormal; Notable for the following components:   Lactic Acid, Venous >11.0 (*)    All other components within normal limits  CBG MONITORING, ED - Abnormal; Notable for the following components:   Glucose-Capillary 119 (*)    All other components within normal limits  POCT I-STAT EG7 - Abnormal; Notable for the following components:   pCO2, Ven 34.7 (*)    pO2, Ven 211.0 (*)    Bicarbonate 15.6 (*)    TCO2 17 (*)    Acid-base deficit 10.0 (*)    Sodium 134 (*)    Potassium 3.1 (*)    Calcium, Ion 1.03 (*)    All other components within normal limits  SARS CORONAVIRUS 2 (HOSPITAL ORDER, PERFORMED IN [facility])  CBC WITH DIFFERENTIAL/PLATELET  PHOSPHORUS  ETHANOL  SALICYLATE LEVEL  PROTIME-INR  CK  BLOOD GAS, VENOUS  OSMOLALITY  OSMOLALITY, URINE  HIV ANTIBODY (ROUTINE TESTING W REFLEX)  CBC  CREATININE, SERUM  LACTIC ACID, PLASMA  LACTIC ACID, PLASMA  PROCALCITONIN  CBC  BASIC METABOLIC PANEL  MAGNESIUM  PHOSPHORUS  AMMONIA    EKG None  Radiology Ct Head Wo Contrast  Result Date: 06/27/2019 CLINICAL DATA:  Seizure.  Currently postictal EXAM: CT HEAD WITHOUT CONTRAST TECHNIQUE: Contiguous  axial images were obtained from the base of the skull through the vertex without intravenous contrast. COMPARISON:  11/13/2017 FINDINGS: Brain: No evidence of acute infarction, hemorrhage, hydrocephalus, extra-axial collection or mass lesion/mass effect. Vascular: No hyperdense vessel or unexpected calcification. Skull: Normal. Negative for fracture or focal lesion. Sinuses/Orbits: Bilateral maxillary sinus mucosal thickening. Other: None. IMPRESSION: 1. No acute intracranial abnormality.  Normal brain. Electronically Signed   By: Signa Kellaylor  Stroud M.D.   On: 06/27/2019 17:42   Dg Chest Portable 1 View  Result Date: 06/27/2019 CLINICAL DATA:  Seizure EXAM: PORTABLE CHEST 1 VIEW COMPARISON:  October 07, 2013 FINDINGS: No edema or consolidation. Heart is upper normal in size with pulmonary vascularity normal. No adenopathy. No pneumothorax. No bone lesions. IMPRESSION: No edema or consolidation.  Heart upper normal in size. Electronically Signed   By: Bretta BangWilliam  Woodruff III M.D.   On: 06/27/2019 16:28    Procedures .Critical Care Performed by: Bill SalinasMorelli, Serafina Topham A, PA-C Authorized by: Bill SalinasMorelli, Robena Ewy A, PA-C   Critical care provider statement:    Critical care time (minutes):  45   Critical care was necessary to treat or prevent imminent or life-threatening deterioration of the following conditions:  Metabolic crisis   Critical care was time spent personally by me on the following activities:  Discussions with consultants, evaluation of patient's response to treatment, examination of patient, ordering and performing treatments and interventions, ordering and review of laboratory studies, ordering and review of radiographic studies, pulse oximetry, re-evaluation of patient's condition, obtaining history from patient or surrogate, review of old charts and development of treatment plan with patient or surrogate   (including critical care time)  Medications Ordered in ED Medications  magnesium sulfate IVPB  1 g 100 mL (has no administration in time range)  potassium chloride 10 mEq in 100 mL IVPB (10 mEq Intravenous New Bag/Given 06/27/19 1900)  levETIRAcetam (KEPPRA) 2,000 mg in sodium chloride 0.9 % 100 mL IVPB (has no administration in time range)  heparin injection 5,000 Units (has no administration in time range)  0.9 %  sodium chloride infusion (has no administration in time range)  ondansetron (ZOFRAN) injection 4 mg (has no administration in time range)  pantoprazole (PROTONIX) injection 40 mg (has no administration in time range)  albuterol (PROVENTIL) (2.5 MG/3ML) 0.083% nebulizer solution 2.5 mg (has no administration in time range)  levETIRAcetam (KEPPRA) IVPB 500 mg/100 mL premix (has no administration in time range)  sodium chloride 0.9 % bolus 500 mL (0 mLs Intravenous Stopped 06/27/19 1708)    Followed by  0.9 %  sodium chloride infusion (has no administration in time range)  lactulose (CHRONULAC) 10 GM/15ML solution 30 g (has no administration in time range)  rifaximin (XIFAXAN) tablet 550 mg (has no administration in time range)  LORazepam (ATIVAN) 50 mg in dextrose 5 % 50 mL (1 mg/mL) infusion (has no administration in time range)  0.9 %  sodium chloride infusion (has no administration in time range)  LORazepam (ATIVAN) injection 1 mg (1 mg Intravenous Given 06/27/19 1611)  sodium chloride 0.9 % bolus 2,625 mL (2,625 mLs Intravenous New Bag/Given 06/27/19 1753)  magnesium sulfate IVPB 2 g 50 mL (0  g Intravenous Stopped 06/27/19 1850)  LORazepam (ATIVAN) injection 2 mg (2 mg Intravenous Given 06/27/19 1857)     Initial Impression / Assessment and Plan / ED Course  I have reviewed the triage vital signs and the nursing notes.  Pertinent labs & imaging results that were available during my care of the patient were reviewed by me and considered in my medical decision making (see chart for details).  Clinical Course as of Jun 26 1904  Sun Jun 27, 2019  1742 Dr. Lamonte Sakai CCM   [BM]  1752  Ativan drip w set rate for etoh w/d   [BM]  1752 Keppra 2k load   [BM]    Clinical Course User Index [BM] Deliah Boston, PA-C   Patient arrives postictal, tired appearing but no acute distress, tachycardic.  1 mg Ativan ordered,, large work-up begun with CT head. - I-STAT EEG 7 with bicarb 15.6, pH 7.26, O2 211, potassium 3.1, sodium 134 CBC within normal limits CK 137 Magnesium 1.1 Phosphorus 2.8 BMP with anion gap 31, creatinine within normal limits, bicarb 12 Ammonia 387 Salicylate negative Ethanol negative CBG 119 PT/INR within normal limits Acetaminophen level Lactic 11.0 - Discussed with critical care Dr. Lamonte Sakai will be seeing patient, ask for neuro consult regarding antiepileptics. - Discussed case with Dr. Cheral Marker who advises to gram Keppra load and Ativan drip.  Also advises 2 additional milligrams of Ativan immediately. - CXR:  IMPRESSION:  No edema or consolidation. Heart upper normal in size.   CT head:  IMPRESSION:  1. No acute intracranial abnormality. Normal brain.  - Reevaluation patient is resting comfortably alert and oriented x4, denies any concerns denies any pain he is overall well-appearing and in no acute distress remains tachycardic approximately 130 bpm.  He denies any substance ingestion, antifreeze ingestion, sensitizer ingestion or other drug use.  He reports that he stopped drinking alcohol 2 days ago he denies previous history of alcohol withdrawal seizures.  He states understanding of care plan and is agreeable for admission. - Patient seen and evaluated by critical care medicine team who admitted patient under their service. - UDS positive for THC Urinalysis with many bacteria, WBCs, nitrates, greater than 300 protein LFTs elevated AST 265, ALT 76  Keppra load ordered.  Additional 2 mg Ativan bolus ordered.  Ativan drip ordered.  Magnesium replacement. - Patient has been admitted to critical care medicine for further evaluation and  management.  Patient seen and evaluated by Dr. Johnney Killian during this visit.  Note: Portions of this report may have been transcribed using voice recognition software. Every effort was made to ensure accuracy; however, inadvertent computerized transcription errors may still be present. Final Clinical Impressions(s) / ED Diagnoses   Final diagnoses:  New onset seizure ([place])  Alcohol withdrawal syndrome with complication ([place])  Lactic acidosis    ED Discharge Orders    None       Gari Crown 06/27/19 Pauline Aus    Charlesetta Shanks, MD 07/12/19 1214

## 2019-06-27 NOTE — ED Provider Notes (Signed)
Medical screening examination/treatment/procedure(s) were conducted as a shared visit with non-physician practitioner(s) and myself.  I personally evaluated the patient during the encounter.    Patient reportedly had 5 seizures today.  He reports that he had a couple of seizures at home before he ever came into the emergency department.  Finally, friends called EMS.  On EMS arrival, the patient was alert and fully oriented.  He was oriented through transport and did not have any seizure activity but then immediately upon entering the emergency department had a approximately 30 second seizure.  Patient is denying any prior seizure history.  He does have a known alcohol history.  Patient stopped drinking 2 days ago.  He denies he is ever had alcohol withdrawal seizures in the past.  Patient denies he is recently felt ill in terms of fevers, chills, cough.  He is denying a headache.  Patient is pale in appearance.  He is alert at this time.  Monitor shows tachycardia in the 130s sinus rhythm.  He does not have respiratory distress.  Tachycardia.  Lungs grossly clear.  Abdomen soft without guarding.  I agree with plan of management.   Charlesetta Shanks, MD 07/12/19 1214

## 2019-06-27 NOTE — Consult Note (Addendum)
Neurology Consultation  Reason for Consult: Seizures Referring Physician: Dr. Clarice PolePfeifer, Dr. Delton CoombesByrum  CC: Seizures  History is obtained from: Chart review, EDP  HPI: [name] is a 58 y.o. male past medical history of significant alcohol abuse who recently cut down on drinking, brought in to the emergency room after having 3-4 episodes of presumed seizure activity at home. He had one episode of generalized tonic-clonic seizure on arrival to the hospital not witnessed by the EDP but at the bridge while he was being checked in. The patient says that he remembers having these episodes.  He says he had for 5 episodes of seizures yesterday but he remembers them.  He could not control shaking in all his extremities.  He appeared to be confused on arrival but has cleared in terms of his mentation since some. He was able to cooperate with the exam but I am not sure if all the history provided by him is reliable at this time. He says he lives by himself with friends and family living close by. He was able to tell me his address.  He was able to tell me his age, date of birth and where he is right now. His attention concentration seem to be poor. He had a CT scan of the head done which was negative for an acute process. He had significant lactic acidosis but no other infectious cause identified.  His ammonia level is significantly elevated along with deranged liver function tests.  Denies any fevers or chills.  Denies cough shortness of breath.  Denies chest pain palpitations.  Denies abdominal pain vomiting diarrhea constipation.  Denies any bleeding or bruising.  ROS: Obtained to the best of history taking and listed positives in the HPI.  Rest of the review negative.  Past Medical History:  Diagnosis Date  . Alcohol abuse    in the past  . Anxiety   . Cardiomyopathy (HCC)    hx of   Family History  Problem Relation Age of Onset  . Colon cancer Brother   . Heart failure Brother   .  Liver cancer Brother   . Diabetes Paternal Uncle   . Stroke Neg Hx   . Learning disabilities Neg Hx   . Hypertension Neg Hx   . Heart disease Neg Hx     Social History:   reports that he has been smoking cigarettes. He has a 1.00 pack-year smoking history. He has never used smokeless tobacco. He reports current alcohol use. He reports that he does not use drugs. Could not quantify how much he drinks but said he does not drink every day but drinks excessively when he drinks. Medications  Current Facility-Administered Medications:  .  0.9 %  sodium chloride infusion, , Intravenous, Continuous, Byrum, Les Pouobert S, MD .  [COMPLETED] sodium chloride 0.9 % bolus 500 mL, 500 mL, Intravenous, Once, Stopped at 06/27/19 1708 **FOLLOWED BY** 0.9 %  sodium chloride infusion, 1,000 mL, Intravenous, Continuous, Byrum, Robert S, MD .  0.9 %  sodium chloride infusion, , Intravenous, Continuous, Ollis, Brandi L, NP .  albuterol (PROVENTIL) (2.5 MG/3ML) 0.083% nebulizer solution 2.5 mg, 2.5 mg, Nebulization, Q4H PRN, Byrum, Les Pouobert S, MD .  heparin injection 5,000 Units, 5,000 Units, Subcutaneous, Q8H, Byrum, Les Pouobert S, MD .  lactulose (CHRONULAC) 10 GM/15ML solution 30 g, 30 g, Oral, BID, Byrum, Les Pouobert S, MD .  levETIRAcetam (KEPPRA) 2,000 mg in sodium chloride 0.9 % 100 mL IVPB, 2,000 mg, Intravenous, Once, Morelli, Brandon A, PA-C .  [  START ON 06/28/2019] levETIRAcetam (KEPPRA) IVPB 500 mg/100 mL premix, 500 mg, Intravenous, Q12H, Byrum, Les Pouobert S, MD .  LORazepam (ATIVAN) 50 mg in dextrose 5 % 50 mL (1 mg/mL) infusion, 0.5 mg/hr, Intravenous, Titrated, Masters, Darl HouseholderAlison M, RPH .  magnesium sulfate IVPB 1 g 100 mL, 1 g, Intravenous, Once, Ollis, Brandi L, NP .  ondansetron (ZOFRAN) injection 4 mg, 4 mg, Intravenous, Q6H PRN, Byrum, Les Pouobert S, MD .  pantoprazole (PROTONIX) injection 40 mg, 40 mg, Intravenous, QHS, Byrum, Robert S, MD .  potassium chloride 10 mEq in 100 mL IVPB, 10 mEq, Intravenous, Q1 Hr x 4, Ollis,  Brandi L, NP, Last Rate: 100 mL/hr at 06/27/19 1900, 10 mEq at 06/27/19 1900 .  rifaximin (XIFAXAN) tablet 550 mg, 550 mg, Oral, BID, Byrum, Les Pouobert S, MD  Current Outpatient Medications:  .  acetaminophen (TYLENOL) 500 MG tablet, Take 500-1,000 mg by mouth every 6 (six) hours as needed (for pain)., Disp: , Rfl:  .  aspirin EC 325 MG tablet, Take 1 tablet (325 mg total) by mouth 2 (two) times daily. (Patient taking differently: Take 325 mg by mouth 4 (four) times a week. ), Disp: 84 tablet, Rfl: 0 .  citalopram (CELEXA) 20 MG tablet, Take 0.5 tablets (10 mg total) by mouth at bedtime., Disp: 45 tablet, Rfl: 3 .  zolpidem (AMBIEN) 10 MG tablet, Take 1 tablet (10 mg total) by mouth at bedtime as needed for up to 30 days for sleep., Disp: 30 tablet, Rfl: 2 .  ondansetron (ZOFRAN) 4 MG tablet, Take 1-2 tablets (4-8 mg total) by mouth every 8 (eight) hours as needed for nausea or vomiting. (Patient not taking: Reported on 06/27/2019), Disp: 40 tablet, Rfl: 0 .  oxyCODONE-acetaminophen (PERCOCET) 7.5-325 MG tablet, Take 1 tablet by mouth every 4 (four) hours as needed for severe pain. (Patient not taking: Reported on 06/27/2019), Disp: 30 tablet, Rfl: 0 .  promethazine (PHENERGAN) 25 MG tablet, Take 1 tablet (25 mg total) by mouth every 6 (six) hours as needed for nausea. (Patient not taking: Reported on 06/27/2019), Disp: 30 tablet, Rfl: 1 .  senna-docusate (SENOKOT S) 8.6-50 MG tablet, Take 1 tablet by mouth at bedtime as needed. (Patient not taking: Reported on 06/27/2019), Disp: 30 tablet, Rfl: 1 .  tiZANidine (ZANAFLEX) 4 MG tablet, Take 1 tablet (4 mg total) by mouth every 6 (six) hours as needed for muscle spasms. (Patient not taking: Reported on 06/27/2019), Disp: 30 tablet, Rfl: 2 .  traMADol (ULTRAM) 50 MG tablet, Take 1-2 tablets (50-100 mg total) by mouth 3 (three) times daily as needed. (Patient not taking: Reported on 06/27/2019), Disp: 30 tablet, Rfl: 2 .  Vitamin D, Ergocalciferol, (DRISDOL) 1.25 MG  (50000 UT) CAPS capsule, Take 1 capsule (50,000 Units total) by mouth every 7 (seven) days. (Patient not taking: Reported on 06/27/2019), Disp: 12 capsule, Rfl: 0   Exam: Current vital signs: BP 122/90   Pulse (!) 120   Temp 98.6 F (37 C) (Oral)   Resp (!) 22   Wt 87.5 kg   SpO2 95%   BMI 29.33 kg/m  Vital signs in last 24 hours: Temp:  [98.6 F (37 C)-98.9 F (37.2 C)] 98.6 F (37 C) (08/02 1908) Pulse Rate:  [120-130] 120 (08/02 1800) Resp:  [18-23] 22 (08/02 1948) BP: (103-132)/(80-94) 122/90 (08/02 1948) SpO2:  [92 %-100 %] 95 % (08/02 1800) Weight:  [87.5 kg] 87.5 kg (08/02 1710)  GENERAL: Awake, alert in NAD HEENT: - Normocephalic and atraumatic, dry  mm, no LN++, no Thyromegally LUNGS - Clear to auscultation bilaterally with no wheezes CV - S1S2 RRR, no m/r/g, equal pulses bilaterally. ABDOMEN - Soft, nontender, nondistended with normoactive BS Ext: warm, well perfused, intact peripheral pulses, no edema  NEURO:  Mental Status: Awake alert oriented to time place and person.  Reduced attention concentration Language: speech is non-dysarthric.  Naming, repetition, fluency, and comprehension intact Cranial Nerves: PERRL EOMI, visual fields full, no facial asymmetry, facial sensation intact, hearing intact, tongue/uvula/soft palate midline, normal sternocleidomastoid and trapezius muscle strength. No evidence of tongue atrophy or fibrillations Motor: Antigravity 5/5 in all 4 extremities without drift.  No asterixis noted on outstretched arms Tone: is normal and bulk is normal Sensation- Intact to light touch bilaterally Coordination: FTN intact bilaterally Gait- deferred  Labs I have reviewed labs in epic and the results pertinent to this consultation are:   CBC    Component Value Date/Time   WBC 4.7 06/27/2019 1847   RBC 4.03 (L) 06/27/2019 1847   HGB 12.9 (L) 06/27/2019 1847   HCT 37.5 (L) 06/27/2019 1847   PLT 65 (L) 06/27/2019 1847   MCV 93.1 06/27/2019 1847    MCH 32.0 06/27/2019 1847   MCHC 34.4 06/27/2019 1847   RDW 14.5 06/27/2019 1847   LYMPHSABS 1.4 06/27/2019 1607   MONOABS 0.8 06/27/2019 1607   EOSABS 0.0 06/27/2019 1607   BASOSABS 0.0 06/27/2019 1607    CMP     Component Value Date/Time   NA 134 (L) 06/27/2019 1628   K 3.1 (L) 06/27/2019 1628   CL 93 (L) 06/27/2019 1607   CO2 12 (L) 06/27/2019 1607   GLUCOSE 130 (H) 06/27/2019 1607   BUN 5 (L) 06/27/2019 1607   CREATININE 0.85 06/27/2019 1847   CALCIUM 8.9 06/27/2019 1607   PROT 7.5 06/27/2019 1607   ALBUMIN 4.5 06/27/2019 1607   AST 265 (H) 06/27/2019 1607   ALT 76 (H) 06/27/2019 1607   ALKPHOS 93 06/27/2019 1607   BILITOT 1.0 06/27/2019 1607   GFRNONAA >60 06/27/2019 1847   GFRAA >60 06/27/2019 1847   Serum alcohol level negative from today.  Prior levels have been high on multiple occasions from 2016 till early part of 2020. Urinary toxicology screen positive for THC only. UA+ for UTI  Imaging I have reviewed the images obtained:  CT-scan of the brain shows no acute changes.  No bleed.  Assessment: 58 year old man past history of alcohol abuse, currently no alcohol level detected in blood, presenting with 3-4 episodes of presumed seizure activity at home and 1 episode of generalized tonic-clonic seizure activity in the emergency room after which she was confused and postictal and has now started to resume returning back to baseline. On my examination, other than reduced attention concentration, his exam is essentially normal. Unclear if these episodes were related to his metabolic derangements such as liver failure and hyperammonemia or alcohol withdrawal seizures. In any case, he has been started on antiepileptics He is also been started on a Ativan drip per phone consultation with the neurology MD on call during daytime.  Impression: Evaluate for seizures- precipitated by metabolic derangement versus alcohol withdrawal Evaluate for possible status  epilepticus-resolved clinically Toxic metabolic encephalopathy  Recommendations: Continue Keppra 500 twice daily Continue Ativan and alcohol withdrawal protocol Maintain seizure precautions Stat EEG has already been ordered by the neurology only during the daytime.  Will await completion and results. Treatment of hyperammonemia and liver dysfunction per primary team as you are. Management of UTI  per primary team Neurology will follow with you.  -- Amie Portland, MD [facility] Pager: [phone] If 7pm to 7am, please call on call as listed on [facility].   CRITICAL CARE ATTESTATION Performed by: Amie Portland, MD Total critical care time: 40 minutes Critical care time was exclusive of separately billable procedures and treating other patients and/or supervising APPs/Residents/Students Critical care was necessary to treat or prevent imminent or life-threatening deterioration due to possible status epilepticus, seizures, toxic metabolic encephalopathy This patient is critically ill and at significant risk for neurological worsening and/or death and care requires constant monitoring. Critical care was time spent personally by me on the following activities: development of treatment plan with patient and/or surrogate as well as nursing, discussions with consultants, evaluation of patient's response to treatment, examination of patient, obtaining history from patient or surrogate, ordering and performing treatments and interventions, ordering and review of laboratory studies, ordering and review of radiographic studies, pulse oximetry, re-evaluation of patient's condition, participation in multidisciplinary rounds and medical decision making of high complexity in the care of this patient.  ADDENDUM STAT EEG Negative for seizures. Recs as above.  -- Amie Portland, MD [facility] Pager: [phone] If 7pm to 7am, please call on call as listed on [facility].

## 2019-06-27 NOTE — Progress Notes (Signed)
eLink Physician-Brief Progress Note Patient Name: [name] DOB: Jun 16, 1961 MRN: [id number]   Date of Service  06/27/2019  HPI/Events of Note  37 M ETOH abuse being managed for DTs after presenting with seizures  eICU Interventions   On Keppra and ativan drip per neuro recs  Monitor neuro status and airway. Strict aspiration precaution  Ongoing electrolyte correction  Low threshold for initiation of antibiotics for UTI if fever develops     Intervention Category Major Interventions: Seizures - evaluation and management Evaluation Type: New Patient Evaluation  Judd Lien 06/27/2019, 9:33 PM

## 2019-06-27 NOTE — Progress Notes (Signed)
Stat EEG completed, results pending. Dr. Yadav notified. 

## 2019-06-28 ENCOUNTER — Inpatient Hospital Stay (HOSPITAL_COMMUNITY): Payer: Medicare HMO

## 2019-06-28 DIAGNOSIS — R569 Unspecified convulsions: Secondary | ICD-10-CM

## 2019-06-28 LAB — BASIC METABOLIC PANEL
Anion gap: 9 (ref 5–15)
BUN: <5 mg/dL — ABNORMAL LOW (ref 6–20)
CO2: 25 mmol/L (ref 22–32)
Calcium: 7.7 mg/dL — ABNORMAL LOW (ref 8.9–10.3)
Chloride: 102 mmol/L (ref 98–111)
Creatinine, Ser: 0.7 mg/dL (ref 0.61–1.24)
GFR calc Af Amer: >60 mL/min (ref >60)
GFR calc non Af Amer: >60 mL/min (ref >60)
Glucose, Bld: 91 mg/dL (ref 70–99)
Potassium: 3.3 mmol/L — ABNORMAL LOW (ref 3.5–5.1)
Sodium: 136 mmol/L (ref 135–145)

## 2019-06-28 LAB — HIV ANTIBODY (ROUTINE TESTING W REFLEX): HIV Screen 4th Generation wRfx: NONREACTIVE

## 2019-06-28 LAB — PHOSPHORUS: Phosphorus: 3 mg/dL (ref 2.5–4.6)

## 2019-06-28 LAB — CBC
HCT: 34.2 % — ABNORMAL LOW (ref 39.0–52.0)
Hemoglobin: 11.5 g/dL — ABNORMAL LOW (ref 13.0–17.0)
MCH: 31.9 pg (ref 26.0–34.0)
MCHC: 33.6 g/dL (ref 30.0–36.0)
MCV: 94.7 fL (ref 80.0–100.0)
Platelets: 65 10*3/uL — ABNORMAL LOW (ref 150–400)
RBC: 3.61 MIL/uL — ABNORMAL LOW (ref 4.22–5.81)
RDW: 14.5 % (ref 11.5–15.5)
WBC: 3.5 10*3/uL — ABNORMAL LOW (ref 4.0–10.5)
nRBC: 0 % (ref 0.0–0.2)

## 2019-06-28 LAB — MAGNESIUM: Magnesium: 1.7 mg/dL (ref 1.7–2.4)

## 2019-06-28 LAB — AMMONIA: Ammonia: 63 umol/L — ABNORMAL HIGH (ref 9–35)

## 2019-06-28 MED ORDER — POTASSIUM CHLORIDE CRYS ER 20 MEQ PO TBCR
40.0000 meq | EXTENDED_RELEASE_TABLET | Freq: Once | ORAL | Status: AC
  Administered 2019-06-28: 11:00:00 40 meq via ORAL
  Filled 2019-06-28: qty 2

## 2019-06-28 MED ORDER — SULFAMETHOXAZOLE-TRIMETHOPRIM 800-160 MG PO TABS
1.0000 | ORAL_TABLET | Freq: Two times a day (BID) | ORAL | Status: DC
  Administered 2019-06-28 – 2019-06-29 (×3): 1 via ORAL
  Filled 2019-06-28 (×3): qty 1

## 2019-06-28 MED ORDER — MAGNESIUM SULFATE 2 GM/50ML IV SOLN
2.0000 g | Freq: Once | INTRAVENOUS | Status: AC
  Administered 2019-06-28: 2 g via INTRAVENOUS
  Filled 2019-06-28: qty 50

## 2019-06-28 NOTE — Progress Notes (Signed)
Patient to 4N28, A/Ox4 on arrival and FC. Patient with following personal belongings at bedside: one shirt, one pair of jeans, one pair of underwear, one leather belt, one pair of socks, one Tarheels ball cap, one pair of shoes, one earring in dentures case, one black wallet (ID, debit card, misc cards, 3 1$ bills in cash). Items verified with Barb Merino, RN.  Candy Sledge, RN

## 2019-06-28 NOTE — Progress Notes (Signed)
Pt transferred to unit from 4N ICU with staff. Pt in stable condition. Pt placed on tele and seizure precautions set up. Call  [place] within reach, pt told to call for assistance. Will continue to monitor.

## 2019-06-28 NOTE — Progress Notes (Signed)
Wasted 28 mL of ativan with RN Lianne Bushy in trash receptacle.

## 2019-06-28 NOTE — Progress Notes (Signed)
Subjective: No further seizures since ER, continues to improve  He describes multiple episodes of focal left arm and leg stffening and shaking lasting 1 -2 minutes. He has been cutting back on his drinking over the past few days since he was doing work around the house.   Exam: Vitals:   06/28/19 0800 06/28/19 0810  BP:  (!) 125/105  Pulse: 87 83  Resp: 20 (!) 22  Temp: 98.7 F (37.1 C)   SpO2: 96% 100%   Gen: In bed, NAD Resp: non-labored breathing, no acute distress Abd: soft, nt  Neuro: MS: awake, alert, interactive and appropriate BT:DHRCB, EOMI, VFF Motor: MAEW, no jerking Sensory:intact to LT  Pertinent Labs: Ammonia 125 yesterday -> 63 today AST 265, ALT 75 UDS - THC Mg 1.1  Impression: 58 yo M with history of liver dysfunction, EtOH abuse, cardiomyopathy who presents with new onset focal seizures that have aborted with keppra. Possibilities include multifactorial seizure threshold lowering due to hypomag, hyperammoneia,  etoh withdrawal. The fact they are focal seizures does raise my concern and I discussed with him long term AED use, however he is hesitant at this time to commit to long term AED use.   Given the number of seizures, I would favor short term(e.g. one week) anti-epileptic use, but after this, given this is his only episode of seizures, long term AED use is less clearly indicated and holding off until a second seizure is reasonable.   Recommendations: 1) Keppra 500mg  BID x 1 week 2) MRI brain 3) Patient is unable to drive, operate heavy machinery, perform activities at heights or participate in water activities until release by outpatient physician. This was discussed with the patient who expressed understanding.   Roland Rack, MD [facility] [phone]  If 7pm- 7am, please page neurology on call as listed in [place].

## 2019-06-28 NOTE — Progress Notes (Signed)
NAME:  Shawn MooresStevie E Smith, MRN:  [id number], DOB:  [birth year], LOS: 1 ADMISSION DATE:  06/27/2019, CONSULTATION DATE:  06/27/19 REFERRING MD:  Dr. Donnald GarrePfeiffer, CHIEF COMPLAINT:  Seizure   Brief History   58 y/o M who presented with multiple seizures, elevated lactate.  Hx of ETOH abuse.  ETOH negative on admit.   History of present illness   58 y/o M who [facility]2 with reports of multiple seizures at home.  The patient was brought in by EMS.  He reported that he thought he had 5 seizures today prior to presentation.  His friends witnessed a seizure lasting approximately 2 minutes.  On arrival to ER, he was awake / alert but suffered another seizure while in ER during registration.  He denies history of seizures.    Initial ER evaluation notable for postictal state.  He was treated with IV ativan for seizure and loaded with keppra.  Labs - Na 136, K 3.1, Cl 93, CO2 12, glucose 130, BUN 5, Cr 1.07, AG 31, phos 2.8, Mg 1.1, albumin 4.5, AST 265, ALT 76, ammonia 125, CK 137, lactic acid > 11, WBC 6.2, Hgb 14.3, platelets clumped. UA with small Hgb, 5 ketones, nitrite positive, protein > 300, 21-50 WBC.  ETOH <10, Salicylate <7. CT of the head was screened and negative for acute process.    PCCM consulted for evaluation.   Pt reports he felt poorly this am but prior to that was in his usual state of health.  He ate lunch with friends and had two shots of liquor at lunch.  He reports he was aware of the "shaking" when it occurred but does not remember transport to the hospital.    Past Medical History  ETOH Abuse Cardiomyopathy - LVEF 55-60%, grade 1 diastolic dysfunction, 09/2017 Anxiety  Appendectomy  Hernia Repair  Significant Hospital Events   8/02 Admit with seizure  Consults:  PCCM  Neurology   Procedures:  None  Significant Diagnostic Tests:  CT Head 8/2 >> no acute intracranial abnormality  EEG 8//2 >> mild encephalopathy.  No seizures or epileptiform discharges. MRI  brain 8/3 >>   Micro Data:  COVID 8/2 >> neg Urine 8/3 >>   Antimicrobials:  Bactrim 8/3 >>  Interim history/subjective:  No further seizure episodes. Neuro feels not EtOH related.  Objective   Blood pressure (!) 125/105, pulse 83, temperature 98.7 F (37.1 C), temperature source Oral, resp. rate (!) 22, height 5\' 8"  (1.727 m), weight 87.9 kg, SpO2 100 %.        Intake/Output Summary (Last 24 hours) at 06/28/2019 0837 Last data filed at 06/28/2019 0800 Gross per 24 hour  Intake 4573.19 ml  Output 550 ml  Net 4023.19 ml   Filed Weights   06/27/19 1710 06/27/19 2005 06/28/19 0500  Weight: 87.5 kg 87.9 kg 87.9 kg    Examination: General:  Adult male, in NAD HEENT: [place] / AT.  MMM. EOMI. Neuro: A&O x 3, no deficits. CV: RRR, no M/R/G. PULM:  CTAB.  GI: BS x 4, S/NT/ND. Extremities: No deformities, no edema. Skin: No rashes or lesions.   Assessment & Plan:   Seizures / Status Epilepticus - unclear etiology thus far.  Neuro not convinced that these were EtOH related, but rather likely multifactorial in setting electrolyte derangements + UTI. - Neuro following / managing AED's. - MRI brain per neuro. - Seizure precautions.  UTI. - Start bactrim. - Follow cultures.  Lactic Acidosis - 2/2  seizures.  Now resolved. - No interventions required.  Hypokalemia.  Hypomagnesemia.  - 40 mEq K. - 2g Mag. - Follow BMP.  Elevated LFT's - pattern c/w EtOH. Hyperammonemia. - Continue lactulose, rifaxin. - EtOH cessation.   Best practice:  Diet: Reg diet. Pain/Anxiety/Delirium protocol (if indicated): n/a VAP protocol (if indicated): n/a  DVT prophylaxis: SCD's GI prophylaxis: Protonix  Glucose control: n/a  Mobility: As tolerated  Code Status: Full Code  Family Communication: Patient updated on plan of care.  Disposition:  Transfer out of ICU to tele.  Discussed with neuro.  Will ask [facility] to assume care in AM 8/4.   Montey Hora, [facility] Pager: [phone].  If no answer, [phone] 06/28/2019, 9:01 AM

## 2019-06-29 ENCOUNTER — Other Ambulatory Visit: Payer: Self-pay

## 2019-06-29 LAB — BASIC METABOLIC PANEL
Anion gap: 11 (ref 5–15)
BUN: <5 mg/dL — ABNORMAL LOW (ref 6–20)
CO2: 26 mmol/L (ref 22–32)
Calcium: 8.7 mg/dL — ABNORMAL LOW (ref 8.9–10.3)
Chloride: 100 mmol/L (ref 98–111)
Creatinine, Ser: 0.78 mg/dL (ref 0.61–1.24)
GFR calc Af Amer: >60 mL/min (ref >60)
GFR calc non Af Amer: >60 mL/min (ref >60)
Glucose, Bld: 96 mg/dL (ref 70–99)
Potassium: 3.3 mmol/L — ABNORMAL LOW (ref 3.5–5.1)
Sodium: 137 mmol/L (ref 135–145)

## 2019-06-29 LAB — MAGNESIUM: Magnesium: 1.6 mg/dL — ABNORMAL LOW (ref 1.7–2.4)

## 2019-06-29 LAB — PHOSPHORUS: Phosphorus: 2.2 mg/dL — ABNORMAL LOW (ref 2.5–4.6)

## 2019-06-29 MED ORDER — MAGNESIUM OXIDE (LAXATIVE) 500 MG PO TABS: 1.0000 | ORAL_TABLET | Freq: Every day | ORAL | 0 refills | Status: AC

## 2019-06-29 MED ORDER — ACETAMINOPHEN 500 MG PO TABS: 500.0000 mg | ORAL_TABLET | Freq: Four times a day (QID) | ORAL | 0 refills | Status: AC | PRN

## 2019-06-29 MED ORDER — SULFAMETHOXAZOLE-TRIMETHOPRIM 800-160 MG PO TABS: 1.0000 | ORAL_TABLET | Freq: Two times a day (BID) | ORAL | 0 refills | Status: AC

## 2019-06-29 MED ORDER — LEVETIRACETAM 500 MG PO TABS: 500.0000 mg | ORAL_TABLET | Freq: Two times a day (BID) | ORAL | 0 refills | Status: DC

## 2019-06-29 MED ORDER — MAGNESIUM SULFATE 2 GM/50ML IV SOLN
2.0000 g | Freq: Once | INTRAVENOUS | Status: AC
  Administered 2019-06-29: 2 g via INTRAVENOUS
  Filled 2019-06-29: qty 50

## 2019-06-29 MED ORDER — POTASSIUM CHLORIDE CRYS ER 20 MEQ PO TBCR
40.0000 meq | EXTENDED_RELEASE_TABLET | Freq: Once | ORAL | Status: AC
  Administered 2019-06-29: 08:00:00 40 meq via ORAL
  Filled 2019-06-29: qty 2

## 2019-06-29 MED ORDER — ACETAMINOPHEN 500 MG PO TABS: 500.0000 mg | ORAL_TABLET | Freq: Four times a day (QID) | ORAL | Status: DC | PRN

## 2019-06-29 MED ORDER — LACTULOSE 10 GM/15ML PO SOLN: 30.0000 g | Freq: Two times a day (BID) | ORAL | 0 refills | Status: DC

## 2019-06-29 NOTE — Discharge Instructions (Signed)
-  Continue Keppra 500 mg twice daily x1 week -Per [facility] statutes, patients with seizures are not allowed to drive until  they have been seizure-free for six months. Use caution when using heavy equipment or power tools. Avoid working on ladders or at heights. Take showers instead of baths. Ensure the water temperature is not too high on the home water heater. Do not go swimming alone. When caring for infants or small children, sit down when holding, feeding, or changing them to minimize risk of injury to the child in the event you have a seizure.

## 2019-06-29 NOTE — Discharge Summary (Signed)
Physician Discharge Summary   Patient ID: [name] MRN: [id number] DOB/AGE: [id number] y.o.  Admit date: 06/27/2019 Discharge date: 06/29/2019                     Discharge Plan by Diagnosis   Seizures / Status Epilepticus - \ Neuro not convinced that these were EtOH related, but rather likely multifactorial in setting electrolyte derangements + UTI. - Continue Keppra x 1 week. - EtOH cessation counseling. - Per [facility] statutes, patients with seizures are not allowed to drive until  they have been seizure-free for six months. Use caution when using heavy equipment or power tools. Avoid working on ladders or at heights. Take showers instead of baths. Ensure the water temperature is not too high on the home water heater. Do not go swimming alone. When caring for infants or small children, sit down when holding, feeding, or changing them to minimize risk of injury to the child in the event you have a seizure.  Also, Maintain good sleep hygiene. Avoid alcohol. - F/u with PCP (appt scheduled for 08/20/19).  UTI. - Continue bactrim x 3 more days.  Hypomagnesemia. - Magnesium Oxide '500mg'$  daily x 1 week and can reassess with PCP.  Transaminitis - c/w EtOH. Hyperammonemia. - EtOH cessation counseling. - Continue lactulose. - F/u with PCP (appt scheduled for 08/20/19).  Discharge Summary   [name] is a 58 y.o. y/o male with a PMH of EtOH abuse, anxiety, cardiomyopathy who presented to [facility] ER on 8/2 with reports of multiple seizures at home.  He reported that he thought he had 5 seizures today prior to presentation.  His friends witnessed a seizure lasting approximately 2 minutes.  On arrival to ER, he was awake / alert but suffered another seizure while in ER during registration.  He denies history of seizures.    Initial ER evaluation notable for postictal state.  He was treated with IV ativan for seizure and loaded with keppra. ETOH <09, Salicylate <7. CT of the head  was negative for acute process.  UA suggestive of UTI.  Started on bactrim 8/3.  Evaluated by neurology who felt that seizures were multifactorial in setting hypokalemia, hypomagnesemia, UTI.  EEG and brain MRI negative and without any clear cause of seizure foci identified.  Per neuro, continue keppra x 1 week empirically and f/u with PCP.  No driving in the meantime.  On 8/4, he was deemed medically stable and was cleared for discharge home.         Significant Hospital Events   8/2 > admit. 8/3 > tx out of ICU to tele. 8/4 > discharge.  Significant Diagnostic Studies  CT Head 8/2 >> no acute intracranial abnormality  EEG 8//2 >> mild encephalopathy.  No seizures or epileptiform discharges. MRI brain 8/3 >> atrophy with chronic small vessel ischemic changes.  No cause of seizure identified.  Micro Data  COVID 8/2 >> neg Urine 8/3 >>   Antimicrobials  Bactrim 8/3 > stop date 8/7.  Consults  Neuro.  Procedures (surgical and bedside):  None.   Objective:  Blood pressure (!) 132/98, pulse (!) 105, temperature 98 F (36.7 C), temperature source Oral, resp. rate 18, height '5\' 8"'$  (1.727 m), weight 84.6 kg, SpO2 100 %.        Intake/Output Summary (Last 24 hours) at 06/29/2019 1340 Last data filed at 06/28/2019 2353 Gross per 24 hour  Intake 801.54 ml  Output 650 ml  Net  151.54 ml   Filed Weights   06/27/19 2005 06/28/19 0500 06/29/19 0613  Weight: 87.9 kg 87.9 kg 84.6 kg    Physical Examination: General: Adult male, resting in bed, in NAD. Neuro: A&O x 3, non-focal.  HEENT: [place]/AT. EOMI, sclerae anicteric. Cardiovascular: RRR, no M/R/G.  Lungs: Respirations even and unlabored.  CTA bilaterally, No W/R/R. Abdomen: BS x 4, soft, NT/ND.  Musculoskeletal: No gross deformities, no edema.  Skin: Intact, warm, no rashes.   Discharge Labs:  BMET Recent Labs  Lab 06/27/19 1607 06/27/19 1628 06/27/19 1847 06/28/19 0556 06/29/19 0237  NA 136 134*  --  136 137  K 3.1*  3.1*  --  3.3* 3.3*  CL 93*  --   --  102 100  CO2 12*  --   --  25 26  GLUCOSE 130*  --   --  91 96  BUN 5*  --   --  <5* <5*  CREATININE 1.07  --  0.85 0.70 0.78  CALCIUM 8.9  --   --  7.7* 8.7*  MG 1.1*  --   --  1.7 1.6*  PHOS 2.8  --   --  3.0 2.2*    CBC Recent Labs  Lab 06/27/19 1607 06/27/19 1628 06/27/19 1847 06/28/19 0556  HGB 14.3 16.0 12.9* 11.5*  HCT 43.7 47.0 37.5* 34.2*  WBC 6.2  --  4.7 3.5*  PLT PLATELET CLUMPS NOTED ON SMEAR, COUNT APPEARS DECREASED  --  65* 65*    Anti-Coagulation Recent Labs  Lab 06/27/19 1607  INR 1.1       Follow-up Information    Biagio Borg, MD. Schedule an appointment as soon as possible for a visit in 1 week(s).   Specialties: Internal Medicine, Radiology Contact information: [address] [place] [address] [phone]            Allergies as of 06/29/2019   No Known Allergies     Medication List    STOP taking these medications   aspirin EC 325 MG tablet   zolpidem 10 MG tablet Commonly known as: AMBIEN     TAKE these medications   acetaminophen 500 MG tablet Commonly known as: TYLENOL Take 1 tablet (500 mg total) by mouth every 6 (six) hours as needed for mild pain. What changed:   how much to take  reasons to take this   citalopram 20 MG tablet Commonly known as: CeleXA Take 0.5 tablets (10 mg total) by mouth at bedtime.   lactulose 10 GM/15ML solution Commonly known as: CHRONULAC Take 45 mLs (30 g total) by mouth 2 (two) times daily.   levETIRAcetam 500 MG tablet Commonly known as: Keppra Take 1 tablet (500 mg total) by mouth 2 (two) times daily for 7 days.   Magnesium Oxide 500 MG (LAX) Tabs Take 1 tablet (500 mg total) by mouth daily for 7 days.   sulfamethoxazole-trimethoprim 800-160 MG tablet Commonly known as: BACTRIM DS Take 1 tablet by mouth every 12 (twelve) hours for 3 days.        Disposition: Home.   Discharge Condition:  [name] has met  maximum benefit of inpatient care and is medically stable and cleared for discharge.  Patient is pending follow up as above.     Time spent on discharge: Greater than 35 minutes.    Montey Hora, [facility] Pager: [phone].  If no answer, [phone]  [phone] 06/29/2019, 1:40 PM

## 2019-06-29 NOTE — Progress Notes (Signed)
Nsg Discharge Note  Admit Date:  06/27/2019 Discharge date: 06/29/2019   Monte Fantasia Amero to be D/C'd Home per MD order.  AVS completed.  Copy for chart, and copy for patient signed, and dated. Patient/caregiver able to verbalize understanding.  Discharge Medication: Allergies as of 06/29/2019   No Known Allergies     Medication List    STOP taking these medications   aspirin EC 325 MG tablet   zolpidem 10 MG tablet Commonly known as: AMBIEN     TAKE these medications   acetaminophen 500 MG tablet Commonly known as: TYLENOL Take 1 tablet (500 mg total) by mouth every 6 (six) hours as needed for mild pain. What changed:   how much to take  reasons to take this   citalopram 20 MG tablet Commonly known as: CeleXA Take 0.5 tablets (10 mg total) by mouth at bedtime.   lactulose 10 GM/15ML solution Commonly known as: CHRONULAC Take 45 mLs (30 g total) by mouth 2 (two) times daily.   levETIRAcetam 500 MG tablet Commonly known as: Keppra Take 1 tablet (500 mg total) by mouth 2 (two) times daily for 7 days. Notes to patient: 06/29/2019 evening   Magnesium Oxide 500 MG (LAX) Tabs Take 1 tablet (500 mg total) by mouth daily for 7 days.   sulfamethoxazole-trimethoprim 800-160 MG tablet Commonly known as: BACTRIM DS Take 1 tablet by mouth every 12 (twelve) hours for 3 days. Notes to patient: 06/29/2019 bedtime       Discharge Assessment: Vitals:   06/29/19 0612 06/29/19 1329  BP: 124/79 (!) 132/98  Pulse: 93 (!) 105  Resp: 18 18  Temp: 98.5 F (36.9 C) 98 F (36.7 C)  SpO2: 100% 100%   Skin clean, dry and intact without evidence of skin break down, no evidence of skin tears noted. IV catheter discontinued intact. Site without signs and symptoms of complications - no redness or edema noted at insertion site, patient denies c/o pain - only slight tenderness at site.  Dressing with slight pressure applied.  D/c Instructions-Education: Discharge instructions given to  patient/family with verbalized understanding. D/c education completed with patient/family including follow up instructions, medication list, d/c activities limitations if indicated, with other d/c instructions as indicated by MD - patient able to verbalize understanding, all questions fully answered. Patient instructed to return to ED, call 911, or call MD for any changes in condition.  Patient escorted via [place], and D/C home via private auto.  Tresa Endo, RN 06/29/2019 2:56 PM

## 2019-06-29 NOTE — Evaluation (Signed)
Physical Therapy Evaluation and Discharge Patient Details Name: [name] MRN: [id number] DOB: Nov 20, 1961 Today's Date: 06/29/2019   History of Present Illness  58 year old male with history of liver dysfunction, alcohol abuse who presented with new onset focal seizures that have been aborted with Keppra. Head CT negative  Clinical Impression   Patient evaluated by Physical Therapy with no further acute PT needs identified. All education has been completed and the patient has no further questions. Patient is at his baseline for mobility (using RW since bil hip replacements). PT is signing off. Thank you for this referral.     Follow Up Recommendations No PT follow up    Equipment Recommendations  None recommended by PT    Recommendations for Other Services       Precautions / Restrictions Precautions Precautions: Fall Precaution Comments: reports at least 3 falls in past 6 mos      Mobility  Bed Mobility                  Transfers Overall transfer level: Modified independent Equipment used: Rolling walker (2 wheeled);None                Ambulation/Gait Ambulation/Gait assistance: Modified independent (Device/Increase time) Gait Distance (Feet): 180 Feet Assistive device: Rolling walker (2 wheeled) Gait Pattern/deviations: Step-through pattern;Trunk flexed   Gait velocity interpretation: 1.31 - 2.62 ft/sec, indicative of limited community ambulator General Gait Details: reports he feels a little weak, but no more unsteady than usual; no imbalance while walking/transferring  Stairs Stairs: (pt mobilizing well and did not feel steps would be an issue)          Wheelchair Mobility    Modified Rankin (Stroke Patients Only)       Balance Overall balance assessment: Mild deficits observed, not formally tested(reports since he had bil THR has always used walker or cane)                                           Pertinent  Vitals/Pain Pain Assessment: No/denies pain    Home Living Family/patient expects to be discharged to:: Private residence Living Arrangements: Alone Available Help at Discharge: Family;Available PRN/intermittently(brother lives next door) Type of Home: House Home Access: Stairs to enter Entrance Stairs-Rails: Right;Left;Can reach both Entrance Stairs-Number of Steps: 12 Home Layout: Two level;Able to live on main level with bedroom/bathroom Home Equipment: Grab bars - tub/shower;Walker - 4 wheels;Cane - quad;Wheelchair - manual;Bedside commode      Prior Function Level of Independence: Independent with assistive device(s)         Comments: uses RW in home; cane outside     Hand Dominance        Extremity/Trunk Assessment   Upper Extremity Assessment Upper Extremity Assessment: Generalized weakness    Lower Extremity Assessment Lower Extremity Assessment: Generalized weakness    Cervical / Trunk Assessment Cervical / Trunk Assessment: Kyphotic  Communication   Communication: No difficulties  Cognition Arousal/Alertness: Awake/alert Behavior During Therapy: WFL for tasks assessed/performed Overall Cognitive Status: Within Functional Limits for tasks assessed                                        General Comments      Exercises     Assessment/Plan    PT  Assessment Patent does not need any further PT services  PT Problem List         PT Treatment Interventions      PT Goals (Current goals can be found in the Care Plan section)  Acute Rehab PT Goals PT Goal Formulation: All assessment and education complete, DC therapy    Frequency     Barriers to discharge        Co-evaluation               AM-PAC PT "6 Clicks" Mobility  Outcome Measure Help needed turning from your back to your side while in a flat bed without using bedrails?: None Help needed moving from lying on your back to sitting on the side of a flat bed without  using bedrails?: None Help needed moving to and from a bed to a chair (including a wheelchair)?: None Help needed standing up from a chair using your arms (e.g., wheelchair or bedside chair)?: None Help needed to walk in hospital room?: None Help needed climbing 3-5 steps with a railing? : None 6 Click Score: 24    End of Session   Activity Tolerance: Patient tolerated treatment well Patient left: in chair;with call bell/phone within reach;with chair alarm set Nurse Communication: Mobility status;Other (comment)(no PT/DME needs) PT Visit Diagnosis: Unsteadiness on feet (R26.81)    Time: [id number] PT Time Calculation (min) (ACUTE ONLY): 17 min   Charges:   PT Evaluation $PT Eval Low Complexity: 1 Low            Veda CanningLynn Kaliq Lege, PT      [place] Charles[place] P Delquan Poucher 06/29/2019, 11:58 AM

## 2019-06-29 NOTE — Progress Notes (Signed)
NEUROLOGY PROGRESS NOTE  Subjective: At this point patient has no complaints and no further seizures.  Exam: Vitals:   06/28/19 2156 06/29/19 0612  BP: (!) 130/96 124/79  Pulse: 98 93  Resp: 18 18  Temp: 98.1 F (36.7 C) 98.5 F (36.9 C)  SpO2: 100% 100%    Physical Exam   HEENT-  Normocephalic, no lesions, without obvious abnormality.  Normal external eye and conjunctiva.   Extremities- Warm, dry and intact Musculoskeletal-no joint tenderness, deformity or swelling Skin-warm and dry, no hyperpigmentation, vitiligo, or suspicious lesions    Neuro:  Mental Status: Alert, oriented, thought content appropriate.  Speech fluent without evidence of aphasia.  Able to follow 3 step commands without difficulty. Cranial Nerves: II:  Visual fields grossly normal,  III,IV, VI: ptosis not present, extra-ocular motions intact bilaterally pupils equal, round, reactive to light and accommodation V,VII: smile symmetric, facial light touch sensation normal bilaterally VIII: hearing normal bilaterally IX,X: Palate rises midline XI: bilateral shoulder shrug XII: midline tongue extension Motor: Right : Upper extremity   5/5    Left:     Upper extremity   5/5  Lower extremity   5/5     Lower extremity   5/5 Tone and bulk:normal tone throughout; no atrophy noted-slight tremor when hands are held out Sensory: Pinprick and light touch intact throughout, bilaterally Plantars: Right: downgoing   Left: downgoing Cerebellar: normal finger-to-nose,      Medications:  Prior to Admission:  Medications Prior to Admission  Medication Sig Dispense Refill Last Dose  . acetaminophen (TYLENOL) 500 MG tablet Take 500-1,000 mg by mouth every 6 (six) hours as needed (for pain).   Past Month at Unknown time  . aspirin EC 325 MG tablet Take 1 tablet (325 mg total) by mouth 2 (two) times daily. (Patient taking differently: Take 325 mg by mouth 4 (four) times a week. ) 84 tablet 0 06/24/2019 at 1000  .  citalopram (CELEXA) 20 MG tablet Take 0.5 tablets (10 mg total) by mouth at bedtime. 45 tablet 3 06/25/2019 at pm  . zolpidem (AMBIEN) 10 MG tablet Take 1 tablet (10 mg total) by mouth at bedtime as needed for up to 30 days for sleep. 30 tablet 2 06/25/2019 at pm   Scheduled: . lactulose  30 g Oral BID  . rifaximin  550 mg Oral BID  . sulfamethoxazole-trimethoprim  1 tablet Oral Q12H   Continuous: . sodium chloride Stopped (06/28/19 2324)  . levETIRAcetam 500 mg (06/29/19 0806)    Pertinent Labs/Diagnostics: -Potassium 3.3 -BUN less than 5 Calcium 8.7  Ct Head Wo Contrast  Result Date: 06/27/2019  IMPRESSION: 1. No acute intracranial abnormality.  Normal brain. Electronically Signed   By: Signa Kellaylor  Stroud M.D.   On: 06/27/2019 17:42   Mr Brain Wo Contrast  Result Date: 06/28/2019 CLINICAL DATA:  Acute presentation with seizure. Left arm and leg stiffness and shaking. EXAM: MRI HEAD WITHOUT CONTRAST TECHNIQUE:  IMPRESSION: Atrophy. Chronic small-vessel ischemic changes of the white matter. No acute or reversible process. No cause of seizure identified. Study does suffer from significant motion degradation and is of "low resolution". Electronically Signed   By: Paulina FusiMark  Shogry M.D.   On: 06/28/2019 14:54      Felicie MornDavid Horrace Hanak PA-C [facility] [phone]   Assessment: 58 year old male with history of liver dysfunction, alcohol abuse who presented with new onset focal seizures that have been aborted with Keppra.  Seizure could have been multifactorial secondary to hypomagnesemia, hyperammonemia, alcohol withdrawal. no  further seizures.  Patient understands that he will be on Keppra 500 mg twice daily for 1 week and also seizure precautions.  Recommendations: -Continue Keppra 500 mg twice daily x1 week -Per [facility] statutes, patients with seizures are not allowed to drive until  they have been seizure-free for six months. Use caution when using heavy equipment or power  tools. Avoid working on ladders or at heights. Take showers instead of baths. Ensure the water temperature is not too high on the home water heater. Do not go swimming alone. When caring for infants or small children, sit down when holding, feeding, or changing them to minimize risk of injury to the child in the event you have a seizure.   Also, Maintain good sleep hygiene. Avoid alcohol.   I have discussed with Dr. Starla Link and if patient clears with physical therapy he will be going home. At this time neurology will sign off.  If any further assistance needed Alekh will call back    06/29/2019, 10:37 AM

## 2019-06-29 NOTE — Plan of Care (Signed)
  Problem: Education: Goal: Knowledge of General Education information will improve Description: Including pain rating scale, medication(s)/side effects and non-pharmacologic comfort measures Outcome: Adequate for Discharge   

## 2019-06-30 ENCOUNTER — Telehealth: Payer: Self-pay | Admitting: *Deleted

## 2019-06-30 NOTE — Telephone Encounter (Signed)
Transition Care Management Follow-up Telephone Call   Date discharged? 06/29/19   How have you been since you were released from the hospital? Spoke with patient, he states he is doing fine.    Do you understand why you were in the hospital? yes   Do you understand the discharge instructions? yes  Where were you discharged to? [facility]  Items Reviewed:  Medications reviewed: YES  Allergies reviewed: yes  Dietary changes reviewed: yes  Referrals reviewed: No referral was  recommended   Functional Questionnaire:   Activities of Daily Living (ADLs):   He states they are independent in the following: ambulation, bathing and hygiene, feeding, continence, grooming, toileting and dressing States no require assistance is needed.   Any transportation issues/concerns?: no   Any patient concerns? no   Confirmed importance and date/time of follow-up visits scheduled yes, appointment 07/05/19 Provider Appointment booked with Dr. Jenny Reichmann  Confirmed with patient if condition begins to worsen call PCP or go to the ER.  Patient was given the office number and encouraged to call back with question or concerns.  : yes

## 2019-07-05 ENCOUNTER — Encounter: Payer: Self-pay | Admitting: Internal Medicine

## 2019-07-05 ENCOUNTER — Other Ambulatory Visit: Payer: Self-pay

## 2019-07-05 ENCOUNTER — Ambulatory Visit (INDEPENDENT_AMBULATORY_CARE_PROVIDER_SITE_OTHER): Payer: Medicare HMO | Admitting: Internal Medicine

## 2019-07-05 VITALS — BP 110/76 | HR 114 | Temp 98.5°F | Resp 15 | Ht 68.0 in | Wt 194.0 lb

## 2019-07-05 DIAGNOSIS — N390 Urinary tract infection, site not specified: Secondary | ICD-10-CM

## 2019-07-05 DIAGNOSIS — G4700 Insomnia, unspecified: Secondary | ICD-10-CM | POA: Diagnosis not present

## 2019-07-05 DIAGNOSIS — G8929 Other chronic pain: Secondary | ICD-10-CM | POA: Diagnosis not present

## 2019-07-05 DIAGNOSIS — G40901 Epilepsy, unspecified, not intractable, with status epilepticus: Secondary | ICD-10-CM | POA: Diagnosis not present

## 2019-07-05 DIAGNOSIS — M5440 Lumbago with sciatica, unspecified side: Secondary | ICD-10-CM

## 2019-07-05 MED ORDER — TRAZODONE HCL 50 MG PO TABS: 25.0000 mg | ORAL_TABLET | Freq: Every evening | ORAL | 1 refills | Status: DC | PRN

## 2019-07-05 MED ORDER — LEVETIRACETAM 500 MG PO TABS: 500.0000 mg | ORAL_TABLET | Freq: Two times a day (BID) | ORAL | 3 refills | Status: DC

## 2019-07-05 NOTE — Patient Instructions (Signed)
Ok to continue the keppra  Please continue all other medications as before, including the antibiotic  Please take all new medication as prescribed - the trazodone for sleep  Please have the pharmacy call with any other refills you may need.  Please continue your efforts at being more active, low cholesterol diet, and weight control.  Please keep your appointments with your specialists as you may have planned  You will be contacted regarding the referral for: orthopedic

## 2019-07-05 NOTE — Progress Notes (Signed)
Subjective:    Patient ID: [name], male    DOB: 12/04/1960, 58 y.o.   MRN: [id number]  HPI  Here to f/u recent hospn 8/2 - 8/4; 58 y.o. y/o male with a PMH of EtOH abuse, anxiety, cardiomyopathy who presented to [facility] ER on 8/2 with reports of multiple seizures at home. He reported that he thought he had 5 seizures today prior to presentation. His friends witnessed a seizure lasting approximately 2 minutes   .  Seen per neurology who were not convinced that these were EtOH related, but rather likely multifactorial in setting electrolyte derangements + UTI. Continue Keppra x 1 week. EtOH cessation counseling.  Patient seen in f/u for seizures in setting of metabolic derangements and alcohol abuse.  Back to baseline.  On keppra.  CT of the head was negative for acute process. UA suggestive of UTI.  Started on bactrim 8/3.  Evaluated by neurology who felt that seizures were multifactorial in setting hypokalemia, hypomagnesemia, UTI.  EEG and brain MRI negative and without any clear cause of seizure foci identified.  Per neuro, continue keppra x 1 week empirically and f/u with PCP.  No driving in the meantime.   PT/OT/Neurology all think he is good to go home.  Should be okay to go home with usual driving restrictions.  1 week keppra, some magnesium oxide, bactrim for UTI, and lactulose.  Denies urinary symptoms such as dysuria, frequency, urgency, flank pain, hematuria or n/v, fever, chills.  No siezure since hopspn.   Wt Readings from Last 3 Encounters:  07/05/19 194 lb (88 kg)  06/29/19 186 lb 6.4 oz (84.6 kg)  05/20/19 193 lb (87.5 kg)   Past Medical History:  Diagnosis Date  . Alcohol abuse    in the past  . Anxiety   . Cardiomyopathy ([place])    hx of   Past Surgical History:  Procedure Laterality Date  . APPENDECTOMY    . HERNIA REPAIR    . TOTAL HIP ARTHROPLASTY Left 10/15/2013   Procedure: LEFT TOTAL HIP ARTHROPLASTY;  Surgeon: Yvette Rack., MD;  Location: [place];  Service:  Orthopedics;  Laterality: Left;  . TOTAL HIP ARTHROPLASTY Right 11/12/2017   Procedure: RIGHT TOTAL HIP ARTHROPLASTY ANTERIOR APPROACH;  Surgeon: Leandrew Koyanagi, MD;  Location: [place];  Service: Orthopedics;  Laterality: Right;    reports that he has been smoking cigarettes. He has a 1.00 pack-year smoking history. He has never used smokeless tobacco. He reports current alcohol use. He reports that he does not use drugs. family history includes Colon cancer in his brother; Diabetes in his paternal uncle; Heart failure in his brother; Liver cancer in his brother. No Known Allergies Current Outpatient Medications on File Prior to Visit  Medication Sig Dispense Refill  . acetaminophen (TYLENOL) 500 MG tablet Take 1 tablet (500 mg total) by mouth every 6 (six) hours as needed for mild pain. 30 tablet 0  . citalopram (CELEXA) 20 MG tablet Take 0.5 tablets (10 mg total) by mouth at bedtime. 45 tablet 3  . lactulose (CHRONULAC) 10 GM/15ML solution Take 45 mLs (30 g total) by mouth 2 (two) times daily. 236 mL 0   No current facility-administered medications on file prior to visit.    Review of Systems  Constitutional: Negative for other unusual diaphoresis or sweats HENT: Negative for ear discharge or swelling Eyes: Negative for other worsening visual disturbances Respiratory: Negative for stridor or other swelling  Gastrointestinal: Negative for worsening distension  or other blood Genitourinary: Negative for retention or other urinary change Musculoskeletal: Negative for other MSK pain or swelling Skin: Negative for color change or other new lesions Neurological: Negative for worsening tremors and other numbness  Psychiatric/Behavioral: Negative for worsening agitation or other fatigue All other system neg per pt    Objective:   Physical Exam BP 110/76   Pulse (!) 114   Temp 98.5 F (36.9 C) (Temporal)   Resp 15   Ht 5\' 8"  (1.727 m)   Wt 194 lb (88 kg)   SpO2 96%   BMI 29.50 kg/m  VS  noted,  Constitutional: Pt appears in NAD HENT: Head: NCAT.  Right Ear: External ear normal.  Left Ear: External ear normal.  Eyes: . Pupils are equal, round, and reactive to light. Conjunctivae and EOM are normal Nose: without d/c or deformity Neck: Neck supple. Gross normal ROM Cardiovascular: Normal rate and regular rhythm.   Pulmonary/Chest: Effort normal and breath sounds without rales or wheezing.  Abd:  Soft, NT, ND, + BS, no organomegaly Neurological: Pt is alert. At baseline orientation, motor grossly intact Skin: Skin is warm. No rashes, other new lesions, no LE edema Psychiatric: Pt behavior is normal without agitation  No other exam findings Lab Results  Component Value Date   WBC 3.5 (L) 06/28/2019   HGB 11.5 (L) 06/28/2019   HCT 34.2 (L) 06/28/2019   PLT 65 (L) 06/28/2019   GLUCOSE 96 06/29/2019   CHOL 213 (H) 04/27/2019   TRIG 135.0 04/27/2019   HDL 78.30 04/27/2019   LDLDIRECT 109.8 07/22/2014   LDLCALC 108 (H) 04/27/2019   ALT 76 (H) 06/27/2019   AST 265 (H) 06/27/2019   NA 137 06/29/2019   K 3.3 (L) 06/29/2019   CL 100 06/29/2019   CREATININE 0.78 06/29/2019   BUN <5 (L) 06/29/2019   CO2 26 06/29/2019   TSH 1.18 04/27/2019   PSA 4.36 (H) 04/27/2019   INR 1.1 06/27/2019   HGBA1C 5.2 04/27/2019      Assessment & Plan:

## 2019-07-08 ENCOUNTER — Encounter: Payer: Self-pay | Admitting: Internal Medicine

## 2019-07-08 DIAGNOSIS — N390 Urinary tract infection, site not specified: Secondary | ICD-10-CM | POA: Insufficient documentation

## 2019-07-08 NOTE — Assessment & Plan Note (Signed)
Chronic persistent, for ortho referral

## 2019-07-08 NOTE — Assessment & Plan Note (Signed)
Stable, to continue anti-epileptic

## 2019-07-08 NOTE — Assessment & Plan Note (Signed)
Mild to mod, for trazodone qhs prn,  to f/u any worsening symptoms or concerns 

## 2019-07-08 NOTE — Assessment & Plan Note (Signed)
Improved, to finish antibx,  to f/u any worsening symptoms or concerns  

## 2019-07-15 ENCOUNTER — Telehealth: Payer: Self-pay | Admitting: Internal Medicine

## 2019-07-15 DIAGNOSIS — R569 Unspecified convulsions: Secondary | ICD-10-CM

## 2019-07-15 NOTE — Telephone Encounter (Signed)
Pt called and stated that the medication for his seizures have been working for the seizures but they cause him to sleep a lot and he feels like he cant function on them and they keep him up at night and they make him feel sleepy all day. He thinks they maybe too strong. Please advise Pt on other options

## 2019-07-15 NOTE — Telephone Encounter (Signed)
Referral done

## 2019-07-15 NOTE — Telephone Encounter (Signed)
As this is not in usual scope of care, I can refer him to neurology if he likes

## 2019-07-15 NOTE — Telephone Encounter (Signed)
Patient wanting referral to neurology

## 2019-07-18 ENCOUNTER — Inpatient Hospital Stay (HOSPITAL_COMMUNITY)
Admission: EM | Admit: 2019-07-18 | Discharge: 2019-07-20 | DRG: 897 | Disposition: A | Discharge status: Home / Self Care | Payer: Medicare HMO | Attending: Student | Admitting: Student

## 2019-07-18 ENCOUNTER — Other Ambulatory Visit: Payer: Self-pay

## 2019-07-18 ENCOUNTER — Encounter (HOSPITAL_COMMUNITY): Payer: Self-pay | Admitting: Pharmacy Technician

## 2019-07-18 ENCOUNTER — Emergency Department (HOSPITAL_COMMUNITY): Payer: Medicare HMO

## 2019-07-18 DIAGNOSIS — Z833 Family history of diabetes mellitus: Secondary | ICD-10-CM

## 2019-07-18 DIAGNOSIS — I77810 Thoracic aortic ectasia: Secondary | ICD-10-CM | POA: Diagnosis present

## 2019-07-18 DIAGNOSIS — E876 Hypokalemia: Secondary | ICD-10-CM | POA: Diagnosis not present

## 2019-07-18 DIAGNOSIS — F419 Anxiety disorder, unspecified: Secondary | ICD-10-CM | POA: Diagnosis not present

## 2019-07-18 DIAGNOSIS — Z20828 Contact with and (suspected) exposure to other viral communicable diseases: Secondary | ICD-10-CM | POA: Diagnosis not present

## 2019-07-18 DIAGNOSIS — R457 State of emotional shock and stress, unspecified: Secondary | ICD-10-CM | POA: Diagnosis not present

## 2019-07-18 DIAGNOSIS — F10229 Alcohol dependence with intoxication, unspecified: Principal | ICD-10-CM | POA: Diagnosis present

## 2019-07-18 DIAGNOSIS — I110 Hypertensive heart disease with heart failure: Secondary | ICD-10-CM | POA: Diagnosis present

## 2019-07-18 DIAGNOSIS — M1990 Unspecified osteoarthritis, unspecified site: Secondary | ICD-10-CM | POA: Diagnosis not present

## 2019-07-18 DIAGNOSIS — I429 Cardiomyopathy, unspecified: Secondary | ICD-10-CM | POA: Diagnosis not present

## 2019-07-18 DIAGNOSIS — I10 Essential (primary) hypertension: Secondary | ICD-10-CM | POA: Diagnosis not present

## 2019-07-18 DIAGNOSIS — D6959 Other secondary thrombocytopenia: Secondary | ICD-10-CM | POA: Diagnosis present

## 2019-07-18 DIAGNOSIS — R748 Abnormal levels of other serum enzymes: Secondary | ICD-10-CM

## 2019-07-18 DIAGNOSIS — Z800 Family history of malignant neoplasm of digestive organs: Secondary | ICD-10-CM

## 2019-07-18 DIAGNOSIS — I5042 Chronic combined systolic (congestive) and diastolic (congestive) heart failure: Secondary | ICD-10-CM | POA: Diagnosis not present

## 2019-07-18 DIAGNOSIS — Z79899 Other long term (current) drug therapy: Secondary | ICD-10-CM

## 2019-07-18 DIAGNOSIS — M1611 Unilateral primary osteoarthritis, right hip: Secondary | ICD-10-CM | POA: Diagnosis present

## 2019-07-18 DIAGNOSIS — R0682 Tachypnea, not elsewhere classified: Secondary | ICD-10-CM | POA: Diagnosis present

## 2019-07-18 DIAGNOSIS — R000 Tachycardia, unspecified: Secondary | ICD-10-CM | POA: Diagnosis present

## 2019-07-18 DIAGNOSIS — R569 Unspecified convulsions: Secondary | ICD-10-CM

## 2019-07-18 DIAGNOSIS — Y908 Blood alcohol level of 240 mg/100 ml or more: Secondary | ICD-10-CM | POA: Diagnosis present

## 2019-07-18 DIAGNOSIS — Z96643 Presence of artificial hip joint, bilateral: Secondary | ICD-10-CM | POA: Diagnosis present

## 2019-07-18 DIAGNOSIS — F17210 Nicotine dependence, cigarettes, uncomplicated: Secondary | ICD-10-CM | POA: Diagnosis present

## 2019-07-18 DIAGNOSIS — F10939 Alcohol use, unspecified with withdrawal, unspecified: Secondary | ICD-10-CM | POA: Diagnosis present

## 2019-07-18 DIAGNOSIS — D696 Thrombocytopenia, unspecified: Secondary | ICD-10-CM | POA: Diagnosis not present

## 2019-07-18 DIAGNOSIS — F10239 Alcohol dependence with withdrawal, unspecified: Secondary | ICD-10-CM | POA: Diagnosis not present

## 2019-07-18 DIAGNOSIS — R7989 Other specified abnormal findings of blood chemistry: Secondary | ICD-10-CM | POA: Diagnosis not present

## 2019-07-18 DIAGNOSIS — G40909 Epilepsy, unspecified, not intractable, without status epilepticus: Secondary | ICD-10-CM | POA: Diagnosis not present

## 2019-07-18 DIAGNOSIS — F1010 Alcohol abuse, uncomplicated: Secondary | ICD-10-CM | POA: Diagnosis not present

## 2019-07-18 DIAGNOSIS — R55 Syncope and collapse: Secondary | ICD-10-CM | POA: Diagnosis not present

## 2019-07-18 DIAGNOSIS — E872 Acidosis: Secondary | ICD-10-CM | POA: Diagnosis present

## 2019-07-18 DIAGNOSIS — Z03818 Encounter for observation for suspected exposure to other biological agents ruled out: Secondary | ICD-10-CM | POA: Diagnosis not present

## 2019-07-18 DIAGNOSIS — R945 Abnormal results of liver function studies: Secondary | ICD-10-CM | POA: Diagnosis not present

## 2019-07-18 DIAGNOSIS — F329 Major depressive disorder, single episode, unspecified: Secondary | ICD-10-CM | POA: Diagnosis not present

## 2019-07-18 DIAGNOSIS — Z9114 Patient's other noncompliance with medication regimen: Secondary | ICD-10-CM | POA: Diagnosis not present

## 2019-07-18 DIAGNOSIS — Z8249 Family history of ischemic heart disease and other diseases of the circulatory system: Secondary | ICD-10-CM | POA: Diagnosis not present

## 2019-07-18 DIAGNOSIS — F10129 Alcohol abuse with intoxication, unspecified: Secondary | ICD-10-CM | POA: Diagnosis not present

## 2019-07-18 DIAGNOSIS — R296 Repeated falls: Secondary | ICD-10-CM | POA: Diagnosis not present

## 2019-07-18 LAB — CBC WITH DIFFERENTIAL/PLATELET
Abs Immature Granulocytes: 0.01 10*3/uL (ref 0.00–0.07)
Basophils Absolute: 0.1 10*3/uL (ref 0.0–0.1)
Basophils Relative: 1 %
Eosinophils Absolute: 0.1 10*3/uL (ref 0.0–0.5)
Eosinophils Relative: 2 %
HCT: 41 % (ref 39.0–52.0)
Hemoglobin: 13.7 g/dL (ref 13.0–17.0)
Immature Granulocytes: 0 %
Lymphocytes Relative: 44 %
Lymphs Abs: 1.9 10*3/uL (ref 0.7–4.0)
MCH: 31.7 pg (ref 26.0–34.0)
MCHC: 33.4 g/dL (ref 30.0–36.0)
MCV: 94.9 fL (ref 80.0–100.0)
Monocytes Absolute: 0.4 10*3/uL (ref 0.1–1.0)
Monocytes Relative: 10 %
Neutro Abs: 1.8 10*3/uL (ref 1.7–7.7)
Neutrophils Relative %: 43 %
Platelets: 129 10*3/uL — ABNORMAL LOW (ref 150–400)
RBC: 4.32 MIL/uL (ref 4.22–5.81)
RDW: 14.5 % (ref 11.5–15.5)
WBC: 4.3 10*3/uL (ref 4.0–10.5)
nRBC: 0 % (ref 0.0–0.2)

## 2019-07-18 LAB — COMPREHENSIVE METABOLIC PANEL
ALT: 50 U/L — ABNORMAL HIGH (ref 0–44)
AST: 103 U/L — ABNORMAL HIGH (ref 15–41)
Albumin: 4.2 g/dL (ref 3.5–5.0)
Alkaline Phosphatase: 69 U/L (ref 38–126)
Anion gap: 21 — ABNORMAL HIGH (ref 5–15)
BUN: 6 mg/dL (ref 6–20)
CO2: 18 mmol/L — ABNORMAL LOW (ref 22–32)
Calcium: 8.8 mg/dL — ABNORMAL LOW (ref 8.9–10.3)
Chloride: 101 mmol/L (ref 98–111)
Creatinine, Ser: 0.73 mg/dL (ref 0.61–1.24)
GFR calc Af Amer: >60 mL/min (ref >60)
GFR calc non Af Amer: >60 mL/min (ref >60)
Glucose, Bld: 110 mg/dL — ABNORMAL HIGH (ref 70–99)
Potassium: 4 mmol/L (ref 3.5–5.1)
Sodium: 140 mmol/L (ref 135–145)
Total Bilirubin: 0.9 mg/dL (ref 0.3–1.2)
Total Protein: 7 g/dL (ref 6.5–8.1)

## 2019-07-18 LAB — AMMONIA: Ammonia: 59 umol/L — ABNORMAL HIGH (ref 9–35)

## 2019-07-18 LAB — ETHANOL: Alcohol, Ethyl (B): 304 mg/dL (ref <10)

## 2019-07-18 LAB — CK: Total CK: 127 U/L (ref 49–397)

## 2019-07-18 LAB — MAGNESIUM: Magnesium: 1.5 mg/dL — ABNORMAL LOW (ref 1.7–2.4)

## 2019-07-18 MED ORDER — LORAZEPAM 1 MG PO TABS: 0.0000 mg | ORAL_TABLET | Freq: Two times a day (BID) | ORAL | Status: DC

## 2019-07-18 MED ORDER — LACTULOSE 10 GM/15ML PO SOLN
30.0000 g | Freq: Two times a day (BID) | ORAL | Status: DC
  Administered 2019-07-18 – 2019-07-20 (×4): 30 g via ORAL
  Filled 2019-07-18: qty 45
  Filled 2019-07-18 (×2): qty 60
  Filled 2019-07-18 (×2): qty 45

## 2019-07-18 MED ORDER — ONDANSETRON HCL 4 MG/2ML IJ SOLN
4.0000 mg | Freq: Four times a day (QID) | INTRAMUSCULAR | Status: DC | PRN
  Administered 2019-07-19: 02:00:00 4 mg via INTRAVENOUS
  Filled 2019-07-18: qty 2

## 2019-07-18 MED ORDER — LORAZEPAM 2 MG/ML IJ SOLN
0.0000 mg | Freq: Four times a day (QID) | INTRAMUSCULAR | Status: AC
  Administered 2019-07-19: 1 mg via INTRAVENOUS
  Administered 2019-07-19: 2 mg via INTRAVENOUS
  Administered 2019-07-20: 1 mg via INTRAVENOUS
  Filled 2019-07-18 (×5): qty 1

## 2019-07-18 MED ORDER — THIAMINE HCL 100 MG/ML IJ SOLN
100.0000 mg | Freq: Every day | INTRAMUSCULAR | Status: DC
  Administered 2019-07-19: 100 mg via INTRAVENOUS
  Filled 2019-07-18 (×2): qty 2

## 2019-07-18 MED ORDER — MAGNESIUM SULFATE 2 GM/50ML IV SOLN
2.0000 g | Freq: Once | INTRAVENOUS | Status: AC
  Administered 2019-07-18: 2 g via INTRAVENOUS
  Filled 2019-07-18: qty 50

## 2019-07-18 MED ORDER — LORAZEPAM 1 MG PO TABS: 0.0000 mg | ORAL_TABLET | Freq: Four times a day (QID) | ORAL | Status: AC

## 2019-07-18 MED ORDER — TRAZODONE HCL 50 MG PO TABS
50.0000 mg | ORAL_TABLET | Freq: Every evening | ORAL | Status: DC | PRN
  Administered 2019-07-19 (×2): 50 mg via ORAL
  Filled 2019-07-18 (×2): qty 1

## 2019-07-18 MED ORDER — ONDANSETRON HCL 4 MG PO TABS: 4.0000 mg | ORAL_TABLET | Freq: Four times a day (QID) | ORAL | Status: DC | PRN

## 2019-07-18 MED ORDER — ENOXAPARIN SODIUM 40 MG/0.4ML ~~LOC~~ SOLN
40.0000 mg | SUBCUTANEOUS | Status: DC
  Administered 2019-07-20: 40 mg via SUBCUTANEOUS
  Filled 2019-07-18 (×2): qty 0.4

## 2019-07-18 MED ORDER — THIAMINE HCL 100 MG/ML IJ SOLN
Freq: Once | INTRAVENOUS | Status: AC
  Administered 2019-07-18: 23:00:00 via INTRAVENOUS
  Filled 2019-07-18: qty 1000

## 2019-07-18 MED ORDER — LACOSAMIDE 50 MG PO TABS
100.0000 mg | ORAL_TABLET | Freq: Once | ORAL | Status: AC
  Administered 2019-07-18: 100 mg via ORAL
  Filled 2019-07-18: qty 2

## 2019-07-18 MED ORDER — ACETAMINOPHEN 500 MG PO TABS: 500.0000 mg | ORAL_TABLET | Freq: Four times a day (QID) | ORAL | Status: DC | PRN

## 2019-07-18 MED ORDER — CITALOPRAM HYDROBROMIDE 10 MG PO TABS
10.0000 mg | ORAL_TABLET | Freq: Every day | ORAL | Status: DC
  Administered 2019-07-18 – 2019-07-19 (×2): 10 mg via ORAL
  Filled 2019-07-18 (×2): qty 1

## 2019-07-18 MED ORDER — LORAZEPAM 2 MG/ML IJ SOLN
0.0000 mg | Freq: Two times a day (BID) | INTRAMUSCULAR | Status: DC
  Administered 2019-07-18: 1 mg via INTRAVENOUS

## 2019-07-18 MED ORDER — VITAMIN B-1 100 MG PO TABS
100.0000 mg | ORAL_TABLET | Freq: Every day | ORAL | Status: DC
  Administered 2019-07-18 – 2019-07-20 (×2): 100 mg via ORAL
  Filled 2019-07-18 (×3): qty 1

## 2019-07-18 NOTE — ED Provider Notes (Signed)
[facility] Provider Note   CSN: [id number] Arrival date & time: 07/18/19  1449     History   Chief Complaint Chief Complaint  Patient presents with  . Seizures    HPI [name] is a 58 y.o. male with history of alcohol abuse and recent onset of seizures presents to the ED complaining of multiple seizure episodes over the past 2 to 3 days.  Patient was just admitted at the beginning of August for new onset seizures.  He reports he had never had seizures prior to this admission.  He reports he was discharged home on Keppra.  He reports the Keppra was making him excessively sleepy and he would sleep for "days at a time."  He reports he was told to stop his Keppra on Friday.  He reports that since then he has had approximately 4-5 seizure episodes.  He reports that no one has witnessed these episodes but he wakes up on the ground and is unsure how he got there.  He reports when these episodes happen earlier today and he woke up on the ground outside of his house and the last thing he remembered was being inside of his house.  He does admit to some alcohol use but states he is only had "a beer and a shot" today.  He reports he has not been taking his lactulose because he does not know what it is for.  He denies any recent fever, chills, cough, shortness of breath, abdominal pain, nausea, vomiting, diarrhea, or any other complaints.     The history is provided by the patient and medical records.    Past Medical History:  Diagnosis Date  . Alcohol abuse    in the past  . Anxiety   . Cardiomyopathy (HCC)    hx of    Patient Active Problem List   Diagnosis Date Noted  . Seizure disorder (HCC) 07/18/2019  . Hypomagnesemia 07/18/2019  . UTI (urinary tract infection) 07/08/2019  . Status epilepticus (HCC) 06/27/2019  . Alcohol withdrawal syndrome with complication (HCC)   . Lactic acidosis   . Insomnia 05/23/2019  . Low back pain 04/27/2019   . Recurrent falls 04/27/2019  . Grief 04/27/2019  . Hyperglycemia 04/27/2019  . B12 deficiency 04/27/2019  . Vitamin D deficiency 04/27/2019  . Right hip pain 12/04/2017  . 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 Hip joint replacement status   . Preop exam for internal medicine 10/11/2017  . Pyuria 10/08/2017  . 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 Hypersomnolence 02/17/2015  . Syncope 02/16/2015  . Arthritis of right hip 09/05/2014  . Essential hypertension, benign 07/24/2014  . Encounter for well adult exam with abnormal findings 07/22/2014  . Anxiety and depression 07/22/2014  . Alcohol abuse counseling and surveillance 06/25/2014  . Left hip pain 05/07/2013    Past Surgical History:  Procedure Laterality Date  . APPENDECTOMY    . HERNIA REPAIR    . 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 TOTAL HIP ARTHROPLASTY Right 11/12/2017   Procedure: RIGHT TOTAL HIP ARTHROPLASTY ANTERIOR APPROACH;  Surgeon: Tarry KosXu, Naiping M, MD;  Location: [facility] OR;  Service: Orthopedics;  Laterality: Right;        Home Medications    Prior to Admission medications   Medication Sig Start Date End Date Taking?  Authorizing Provider  acetaminophen (TYLENOL) 500 MG tablet Take 1 tablet (500 mg total) by mouth every 6 (six) hours as needed for mild pain. 06/29/19  Yes [name], MD  citalopram (CELEXA) 20 MG tablet Take 0.5 tablets (10 mg total) by mouth at bedtime. 05/20/19  Yes Biagio Borg, MD  lactulose (CHRONULAC) 10 GM/15ML solution Take 45 mLs (30 g total) by mouth 2 (two) times daily. 06/29/19  Yes [name], MD  levETIRAcetam (KEPPRA) 500 MG tablet Take 500 mg by mouth 2 (two) times daily.   Yes [provider]  traZODone (DESYREL) 50 MG tablet Take 0.5-1 tablets (25-50 mg total) by mouth at bedtime as needed for sleep. Patient taking  differently: Take 50 mg by mouth at bedtime as needed for sleep.  07/05/19  Yes Biagio Borg, MD    Family History Family History  Problem Relation Age of Onset  . Colon cancer Brother   . Heart failure Brother   . Liver cancer Brother   . Diabetes Paternal Uncle   . Stroke Neg Hx   . Learning disabilities Neg Hx   . Hypertension Neg Hx   . Heart disease Neg Hx     Social History Social History   Tobacco Use  . Smoking status: Current Every Day Smoker    Packs/day: 0.25    Years: 4.00    Pack years: 1.00    Types: Cigarettes  . Smokeless tobacco: Never Used  . Tobacco comment: 4-5 a day  Substance Use Topics  . Alcohol use: Yes    Comment: 2-3 shots a day  . Drug use: No     Allergies   Patient has no known allergies.   Review of Systems Review of Systems  Constitutional: Negative for chills and fever.  HENT: Negative for ear pain and sore throat.   Eyes: Negative for pain and visual disturbance.  Respiratory: Negative for cough and shortness of breath.   Cardiovascular: Negative for chest pain and palpitations.  Gastrointestinal: Negative for abdominal pain and vomiting.  Genitourinary: Negative for dysuria and hematuria.  Musculoskeletal: Negative for arthralgias and back pain.  Skin: Negative for color change and rash.  Neurological: Positive for tremors and seizures.  All other systems reviewed and are negative.    Physical Exam Updated Vital Signs BP 112/77   Pulse 91   Temp 98.2 F (36.8 C) (Oral)   Resp 17   Ht 5\' 8"  (1.727 m)   Wt 87.1 kg   SpO2 92%   BMI 29.19 kg/m   Physical Exam Vitals signs and nursing note reviewed.  Constitutional:      General: He is not in acute distress.    Appearance: Normal appearance. He is normal weight. He is not ill-appearing, toxic-appearing or diaphoretic.  HENT:     Head: Normocephalic and atraumatic.     Nose: Nose normal. No congestion or rhinorrhea.     Mouth/Throat:     Mouth: Mucous membranes  are moist.     Pharynx: Oropharynx is clear. No oropharyngeal exudate.  Eyes:     Extraocular Movements: Extraocular movements intact.     Conjunctiva/sclera: Conjunctivae normal.     Pupils: Pupils are equal, round, and reactive to light.  Neck:     Musculoskeletal: Normal range of motion and neck supple. No neck rigidity or muscular tenderness.  Cardiovascular:     Rate and Rhythm: Normal rate and regular rhythm.     Pulses: Normal pulses.  Heart sounds: Normal heart sounds. No murmur. No friction rub. No gallop.   Pulmonary:     Effort: Pulmonary effort is normal. No respiratory distress.     Breath sounds: Normal breath sounds. No stridor. No wheezing, rhonchi or rales.  Abdominal:     General: Abdomen is flat. There is no distension.     Palpations: Abdomen is soft.     Tenderness: There is no abdominal tenderness. There is no guarding or rebound.  Musculoskeletal: Normal range of motion.        General: No swelling, tenderness, deformity or signs of injury.  Skin:    General: Skin is warm and dry.  Neurological:     General: No focal deficit present.     Mental Status: He is alert and oriented to person, place, and time. Mental status is at baseline.     Cranial Nerves: No cranial nerve deficit.     Sensory: No sensory deficit.     Motor: No weakness.     Comments: Patient is tremulous.  Psychiatric:        Mood and Affect: Mood is anxious.        Behavior: Behavior normal.      ED Treatments / Results  Labs (all labs ordered are listed, but only abnormal results are displayed) Labs Reviewed  COMPREHENSIVE METABOLIC PANEL - Abnormal; Notable for the following components:      Result Value   CO2 18 (*)    Glucose, Bld 110 (*)    Calcium 8.8 (*)    AST 103 (*)    ALT 50 (*)    Anion gap 21 (*)    All other components within normal limits  AMMONIA - Abnormal; Notable for the following components:   Ammonia 59 (*)    All other components within normal limits   CBC WITH DIFFERENTIAL/PLATELET - Abnormal; Notable for the following components:   Platelets 129 (*)    All other components within normal limits  ETHANOL - Abnormal; Notable for the following components:   Alcohol, Ethyl (B) 304 (*)    All other components within normal limits  MAGNESIUM - Abnormal; Notable for the following components:   Magnesium 1.5 (*)    All other components within normal limits  SARS CORONAVIRUS 2  CK  CBC  COMPREHENSIVE METABOLIC PANEL    EKG EKG Interpretation  Date/Time:  Sunday July 18 2019 14:58:31 EDT Ventricular Rate:  93 PR Interval:    QRS Duration: 91 QT Interval:  368 QTC Calculation: 458 R Axis:   -5 Text Interpretation:  Sinus rhythm similar to previous Confirmed by Frederick PeersLittle, Rachel [id number]) on 07/18/2019 3:43:46 PM   Radiology Ct Head Wo Contrast  Result Date: 07/18/2019 CLINICAL DATA:  Seizure EXAM: CT HEAD WITHOUT CONTRAST TECHNIQUE: Contiguous axial images were obtained from the base of the skull through the vertex without intravenous contrast. COMPARISON:  06/27/2019 FINDINGS: Brain: No acute intracranial abnormality. Specifically, no hemorrhage, hydrocephalus, mass lesion, acute infarction, or significant intracranial injury. Vascular: No hyperdense vessel or unexpected calcification. Skull: No acute calvarial abnormality. Sinuses/Orbits: No acute finding Other: None IMPRESSION: No intracranial abnormality. Electronically Signed   By: Charlett NoseKevin  Dover M.D.   On: 07/18/2019 16:06    Procedures Procedures (including critical care time)  Medications Ordered in ED Medications  LORazepam (ATIVAN) injection 0-4 mg (0 mg Intravenous Not Given 07/18/19 2257)    Or  LORazepam (ATIVAN) tablet 0-4 mg ( Oral See Alternative 07/18/19 2257)  LORazepam (ATIVAN) injection  0-4 mg (1 mg Intravenous Given 07/18/19 1920)    Or  LORazepam (ATIVAN) tablet 0-4 mg ( Oral See Alternative 07/18/19 1920)  thiamine (VITAMIN B-1) tablet 100 mg (100 mg Oral Given  07/18/19 1714)    Or  thiamine (B-1) injection 100 mg ( Intravenous See Alternative 07/18/19 1714)  citalopram (CELEXA) tablet 10 mg (has no administration in time range)  lactulose (CHRONULAC) 10 GM/15ML solution 30 g (has no administration in time range)  acetaminophen (TYLENOL) tablet 500 mg (has no administration in time range)  traZODone (DESYREL) tablet 50 mg (has no administration in time range)  enoxaparin (LOVENOX) injection 40 mg (has no administration in time range)  sodium chloride 0.9 % 1,000 mL with thiamine 100 mg, folic acid 1 mg, multivitamins adult 10 mL infusion (has no administration in time range)  ondansetron (ZOFRAN) tablet 4 mg (has no administration in time range)    Or  ondansetron (ZOFRAN) injection 4 mg (has no administration in time range)  magnesium sulfate IVPB 2 g 50 mL (0 g Intravenous Stopped 07/18/19 2034)  lacosamide (VIMPAT) tablet 100 mg (100 mg Oral Given 07/18/19 2111)     Initial Impression / Assessment and Plan / ED Course  I have reviewed the triage vital signs and the nursing notes.  Pertinent labs & imaging results that were available during my care of the patient were reviewed by me and considered in my medical decision making (see chart for details).        Shawn MooresStevie E Ransome is a 58 y.o. male with history of alcohol abuse and recent onset of seizures presents to the ED complaining of multiple seizure episodes over the past 2 to 3 days since stopping Keppra.  On exam, patient is anxious and tremulous.  He has no focal neurologic deficits.  Alcohol level is elevated at 304.  CBC is unremarkable.  CMP shows improvement of patient's transaminitis from previous visit.  Ammonia level is mildly elevated at 59, also improved from previous admission.  CK within normal limits at 127.  Magnesium is low at 1.5 for which patient was given 2 g IV magnesium.  Patient was placed on CIWA protocol for possible alcohol withdrawal.  There is question as to  whether patient's episodes are actually seizures or syncopal episodes as they have all been unwitnessed and patient lives alone.  Patient reports he wakes up on the ground after the episodes and at times is too weak to get up so he just lays there.  Discussed case with neurology, who recommended placing patient on Vimpat 100 twice daily.  This was initiated in the ED.  The hospitalist was contacted for admission given patient's recurrent seizures versus syncopal episodes that are likely due to some component of alcohol withdrawal.  Patient will be admitted to the hospitalist service.   Final Clinical Impressions(s) / ED Diagnoses   Final diagnoses:  Seizure-like activity [facility](HCC)  Alcohol abuse    ED Discharge Orders    None       Garry HeaterEames, Kayliana Codd, MD 07/18/19 2306    Clarene DukeLittle, Ambrose Finlandachel Morgan, MD 07/20/19 [phone]

## 2019-07-18 NOTE — ED Notes (Signed)
Pt. Was asleep when entering room. Does not complain of any pain at this time.

## 2019-07-18 NOTE — H&P (Signed)
History and Physical   [name] YNW:[id number] DOB: 07-27-1961 DOA: 07/18/2019  Referring MD/NP/PA: Dr. Clarene DukeLittle  PCP: [name], [name], MD   Outpatient Specialists: None  Patient coming from: Home  Chief Complaint: Seizures versus syncope  HPI: Shawn MooresStevie E Smith is a 58 y.o. male with medical history significant of alcohol abuse, seizure disorder with recent hospitalization, cardiomyopathy, osteoarthritis with frequent falls, medication noncompliance presenting from home after sustaining what appears to be a seizure versus syncope.  Patient reported waking up on the ground not sure of how long his been there but he has passed out.  He has had multiple of these episodes in the past when he was diagnosed with seizure about 3 weeks ago.  Started on oral Keppra.  He was doing well initially but it was making him more sleepy and was not able to function on the Keppra.  Patient decided to stop taking the Keppra.  Today however he was noted to be intoxicated with alcohol also.  Not clear if he had a syncopal episode or seizure or even alcohol-related.  Patient is seen in the ER and neurology was consulted.  Recommendation is to admit the patient for work-up but switch him to Vimpat.  He is being admitted to the hospitalist for evaluation of possible seizure versus syncope..  ED Course: Temperature 98.2 blood pressure 127/92, pulse 130, respiratory rate of 28 and oxygen sat 98% room air.  White count is 4.3 hemoglobin 13.7 and platelets 129.  Sodium 140 potassium 4.1 chloride 101 CO2 of 18 and calcium 8.8 creatinine 0.73.  Ammonia level is 59.  AST 103 ALT 50.  Magnesium 1.5.  Anion gap 21.  Total bilirubin 0.9.  CK 127.  Alcohol level of 304.  Head CT without contrast is negative.  COVID-19 testing is pending, EKG showed normal sinus rhythm with a rate of 93, normal intervals.  Unchanged from previous  Review of Systems: As per HPI otherwise 10 point review of systems negative.    Past Medical  History:  Diagnosis Date  . Alcohol abuse    in the past  . Anxiety   . Cardiomyopathy (HCC)    hx of    Past Surgical History:  Procedure Laterality Date  . APPENDECTOMY    . HERNIA REPAIR    . 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 TOTAL HIP ARTHROPLASTY Right 11/12/2017   Procedure: RIGHT TOTAL HIP ARTHROPLASTY ANTERIOR APPROACH;  Surgeon: Tarry KosXu, Naiping M, MD;  Location: [facility] OR;  Service: Orthopedics;  Laterality: Right;     reports that he has been smoking cigarettes. He has a 1.00 pack-year smoking history. He has never used smokeless tobacco. He reports current alcohol use. He reports that he does not use drugs.  No Known Allergies  Family History  Problem Relation Age of Onset  . Colon cancer Brother   . Heart failure Brother   . Liver cancer Brother   . Diabetes Paternal Uncle   . Stroke Neg Hx   . Learning disabilities Neg Hx   . Hypertension Neg Hx   . Heart disease Neg Hx      Prior to Admission medications   Medication Sig Start Date End Date Taking? Authorizing Provider  acetaminophen (TYLENOL) 500 MG tablet Take 1 tablet (500 mg total) by mouth every 6 (six) hours as needed for mild pain.  06/29/19  Yes Lorin GlassSmith, Daniel C, MD  citalopram (CELEXA) 20 MG tablet Take 0.5 tablets (10 mg total) by mouth at bedtime. 05/20/19  Yes [name], [name], MD  lactulose (CHRONULAC) 10 GM/15ML solution Take 45 mLs (30 g total) by mouth 2 (two) times daily. 06/29/19  Yes Lorin GlassSmith, Daniel C, MD  levETIRAcetam (KEPPRA) 500 MG tablet Take 500 mg by mouth 2 (two) times daily.   Yes [provider]  traZODone (DESYREL) 50 MG tablet Take 0.5-1 tablets (25-50 mg total) by mouth at bedtime as needed for sleep. Patient taking differently: Take 50 mg by mouth at bedtime as needed for sleep.  07/05/19  Yes [name], [name], MD    Physical Exam: Vitals:    07/18/19 2030 07/18/19 2045 07/18/19 2100 07/18/19 2113  BP: (!) 127/92 92/75 (!) 105/91   Pulse:    (!) 112  Resp:      Temp:      TempSrc:      SpO2:    98%  Weight:      Height:          Constitutional: NAD, anxious, comfortable Vitals:   07/18/19 2030 07/18/19 2045 07/18/19 2100 07/18/19 2113  BP: (!) 127/92 92/75 (!) 105/91   Pulse:    (!) 112  Resp:      Temp:      TempSrc:      SpO2:    98%  Weight:      Height:       Eyes: PERRL, lids and conjunctivae normal ENMT: Mucous membranes are moist. Posterior pharynx clear of any exudate or lesions.Normal dentition.  Neck: normal, supple, no masses, no thyromegaly Respiratory: clear to auscultation bilaterally, no wheezing, no crackles. Normal respiratory effort. No accessory muscle use.  Cardiovascular: Regular rate and rhythm, no murmurs / rubs / gallops. No extremity edema. 2+ pedal pulses. No carotid bruits.  Abdomen: Distended with no ascites, no tenderness, no masses palpated. No hepatosplenomegaly. Bowel sounds positive.  Musculoskeletal: no clubbing / cyanosis. No joint deformity upper and lower extremities. Good ROM, no contractures. Normal muscle tone.  Skin: no rashes, lesions, ulcers. No induration Neurologic: CN 2-12 grossly intact. Sensation intact, DTR normal. Strength 5/5 in all 4.  Psychiatric: Normal judgment and insight. Alert and oriented x 3.  Anxious    Labs on Admission: I have personally reviewed following labs and imaging studies  CBC: Recent Labs  Lab 07/18/19 1510  WBC 4.3  NEUTROABS 1.8  HGB 13.7  HCT 41.0  MCV 94.9  PLT 129*   Basic Metabolic Panel: Recent Labs  Lab 07/18/19 1510  NA 140  K 4.0  CL 101  CO2 18*  GLUCOSE 110*  BUN 6  CREATININE 0.73  CALCIUM 8.8*  MG 1.5*   GFR: Estimated Creatinine Clearance: 110.7 mL/min (by C-G formula based on SCr of 0.73 mg/dL). Liver Function Tests: Recent Labs  Lab 07/18/19 1510  AST 103*  ALT 50*  ALKPHOS 69  BILITOT 0.9   PROT 7.0  ALBUMIN 4.2   No results for input(s): LIPASE, AMYLASE in the last 168 hours. Recent Labs  Lab 07/18/19 1517  AMMONIA 59*   Coagulation Profile: No results for input(s): INR, PROTIME in the last 168 hours. Cardiac Enzymes: Recent Labs  Lab 07/18/19 1510  CKTOTAL 127   BNP (last 3 results) Recent Labs    04/27/19 0957  PROBNP 16.0   HbA1C: No results for input(s): HGBA1C in the last 72 hours. CBG: No results for  input(s): GLUCAP in the last 168 hours. Lipid Profile: No results for input(s): CHOL, HDL, LDLCALC, TRIG, CHOLHDL, LDLDIRECT in the last 72 hours. Thyroid Function Tests: No results for input(s): TSH, T4TOTAL, FREET4, T3FREE, THYROIDAB in the last 72 hours. Anemia Panel: No results for input(s): VITAMINB12, FOLATE, FERRITIN, TIBC, IRON, RETICCTPCT in the last 72 hours. Urine analysis:    Component Value Date/Time   COLORURINE AMBER (A) 06/27/2019 1607   APPEARANCEUR HAZY (A) 06/27/2019 1607   LABSPEC 1.019 06/27/2019 1607   PHURINE 6.0 06/27/2019 1607   GLUCOSEU NEGATIVE 06/27/2019 1607   GLUCOSEU NEGATIVE 04/27/2019 0957   HGBUR SMALL (A) 06/27/2019 1607   BILIRUBINUR NEGATIVE 06/27/2019 1607   KETONESUR 5 (A) 06/27/2019 1607   PROTEINUR >=300 (A) 06/27/2019 1607   UROBILINOGEN 1.0 04/27/2019 0957   NITRITE POSITIVE (A) 06/27/2019 1607   LEUKOCYTESUR TRACE (A) 06/27/2019 1607   Sepsis Labs: @LABRCNTIP (procalcitonin:4,lacticidven:4) )No results found for this or any previous visit (from the past 240 hour(s)).   Radiological Exams on Admission: Ct Head Wo Contrast  Result Date: 07/18/2019 CLINICAL DATA:  Seizure EXAM: CT HEAD WITHOUT CONTRAST TECHNIQUE: Contiguous axial images were obtained from the base of the skull through the vertex without intravenous contrast. COMPARISON:  06/27/2019 FINDINGS: Brain: No acute intracranial abnormality. Specifically, no hemorrhage, hydrocephalus, mass lesion, acute infarction, or significant intracranial  injury. Vascular: No hyperdense vessel or unexpected calcification. Skull: No acute calvarial abnormality. Sinuses/Orbits: No acute finding Other: None IMPRESSION: No intracranial abnormality. Electronically Signed   By: Charlett NoseKevin  Dover M.D.   On: 07/18/2019 16:06      Assessment/Plan Principal Problem:   Seizure disorder (HCC) Active Problems:   Anxiety and depression   Essential hypertension, benign   Syncope   Primary osteoarthritis of right hip   Alcohol withdrawal syndrome with complication (HCC)     #1 seizure versus syncope: Patient will be initiated on Vimpat in place of Keppra.  Admit to monitored bed.  Work-up for possible syncope also.  Get echocardiogram as well as treat for possible alcohol withdrawal although his level today is high.  Follow neurology recommendations.  #2 alcohol abuse: Patient is intoxicated at the moment.  Initiate CIWA protocol.  Monitor response.  #3 hypertension: Continue home regimen and monitor closely.  Blood pressure is elevated  #4 anxiety with depression: Patient will be on benzodiazepines for his CIWA protocol.  Supportive care only  #5 thrombocytopenia: Most likely due to alcohol abuse.  Also elevated ammonia.  Continue to monitor closely.  #6 hypomagnesemia: Secondary to alcoholism: Replete magnesium.   DVT prophylaxis: Lovenox Code Status: Full Family Communication: No family at bedside Disposition Plan: Home Consults called: Neurology Admission status: Observation  Severity of Illness: The appropriate patient status for this patient is OBSERVATION. Observation status is judged to be reasonable and necessary in order to provide the required intensity of service to ensure the patient's safety. The patient's presenting symptoms, physical exam findings, and initial radiographic and laboratory data in the context of their medical condition is felt to place them at decreased risk for further clinical deterioration. Furthermore, it is  anticipated that the patient will be medically stable for discharge from the hospital within 2 midnights of admission. The following factors support the patient status of observation.   " The patient's presenting symptoms include seizure versus syncope. " The physical exam findings include irritable but no significant injury. " The initial radiographic and laboratory data are alcohol level of 305 as well as elevated ammonia level.  Barbette Merino MD [facility] Pager [phone]  If 7PM-7AM, please contact night-coverage [email or web address] Password [facility]  07/18/2019, 9:21 PM

## 2019-07-18 NOTE — ED Triage Notes (Signed)
Pt bib ems from home. Parole officer came by and found pt to be extremely anxious. Recently dx with sz and started on keppra. Pt has had trouble with the medication and MD told pt to stop taking on Friday. Pt states he has had 4 seizures since Friday. HR 120, BP 124/92, cbg 152, 100% RA, 97.7F.

## 2019-07-19 ENCOUNTER — Inpatient Hospital Stay (HOSPITAL_COMMUNITY): Payer: Medicare HMO

## 2019-07-19 DIAGNOSIS — R748 Abnormal levels of other serum enzymes: Secondary | ICD-10-CM

## 2019-07-19 DIAGNOSIS — R7989 Other specified abnormal findings of blood chemistry: Secondary | ICD-10-CM

## 2019-07-19 DIAGNOSIS — F329 Major depressive disorder, single episode, unspecified: Secondary | ICD-10-CM

## 2019-07-19 DIAGNOSIS — I10 Essential (primary) hypertension: Secondary | ICD-10-CM

## 2019-07-19 DIAGNOSIS — F10239 Alcohol dependence with withdrawal, unspecified: Secondary | ICD-10-CM

## 2019-07-19 DIAGNOSIS — R55 Syncope and collapse: Secondary | ICD-10-CM

## 2019-07-19 DIAGNOSIS — D696 Thrombocytopenia, unspecified: Secondary | ICD-10-CM

## 2019-07-19 DIAGNOSIS — F419 Anxiety disorder, unspecified: Secondary | ICD-10-CM

## 2019-07-19 LAB — COMPREHENSIVE METABOLIC PANEL
ALT: 51 U/L — ABNORMAL HIGH (ref 0–44)
AST: 127 U/L — ABNORMAL HIGH (ref 15–41)
Albumin: 4.1 g/dL (ref 3.5–5.0)
Alkaline Phosphatase: 70 U/L (ref 38–126)
Anion gap: 14 (ref 5–15)
BUN: 6 mg/dL (ref 6–20)
CO2: 23 mmol/L (ref 22–32)
Calcium: 8.4 mg/dL — ABNORMAL LOW (ref 8.9–10.3)
Chloride: 102 mmol/L (ref 98–111)
Creatinine, Ser: 0.73 mg/dL (ref 0.61–1.24)
GFR calc Af Amer: >60 mL/min (ref >60)
GFR calc non Af Amer: >60 mL/min (ref >60)
Glucose, Bld: 92 mg/dL (ref 70–99)
Potassium: 3.6 mmol/L (ref 3.5–5.1)
Sodium: 139 mmol/L (ref 135–145)
Total Bilirubin: 0.8 mg/dL (ref 0.3–1.2)
Total Protein: 6.5 g/dL (ref 6.5–8.1)

## 2019-07-19 LAB — CBC
HCT: 36.1 % — ABNORMAL LOW (ref 39.0–52.0)
Hemoglobin: 12 g/dL — ABNORMAL LOW (ref 13.0–17.0)
MCH: 31.5 pg (ref 26.0–34.0)
MCHC: 33.2 g/dL (ref 30.0–36.0)
MCV: 94.8 fL (ref 80.0–100.0)
Platelets: 100 10*3/uL — ABNORMAL LOW (ref 150–400)
RBC: 3.81 MIL/uL — ABNORMAL LOW (ref 4.22–5.81)
RDW: 14.4 % (ref 11.5–15.5)
WBC: 5.2 10*3/uL (ref 4.0–10.5)
nRBC: 0 % (ref 0.0–0.2)

## 2019-07-19 LAB — PHOSPHORUS: Phosphorus: 4.2 mg/dL (ref 2.5–4.6)

## 2019-07-19 LAB — SARS CORONAVIRUS 2 (TAT 6-24 HRS): SARS Coronavirus 2: NEGATIVE

## 2019-07-19 LAB — MAGNESIUM: Magnesium: 1.6 mg/dL — ABNORMAL LOW (ref 1.7–2.4)

## 2019-07-19 MED ORDER — ENSURE ENLIVE PO LIQD
237.0000 mL | Freq: Two times a day (BID) | ORAL | Status: DC
  Administered 2019-07-20: 237 mL via ORAL

## 2019-07-19 MED ORDER — MAGNESIUM SULFATE 2 GM/50ML IV SOLN
2.0000 g | Freq: Once | INTRAVENOUS | Status: AC
  Administered 2019-07-19: 2 g via INTRAVENOUS
  Filled 2019-07-19: qty 50

## 2019-07-19 MED ORDER — FOLIC ACID 1 MG PO TABS
1.0000 mg | ORAL_TABLET | Freq: Every day | ORAL | Status: DC
  Administered 2019-07-20: 1 mg via ORAL
  Filled 2019-07-19: qty 1

## 2019-07-19 MED ORDER — ADULT MULTIVITAMIN W/MINERALS CH
1.0000 | ORAL_TABLET | Freq: Every day | ORAL | Status: DC
  Administered 2019-07-20: 10:00:00 1 via ORAL
  Filled 2019-07-19: qty 1

## 2019-07-19 MED ORDER — PROMETHAZINE HCL 25 MG/ML IJ SOLN
12.5000 mg | Freq: Four times a day (QID) | INTRAMUSCULAR | Status: DC | PRN
  Administered 2019-07-19: 12.5 mg via INTRAVENOUS
  Filled 2019-07-19: qty 1

## 2019-07-19 MED ORDER — LORAZEPAM 2 MG/ML IJ SOLN
2.0000 mg | Freq: Once | INTRAMUSCULAR | Status: AC | PRN
  Administered 2019-07-19: 2 mg via INTRAVENOUS

## 2019-07-19 NOTE — ED Notes (Signed)
Pt returns from [place].  Restful without tremors or nausea noted.  Denies any needs.

## 2019-07-19 NOTE — ED Notes (Signed)
Anna(Lunch Tray Ordered) called @ 1106.

## 2019-07-19 NOTE — ED Notes (Signed)
Breakfast ordered--Lyfe Monger  

## 2019-07-19 NOTE — ED Notes (Signed)
Pts. Pocket knife was given to security.

## 2019-07-19 NOTE — Plan of Care (Signed)
  Problem: Education: Goal: Knowledge of General Education information will improve Description: Including pain rating scale, medication(s)/side effects and non-pharmacologic comfort measures Outcome: Progressing   Problem: Health Behavior/Discharge Planning: Goal: Ability to manage health-related needs will improve Outcome: Progressing   Problem: Clinical Measurements: Goal: Ability to maintain clinical measurements within normal limits will improve Outcome: Progressing Goal: Will remain free from infection Outcome: Progressing Goal: Diagnostic test results will improve Outcome: Progressing Goal: Respiratory complications will improve Outcome: Progressing Goal: Cardiovascular complication will be avoided Outcome: Progressing   Problem: Activity: Goal: Risk for activity intolerance will decrease Outcome: Progressing   Problem: Nutrition: Goal: Adequate nutrition will be maintained Outcome: Progressing   Problem: Coping: Goal: Level of anxiety will decrease Outcome: Progressing   Problem: Elimination: Goal: Will not experience complications related to bowel motility Outcome: Progressing Goal: Will not experience complications related to urinary retention Outcome: Progressing   Problem: Pain Managment: Goal: General experience of comfort will improve Outcome: Progressing   Problem: Safety: Goal: Ability to remain free from injury will improve Outcome: Progressing   Problem: Skin Integrity: Goal: Risk for impaired skin integrity will decrease Outcome: Progressing   Problem: Education: Goal: Expressions of having a comfortable level of knowledge regarding the disease process will increase Outcome: Progressing   Problem: Coping: Goal: Ability to adjust to condition or change in health will improve Outcome: Progressing Goal: Ability to identify appropriate support needs will improve Outcome: Progressing   Problem: Health Behavior/Discharge Planning: Goal:  Compliance with prescribed medication regimen will improve Outcome: Progressing   Problem: Medication: Goal: Risk for medication side effects will decrease Outcome: Progressing   Problem: Clinical Measurements: Goal: Complications related to the disease process, condition or treatment will be avoided or minimized Outcome: Progressing Goal: Diagnostic test results will improve Outcome: Progressing   Problem: Safety: Goal: Verbalization of understanding the information provided will improve Outcome: Progressing   Problem: Self-Concept: Goal: Level of anxiety will decrease Outcome: Progressing Goal: Ability to verbalize feelings about condition will improve Outcome: Progressing   Problem: Education: Goal: Knowledge of disease or condition will improve Outcome: Progressing Goal: Understanding of discharge needs will improve Outcome: Progressing   Problem: Health Behavior/Discharge Planning: Goal: Ability to identify changes in lifestyle to reduce recurrence of condition will improve Outcome: Progressing Goal: Identification of resources available to assist in meeting health care needs will improve Outcome: Progressing   Problem: Physical Regulation: Goal: Complications related to the disease process, condition or treatment will be avoided or minimized Outcome: Progressing   Problem: Safety: Goal: Ability to remain free from injury will improve Outcome: Progressing   Ival Bible, BSN, RN

## 2019-07-19 NOTE — ED Notes (Signed)
Pt. Vomited on the floor.

## 2019-07-19 NOTE — Progress Notes (Signed)
PROGRESS NOTE  [name]:[id number] DOB: 06-21-1961   PCP: Biagio Borg, MD  Patient is from: home  DOA: 07/18/2019 LOS: 1  Brief Narrative / Interim history: 58 y.o. male with history of alcohol abuse, seizure disorder with recent hospitalization, diastolic CHF, osteoarthritis with frequent falls, medication noncompliance presenting from home after sustaining what appears to be a seizure versus syncope and admitted.    In ED, tachycardic and tachypneic.  CBC with thrombocytopenia at 129.  CMP with bicarb to 18 and anion gap to 21.  AST 103.  ALT 50.  Magnesium 1.5.  CK 127.  EtOH level 304.  CT head without contrast without acute finding.  COVID-19 negative.  EKG sinus rhythm with normal intervals. Admitted for seizure and possible syncope in setting of alcohol intoxication.  Assessment & Plan: Seizure versus syncopal episode in the setting of alcohol intoxication -Reportedly took himself off Keppra due to somnolence -Reports taking about 6 beers a day.  EtOH level elevated on arrival. -Initiated on Vimpat per neurology.  -No events on telemetry.  EKG reassuring.  -For echocardiogram -Seizure and fall precaution. -PT/OT  Alcohol use disorder/intoxication: CIWA in the range of 7-8.  Some tremors on exam. -Counseled on moderation. -CIWA protocol with Ativan -Multivitamins, thiamine and folic acid -Closely monitor electrolytes and replenish aggressively.  Tachycardia/tachypnea: Likely due to the above. -Management as above  Elevated liver enzymes/ammonia: Likely due to alcohol use.  Mental status intact. -Check hepatitis panel and liver ultrasound. -Continue lactulose 30g twice daily. -Continue trending.  Anion gap metabolic acidosis: Resolved.  Likely due to alcohol location.  Hypomagnesemia: Likely due to alcohol. -Replenish and recheck  Hypertension: Normotensive.  Thrombocytopenia: Likely alcohol-related.  Medication noncompliance: Counseled.  History  of depression: Stable.  -Continue home Celexa and trazodone  DVT prophylaxis: Subcu Lovenox Code Status: Full code Family Communication: Patient and/or RN. Available if any question.  Patient lives alone. Disposition Plan: Remains inpatient due to high risk for alcohol withdrawal.  Also results) consulted Consultants: Neurology  Procedures:  None  Microbiology summarized: COVID-19 negative.  Antimicrobials: Anti-infectives (From admission, onward)   None      Sch Meds:  Scheduled Meds:  citalopram  10 mg Oral QHS   enoxaparin (LOVENOX) injection  40 mg Subcutaneous Q24H   lactulose  30 g Oral BID   LORazepam  0-4 mg Intravenous Q6H   Or   LORazepam  0-4 mg Oral Q6H   [START ON 07/21/2019] LORazepam  0-4 mg Intravenous Q12H   Or   [START ON 07/21/2019] LORazepam  0-4 mg Oral Q12H   thiamine  100 mg Oral Daily   Or   thiamine  100 mg Intravenous Daily   Continuous Infusions:  magnesium sulfate bolus IVPB     PRN Meds:.acetaminophen, ondansetron **OR** ondansetron (ZOFRAN) IV, promethazine, traZODone   Subjective: No major events overnight of this morning.  He reports some tremors in his hands.  Denies headache, vision change, chest pain, nausea, vomiting or abdominal pain.  CIWA score in the range of 7-8.  Reports drinking about 6 beers a day.  Denies smoking cigarettes or recreational drug use.  Denies SI or HI.  Objective: Vitals:   07/19/19 0800 07/19/19 0830 07/19/19 0900 07/19/19 1000  BP: (!) 133/97 (!) 120/93 (!) 128/95 (!) 129/94  Pulse:   (!) 104   Resp: 18 16 17    Temp:      TempSrc:      SpO2:   93%   Weight:  Height:        Intake/Output Summary (Last 24 hours) at 07/19/2019 1154 Last data filed at 07/19/2019 1000 Gross per 24 hour  Intake 1000 ml  Output --  Net 1000 ml   Filed Weights   07/18/19 1457  Weight: 87.1 kg    Examination:  GENERAL: No acute distress.  Appears well.  HEENT: MMM.  Vision and hearing grossly  intact.  NECK: Supple.  No apparent JVD.  RESP:  No IWOB. Good air movement bilaterally. CVS:  RRR. Heart sounds normal.  ABD/GI/GU: Bowel sounds present. Soft. Non tender.  Old surgical scar over RLQ. MSK/EXT:  Moves extremities. No apparent deformity or edema.  Tremors in upper extremities. SKIN: no apparent skin lesion or wound NEURO: Awake, alert and oriented appropriately.  No gross deficit.  Tremors in upper extremities. PSYCH: Calm. Normal affect.  Denies SI/HI.  I have personally reviewed the following labs and images: CBC: Recent Labs  Lab 07/18/19 1510 07/19/19 1016  WBC 4.3 5.2  NEUTROABS 1.8  --   HGB 13.7 12.0*  HCT 41.0 36.1*  MCV 94.9 94.8  PLT 129* 100*   BMP &GFR Recent Labs  Lab 07/18/19 1510 07/19/19 0451 07/19/19 0950  NA 140 139  --   K 4.0 3.6  --   CL 101 102  --   CO2 18* 23  --   GLUCOSE 110* 92  --   BUN 6 6  --   CREATININE 0.73 0.73  --   CALCIUM 8.8* 8.4*  --   MG 1.5*  --  1.6*  PHOS  --   --  4.2   Estimated Creatinine Clearance: 110.7 mL/min (by C-G formula based on SCr of 0.73 mg/dL). Liver & Pancreas: Recent Labs  Lab 07/18/19 1510 07/19/19 0451  AST 103* 127*  ALT 50* 51*  ALKPHOS 69 70  BILITOT 0.9 0.8  PROT 7.0 6.5  ALBUMIN 4.2 4.1   No results for input(s): LIPASE, AMYLASE in the last 168 hours. Recent Labs  Lab 07/18/19 1517  AMMONIA 59*   Diabetic: No results for input(s): HGBA1C in the last 72 hours. No results for input(s): GLUCAP in the last 168 hours. Cardiac Enzymes: Recent Labs  Lab 07/18/19 1510  CKTOTAL 127   Recent Labs    04/27/19 0957  PROBNP 16.0   Coagulation Profile: No results for input(s): INR, PROTIME in the last 168 hours. Thyroid Function Tests: No results for input(s): TSH, T4TOTAL, FREET4, T3FREE, THYROIDAB in the last 72 hours. Lipid Profile: No results for input(s): CHOL, HDL, LDLCALC, TRIG, CHOLHDL, LDLDIRECT in the last 72 hours. Anemia Panel: No results for input(s):  VITAMINB12, FOLATE, FERRITIN, TIBC, IRON, RETICCTPCT in the last 72 hours. Urine analysis:    Component Value Date/Time   COLORURINE AMBER (A) 06/27/2019 1607   APPEARANCEUR HAZY (A) 06/27/2019 1607   LABSPEC 1.019 06/27/2019 1607   PHURINE 6.0 06/27/2019 1607   GLUCOSEU NEGATIVE 06/27/2019 1607   GLUCOSEU NEGATIVE 04/27/2019 0957   HGBUR SMALL (A) 06/27/2019 1607   BILIRUBINUR NEGATIVE 06/27/2019 1607   KETONESUR 5 (A) 06/27/2019 1607   PROTEINUR >=300 (A) 06/27/2019 1607   UROBILINOGEN 1.0 04/27/2019 0957   NITRITE POSITIVE (A) 06/27/2019 1607   LEUKOCYTESUR TRACE (A) 06/27/2019 1607   Sepsis Labs: Invalid input(s): PROCALCITONIN, LACTICIDVEN  Microbiology: Recent Results (from the past 240 hour(s))  SARS CORONAVIRUS 2 Nasal Swab Aptima Multi Swab     Status: None   Collection Time: 07/18/19  7:03 PM  Specimen: Aptima Multi Swab; Nasal Swab  Result Value Ref Range Status   SARS Coronavirus 2 NEGATIVE NEGATIVE Final    Comment: (NOTE) SARS-CoV-2 target nucleic acids are NOT DETECTED. The SARS-CoV-2 RNA is generally detectable in upper and lower respiratory specimens during the acute phase of infection. Negative results do not preclude SARS-CoV-2 infection, do not rule out co-infections with other pathogens, and should not be used as the sole basis for treatment or other patient management decisions. Negative results must be combined with clinical observations, patient history, and epidemiological information. The expected result is Negative. Fact Sheet for Patients: HairSlick.nohttps://www.fda.gov/media/138098/download Fact Sheet for Healthcare Providers: quierodirigir.comhttps://www.fda.gov/media/138095/download This test is not yet approved or cleared by the [place] FDA and  has been authorized for detection and/or diagnosis of SARS-CoV-2 by FDA under an Emergency Use Authorization (EUA). This EUA will remain  in effect (meaning this test can be used) for the duration of the COVID-19  declaration under Section 56 4(b)(1) of the Act, 21 U.S.C. section 360bbb-3(b)(1), unless the authorization is terminated or revoked sooner. Performed at [facility], [address] [place], [place] [address]     Radiology Studies: Ct Head Wo Contrast  Result Date: 07/18/2019 CLINICAL DATA:  Seizure EXAM: CT HEAD WITHOUT CONTRAST TECHNIQUE: Contiguous axial images were obtained from the base of the skull through the vertex without intravenous contrast. COMPARISON:  06/27/2019 FINDINGS: Brain: No acute intracranial abnormality. Specifically, no hemorrhage, hydrocephalus, mass lesion, acute infarction, or significant intracranial injury. Vascular: No hyperdense vessel or unexpected calcification. Skull: No acute calvarial abnormality. Sinuses/Orbits: No acute finding Other: None IMPRESSION: No intracranial abnormality. Electronically Signed   By: Charlett NoseKevin  Dover M.D.   On: 07/18/2019 16:06   [place] Abdomen Limited Ruq  Result Date: 07/19/2019 CLINICAL DATA:  Elevated liver enzymes. EXAM: ULTRASOUND ABDOMEN LIMITED RIGHT UPPER QUADRANT COMPARISON:  CT 01/13/2017 report. FINDINGS: Gallbladder: No gallstones or wall thickening visualized. No sonographic Murphy sign noted by sonographer. Common bile duct: Diameter: 4.9 mm Liver: Heterogeneous hepatic parenchymal pattern suggesting fatty infiltration or hepatocellular disease. 0.9 mm probable cyst left hepatic lobe. Portal vein is patent on color Doppler imaging with normal direction of blood flow towards the liver. Other: None. IMPRESSION: 1.  No gallstones or biliary distention. 2.  0.9 cm probable cyst left hepatic lobe. Electronically Signed   By: Maisie Fushomas  Register   On: 07/19/2019 07:53   35 minutes with more than 50% spent in reviewing records, counseling patient and coordinating care.  Maralyn Witherell T. [facility]  If 7PM-7AM, please contact night-coverage [email or web address] Password [email or web address] 07/19/2019, 11:54 AM

## 2019-07-19 NOTE — ED Notes (Signed)
Updated on POC.  Still waiting for patients to be discharged from unit.

## 2019-07-19 NOTE — ED Notes (Signed)
ED TO INPATIENT HANDOFF REPORT  ED Nurse Name and Phone #: [phone], Jalila Goodnough/Tina RN  S Name/Age/Gender [name] 58 y.o. male Room/Bed: 037C/037C  Code Status   Code Status: Full Code  Home/SNF/Other Home Patient oriented to: A/O x4 Is this baseline? Yes   Triage Complete: Triage complete  Chief Complaint seizure   Triage Note Pt bib ems from home. Parole officer came by and found pt to be extremely anxious. Recently dx with sz and started on keppra. Pt has had trouble with the medication and MD told pt to stop taking on Friday. Pt states he has had 4 seizures since Friday. HR 120, BP 124/92, cbg 152, 100% RA, 97.40F.    Allergies No Known Allergies  Level of Care/Admitting Diagnosis ED Disposition    ED Disposition Condition [facility] Area: [place] [[id number]]  Level of Care: Telemetry Medical [104]  Covid Evaluation: Asymptomatic Screening Protocol (No Symptoms)  Diagnosis: Seizure disorder [facility]) [[id number]]  Admitting Physician: Elwyn Reach [[id number]]  Attending Physician: Elwyn Reach [[id number]]  Estimated length of stay: past midnight tomorrow  Certification:: I certify this patient will need inpatient services for at least 2 midnights  PT Class (Do Not Modify): Inpatient [101]  PT Acc Code (Do Not Modify): Private [1]       B Medical/Surgery History Past Medical History:  Diagnosis Date  . Alcohol abuse    in the past  . Anxiety   . Cardiomyopathy ([place])    hx of   Past Surgical History:  Procedure Laterality Date  . APPENDECTOMY    . HERNIA REPAIR    . 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 TOTAL HIP ARTHROPLASTY Right 11/12/2017   Procedure: RIGHT TOTAL HIP ARTHROPLASTY ANTERIOR APPROACH;  Surgeon: Leandrew Koyanagi, MD;  Location: [place];  Service: Orthopedics;  Laterality:  Right;     A IV Location/Drains/Wounds Patient Lines/Drains/Airways Status   Active Line/Drains/Airways    Name:   Placement date:   Placement time:   Site:   Days:   Peripheral IV 07/18/19 Right Wrist   07/18/19    1505    Wrist   1   External Urinary Catheter   06/27/19    2114    -   22   Airway   11/12/17    1002     614   Incision 10/15/13 Hip Left   10/15/13    1411     2103   Incision (Closed) 11/12/17 Hip Right   11/12/17    1336     614          Intake/Output Last 24 hours No intake or output data in the 24 hours ending 07/19/19 0013  Labs/Imaging Results for orders placed or performed during the hospital encounter of 07/18/19 (from the past 48 hour(s))  Comprehensive metabolic panel     Status: Abnormal   Collection Time: 07/18/19  3:10 PM  Result Value Ref Range   Sodium 140 135 - 145 mmol/L   Potassium 4.0 3.5 - 5.1 mmol/L   Chloride 101 98 - 111 mmol/L   CO2 18 (L) 22 - 32 mmol/L   Glucose, Bld 110 (H) 70 - 99 mg/dL   BUN 6 6 - 20 mg/dL   Creatinine, Ser 0.73 0.61 - 1.24 mg/dL  Calcium 8.8 (L) 8.9 - 10.3 mg/dL   Total Protein 7.0 6.5 - 8.1 g/dL   Albumin 4.2 3.5 - 5.0 g/dL   AST 130103 (H) 15 - 41 U/L   ALT 50 (H) 0 - 44 U/L   Alkaline Phosphatase 69 38 - 126 U/L   Total Bilirubin 0.9 0.3 - 1.2 mg/dL   GFR calc non Af Amer >60 >60 mL/min   GFR calc Af Amer >60 >60 mL/min   Anion gap 21 (H) 5 - 15    Comment: Performed at [facility], [address] [place], [place] [address]  CBC with Differential     Status: Abnormal   Collection Time: 07/18/19  3:10 PM  Result Value Ref Range   WBC 4.3 4.0 - 10.5 K/uL   RBC 4.32 4.22 - 5.81 MIL/uL   Hemoglobin 13.7 13.0 - 17.0 g/dL   HCT 46.941.0 62.939.0 - 52.852.0 %   MCV 94.9 80.0 - 100.0 fL   MCH 31.7 26.0 - 34.0 pg   MCHC 33.4 30.0 - 36.0 g/dL   RDW 41.314.5 24.411.5 - 01.015.5 %   Platelets 129 (L) 150 - 400 K/uL   nRBC 0.0 0.0 - 0.2 %   Neutrophils Relative % 43 %   Neutro Abs 1.8 1.7 - 7.7 K/uL   Lymphocytes Relative 44  %   Lymphs Abs 1.9 0.7 - 4.0 K/uL   Monocytes Relative 10 %   Monocytes Absolute 0.4 0.1 - 1.0 K/uL   Eosinophils Relative 2 %   Eosinophils Absolute 0.1 0.0 - 0.5 K/uL   Basophils Relative 1 %   Basophils Absolute 0.1 0.0 - 0.1 K/uL   Immature Granulocytes 0 %   Abs Immature Granulocytes 0.01 0.00 - 0.07 K/uL    Comment: Performed at [facility], [address] [place], [place] [address]  CK     Status: None   Collection Time: 07/18/19  3:10 PM  Result Value Ref Range   Total CK 127 49 - 397 U/L    Comment: Performed at [facility], [address] [place], [place] [address]  Magnesium     Status: Abnormal   Collection Time: 07/18/19  3:10 PM  Result Value Ref Range   Magnesium 1.5 (L) 1.7 - 2.4 mg/dL    Comment: Performed at [facility], [address] [place], [place] [address]  Ammonia     Status: Abnormal   Collection Time: 07/18/19  3:17 PM  Result Value Ref Range   Ammonia 59 (H) 9 - 35 umol/L    Comment: Performed at [facility], [address] [place], [place] [address]  Ethanol     Status: Abnormal   Collection Time: 07/18/19  3:30 PM  Result Value Ref Range   Alcohol, Ethyl (B) 304 (HH) <10 mg/dL    Comment: CRITICAL RESULT CALLED TO, READ BACK BY AND VERIFIED WITH: BAIN, C RN @ 1629 ON 07/18/2019 BY TEMOCHE, H (NOTE) Lowest detectable limit for serum alcohol is 10 mg/dL. For medical purposes only. Performed at [facility], [address] [place], [place] [address]    Ct Head Wo Contrast  Result Date: 07/18/2019 CLINICAL DATA:  Seizure EXAM: CT HEAD WITHOUT CONTRAST TECHNIQUE: Contiguous axial images were obtained from the base of the skull through the vertex without intravenous contrast. COMPARISON:  06/27/2019 FINDINGS: Brain: No acute intracranial abnormality. Specifically, no hemorrhage, hydrocephalus, mass lesion, acute infarction, or significant intracranial injury.  Vascular: No hyperdense vessel or unexpected  calcification. Skull: No acute calvarial abnormality. Sinuses/Orbits: No acute finding Other: None IMPRESSION: No intracranial abnormality. Electronically Signed   By: Charlett NoseKevin  Dover M.D.   On: 07/18/2019 16:06    Pending Labs Unresulted Labs (From admission, onward)    Start     Ordered   07/25/19 0500  Creatinine, serum  (enoxaparin (LOVENOX)    CrCl >/= 30 ml/min)  Weekly,   R    Comments: while on enoxaparin therapy    07/18/19 2231   07/19/19 0500  CBC  Tomorrow morning,   R     07/18/19 2231   07/19/19 0500  Comprehensive metabolic panel  Tomorrow morning,   R     07/18/19 2231   07/18/19 1903  SARS CORONAVIRUS 2 Nasal Swab Aptima Multi Swab  (Asymptomatic/Tier 2 Patients Labs)  Once,   STAT    Question Answer Comment  Is this test for diagnosis or screening Screening   Symptomatic for COVID-19 as defined by CDC No   Hospitalized for COVID-19 No   Admitted to ICU for COVID-19 No   Previously tested for COVID-19 Yes   Resident in a congregate (group) care setting No   Employed in healthcare setting No      07/18/19 1902          Vitals/Pain Today's Vitals   07/18/19 2115 07/18/19 2258 07/18/19 2300 07/18/19 2315  BP: (!) 116/95 113/72 112/77   Pulse: 86  91   Resp:   17   Temp:      TempSrc:      SpO2: 100%  92%   Weight:      Height:      PainSc:    0-No pain    Isolation Precautions No active isolations  Medications Medications  LORazepam (ATIVAN) injection 0-4 mg (0 mg Intravenous Not Given 07/18/19 2257)    Or  LORazepam (ATIVAN) tablet 0-4 mg ( Oral See Alternative 07/18/19 2257)  LORazepam (ATIVAN) injection 0-4 mg (1 mg Intravenous Given 07/18/19 1920)    Or  LORazepam (ATIVAN) tablet 0-4 mg ( Oral See Alternative 07/18/19 1920)  thiamine (VITAMIN B-1) tablet 100 mg (100 mg Oral Given 07/18/19 1714)    Or  thiamine (B-1) injection 100 mg ( Intravenous See Alternative 07/18/19 1714)  citalopram (CELEXA) tablet 10 mg (10 mg Oral Given 07/18/19 2323)   lactulose (CHRONULAC) 10 GM/15ML solution 30 g (30 g Oral Given 07/18/19 2323)  acetaminophen (TYLENOL) tablet 500 mg (has no administration in time range)  traZODone (DESYREL) tablet 50 mg (has no administration in time range)  enoxaparin (LOVENOX) injection 40 mg (has no administration in time range)  ondansetron (ZOFRAN) tablet 4 mg (has no administration in time range)    Or  ondansetron (ZOFRAN) injection 4 mg (has no administration in time range)  magnesium sulfate IVPB 2 g 50 mL (0 g Intravenous Stopped 07/18/19 2034)  lacosamide (VIMPAT) tablet 100 mg (100 mg Oral Given 07/18/19 2111)  sodium chloride 0.9 % 1,000 mL with thiamine 100 mg, folic acid 1 mg, multivitamins adult 10 mL infusion ( Intravenous New Bag/Given 07/18/19 2327)    Mobility walks High fall risk   Focused Assessments Neuro Assessment Handoff:  Swallow screen pass? Yes  Cardiac Rhythm: Normal sinus rhythm       Neuro Assessment: Within Defined Limits Neuro Checks:      Last Documented NIHSS Modified Score:   Has TPA been given? No If patient is a  Neuro Trauma and patient is going to OR before floor call report to 4N Charge nurse: [phone] or [phone]     R Recommendations: See Admitting Provider Note  Report given to:   Additional Notes:  Bedside commode.

## 2019-07-19 NOTE — ED Notes (Signed)
Pt keeps removing monitor equipment.

## 2019-07-19 NOTE — ED Notes (Signed)
Patient vomited; tremors present, pt mildly tach. MD notified.

## 2019-07-19 NOTE — ED Notes (Signed)
Pt able to ambulate to BR without gross need for (A).  Some incontinence of stool noted. on sheets.  Sheets changed.

## 2019-07-20 ENCOUNTER — Inpatient Hospital Stay (HOSPITAL_COMMUNITY): Payer: Medicare HMO

## 2019-07-20 DIAGNOSIS — I5042 Chronic combined systolic (congestive) and diastolic (congestive) heart failure: Secondary | ICD-10-CM

## 2019-07-20 DIAGNOSIS — F10129 Alcohol abuse with intoxication, unspecified: Secondary | ICD-10-CM

## 2019-07-20 DIAGNOSIS — E876 Hypokalemia: Secondary | ICD-10-CM

## 2019-07-20 DIAGNOSIS — R55 Syncope and collapse: Secondary | ICD-10-CM

## 2019-07-20 LAB — COMPREHENSIVE METABOLIC PANEL
ALT: 58 U/L — ABNORMAL HIGH (ref 0–44)
AST: 124 U/L — ABNORMAL HIGH (ref 15–41)
Albumin: 3.7 g/dL (ref 3.5–5.0)
Alkaline Phosphatase: 72 U/L (ref 38–126)
Anion gap: 10 (ref 5–15)
BUN: 7 mg/dL (ref 6–20)
CO2: 21 mmol/L — ABNORMAL LOW (ref 22–32)
Calcium: 8.4 mg/dL — ABNORMAL LOW (ref 8.9–10.3)
Chloride: 103 mmol/L (ref 98–111)
Creatinine, Ser: 0.74 mg/dL (ref 0.61–1.24)
GFR calc Af Amer: >60 mL/min (ref >60)
GFR calc non Af Amer: >60 mL/min (ref >60)
Glucose, Bld: 94 mg/dL (ref 70–99)
Potassium: 3.1 mmol/L — ABNORMAL LOW (ref 3.5–5.1)
Sodium: 134 mmol/L — ABNORMAL LOW (ref 135–145)
Total Bilirubin: 1 mg/dL (ref 0.3–1.2)
Total Protein: 5.9 g/dL — ABNORMAL LOW (ref 6.5–8.1)

## 2019-07-20 LAB — MAGNESIUM: Magnesium: 1.8 mg/dL (ref 1.7–2.4)

## 2019-07-20 LAB — CBC
HCT: 34.9 % — ABNORMAL LOW (ref 39.0–52.0)
Hemoglobin: 11.9 g/dL — ABNORMAL LOW (ref 13.0–17.0)
MCH: 31.9 pg (ref 26.0–34.0)
MCHC: 34.1 g/dL (ref 30.0–36.0)
MCV: 93.6 fL (ref 80.0–100.0)
Platelets: 94 10*3/uL — ABNORMAL LOW (ref 150–400)
RBC: 3.73 MIL/uL — ABNORMAL LOW (ref 4.22–5.81)
RDW: 13.6 % (ref 11.5–15.5)
WBC: 4.8 10*3/uL (ref 4.0–10.5)
nRBC: 0 % (ref 0.0–0.2)

## 2019-07-20 LAB — PHOSPHORUS: Phosphorus: 3.7 mg/dL (ref 2.5–4.6)

## 2019-07-20 LAB — ECHOCARDIOGRAM COMPLETE
Height: 68 in
Weight: 3072 oz

## 2019-07-20 LAB — HEPATITIS PANEL, ACUTE
HCV Ab: 0.2 s/co ratio (ref 0.0–0.9)
Hep A IgM: NEGATIVE
Hep B C IgM: NEGATIVE
Hepatitis B Surface Ag: NEGATIVE

## 2019-07-20 MED ORDER — LACOSAMIDE 50 MG PO TABS: 50.0000 mg | ORAL_TABLET | Freq: Two times a day (BID) | ORAL | Status: DC

## 2019-07-20 MED ORDER — MAGNESIUM SULFATE 2 GM/50ML IV SOLN: 2.0000 g | Freq: Once | INTRAVENOUS | Status: DC

## 2019-07-20 MED ORDER — ADULT MULTIVITAMIN W/MINERALS CH: ORAL_TABLET | ORAL | 1 refills | Status: AC

## 2019-07-20 MED ORDER — MAGNESIUM SULFATE IN D5W 1-5 GM/100ML-% IV SOLN
1.0000 g | Freq: Once | INTRAVENOUS | Status: AC
  Administered 2019-07-20: 1 g via INTRAVENOUS
  Filled 2019-07-20: qty 100

## 2019-07-20 MED ORDER — LACOSAMIDE 50 MG PO TABS: 100.0000 mg | ORAL_TABLET | Freq: Two times a day (BID) | ORAL | Status: DC

## 2019-07-20 MED ORDER — CHLORDIAZEPOXIDE HCL 25 MG PO CAPS: ORAL_CAPSULE | ORAL | 0 refills | Status: DC

## 2019-07-20 MED ORDER — CARBAMAZEPINE 200 MG PO TABS
200.0000 mg | ORAL_TABLET | Freq: Two times a day (BID) | ORAL | Status: DC
  Filled 2019-07-20: qty 1

## 2019-07-20 MED ORDER — LACOSAMIDE 50 MG PO TABS: ORAL_TABLET | ORAL | 0 refills | Status: DC

## 2019-07-20 MED ORDER — ARIPIPRAZOLE 10 MG PO TABS: 10.0000 mg | ORAL_TABLET | Freq: Every day | ORAL | Status: DC

## 2019-07-20 NOTE — TOC Transition Note (Signed)
Transition of Care [facility]) - CM/SW Discharge Note   Patient Details  Name: [name] MRN: [id number] Date of Birth: May 03, 1961  [facility] CM/SW Contact:  Pollie Friar, RN Phone Number: 07/20/2019, 1:33 PM   Clinical Narrative:    Pt discharging home with self care. No f/u per PT. No DME needs.  Pt starting on Vimpat. CM provided him 30 day free card. Pt to f/u with his pharmacy on cost after the 30 days.  Pt has transportation home.   Final next level of care: Home/Self Care Barriers to Discharge: No Barriers Identified   Patient Goals and CMS Choice        Discharge Placement                       Discharge Plan and Services   Discharge Planning Services: CM Consult                                 Social Determinants of Health (SDOH) Interventions     Readmission Risk Interventions No flowsheet data found.

## 2019-07-20 NOTE — Discharge Instructions (Signed)
Seizure, Adult °A seizure is a sudden burst of abnormal electrical activity in the brain. Seizures usually last from 30 seconds to 2 minutes. They can cause many different symptoms. °Usually, seizures are not harmful unless they last a long time. °What are the causes? °Common causes of this condition include: °· Fever or infection. °· Conditions that affect the brain, such as: °? A brain abnormality that you were born with. °? A brain or head injury. °? Bleeding in the brain. °? A tumor. °? Stroke. °? Brain disorders such as autism or cerebral palsy. °· Low blood sugar. °· Conditions that are passed from parent to child (are inherited). °· Problems with substances, such as: °? Having a reaction to a drug or a medicine. °? Suddenly stopping the use of a substance (withdrawal). °In some cases, the cause may not be known. A person who has repeated seizures over time without a clear cause has a condition called epilepsy. °What increases the risk? °You are more likely to get this condition if you have: °· A family history of epilepsy. °· Had a seizure in the past. °· A brain disorder. °· A history of head injury, lack of oxygen at birth, or strokes. °What are the signs or symptoms? °There are many types of seizures. The symptoms vary depending on the type of seizure you have. Examples of symptoms during a seizure include: °· Shaking (convulsions). °· Stiffness in the body. °· Passing out (losing consciousness). °· Head nodding. °· Staring. °· Not responding to sound or touch. °· Loss of bladder control and bowel control. °Some people have symptoms right before and right after a seizure happens. °Symptoms before a seizure may include: °· Fear. °· Worry (anxiety). °· Feeling like you may vomit (nauseous). °· Feeling like the room is spinning (vertigo). °· Feeling like you saw or heard something before (déjà vu). °· Odd tastes or smells. °· Changes in how you see. You may see flashing lights or spots. °Symptoms after a  seizure happens can include: °· Confusion. °· Sleepiness. °· Headache. °· Weakness on one side of the body. °How is this treated? °Most seizures will stop on their own in under 5 minutes. In these cases, no treatment is needed. Seizures that last longer than 5 minutes will usually need treatment. Treatment can include: °· Medicines given through an IV tube. °· Avoiding things that are known to cause your seizures. These can include medicines that you take for another condition. °· Medicines to treat epilepsy. °· Surgery to stop the seizures. This may be needed if medicines do not help. °Follow these instructions at home: °Medicines °· Take over-the-counter and prescription medicines only as told by your doctor. °· Do not eat or drink anything that may keep your medicine from working, such as alcohol. °Activity °· Do not do any activities that would be dangerous if you had another seizure, like driving or swimming. Wait until your doctor says it is safe for you to do them. °· If you live in the [place] ask your local DMV (department of motor vehicles) when you can drive. °· Get plenty of rest. °Teaching others °Teach friends and family what to do when you have a seizure. They should: °· Lay you on the ground. °· Protect your head and body. °· Loosen any tight clothing around your neck. °· Turn you on your side. °· Not hold you down. °· Not put anything into your mouth. °· Know whether or not you need emergency care. °· Stay   with you until you are better.  General instructions  Contact your doctor each time you have a seizure.  Avoid anything that gives you seizures.  Keep a seizure diary. Write down: ? What you think caused each seizure. ? What you remember about each seizure.  Keep all follow-up visits as told by your doctor. This is important. Contact a doctor if:  You have another seizure.  You have seizures more often.  There is any change in what happens during your seizures.  You keep having  seizures with treatment.  You have symptoms of being sick or having an infection. Get help right away if:  You have a seizure that: ? Lasts longer than 5 minutes. ? Is different than seizures you had before. ? Makes it harder to breathe. ? Happens after you hurt your head.  You have any of these symptoms after a seizure: ? Not being able to speak. ? Not being able to use a part of your body. ? Confusion. ? A bad headache.  You have two or more seizures in a row.  You do not wake up right after a seizure.  You get hurt during a seizure. These symptoms may be an emergency. Do not wait to see if the symptoms will go away. Get medical help right away. Call your local emergency services (911 in the [place] Do not drive yourself to the hospital. Summary  Seizures usually last from 30 seconds to 2 minutes. Usually, they are not harmful unless they last a long time.  Do not eat or drink anything that may keep your medicine from working, such as alcohol.  Teach friends and family what to do when you have a seizure.  Contact your doctor each time you have a seizure. This information is not intended to replace advice given to you by your health care provider. Make sure you discuss any questions you have with your health care provider. Document Released: 04/29/2008 Document Revised: 01/29/2019 Document Reviewed: 01/29/2019 Elsevier Patient Education  2020 [employer]   Alcohol Withdrawal Syndrome When a person who drinks a lot of alcohol stops drinking, he or she may have unpleasant and serious symptoms. These symptoms are called alcohol withdrawal syndrome. This condition may be mild or severe. It can be life-threatening. It can cause:  Shaking that you cannot control (tremor).  Sweating.  Headache.  Feeling fearful, upset, grouchy, or depressed.  Trouble sleeping (insomnia).  Nightmares.  Fast or uneven heartbeats (palpitations).  Alcohol cravings.  Feeling sick to  your stomach (nausea).  Throwing up (vomiting).  Being bothered by light and sounds.  Confusion.  Trouble thinking clearly.  Not being hungry (loss of appetite).  Big changes in mood (mood swings). If you have all of the following symptoms at the same time, get help right away:  High blood pressure.  Fast heartbeat.  Trouble breathing.  Seizures.  Seeing, hearing, feeling, smelling, or tasting things that are not there (hallucinations). These symptoms are known as delirium tremens (DTs). They must be treated at the hospital right away. Follow these instructions at home:   Take over-the-counter and prescription medicines only as told by your doctor. This includes vitamins.  Do not drink alcohol.  Do not drive until your doctor says that this is safe for you.  Have someone stay with you or be available in case you need help. This should be someone you trust. This person can help you with your symptoms. He or she can also help you to  not drink.  Drink enough fluid to keep your pee (urine) pale yellow.  Think about joining a support group or a treatment program to help you stop drinking.  Keep all follow-up visits as told by your doctor. This is important. Contact a doctor if:  Your symptoms get worse.  You cannot eat or drink without throwing up.  You have a hard time not drinking alcohol.  You cannot stop drinking alcohol. Get help right away if:  You have fast or uneven heartbeats.  You have chest pain.  You have trouble breathing.  You have a seizure for the first time.  You see, hear, feel, smell, or taste something that is not there.  You get very confused. Summary  When a person who drinks a lot of alcohol stops drinking, he or she may have serious symptoms. This is called alcohol withdrawal syndrome.  Delirium tremens (DTs) is a group of life-threatening symptoms. You should get help right away if you have these symptoms.  Think about joining  an alcohol support group or a treatment program. This information is not intended to replace advice given to you by your health care provider. Make sure you discuss any questions you have with your health care provider. Document Released: 04/29/2008 Document Revised: 10/24/2017 Document Reviewed: 07/18/2017 Elsevier Patient Education  2020 [employer].

## 2019-07-20 NOTE — Evaluation (Signed)
Occupational Therapy Evaluation Patient Details Name: [name] MRN: [id number] DOB: 03-08-1961 Today's Date: 07/20/2019    History of Present Illness 58 year old man with history of alcohol abuse admitted with multiple seizures. PMhx: anxiety, cardiomyopathy, bil THA   Clinical Impression   Patient evaluated by Occupational Therapy with no further acute OT needs identified. All education has been completed and the patient has no further questions. See below for any follow-up Occupational Therapy or equipment needs. OT to sign off. Thank you for referral.   Pt encouraged to have brother (A) with initial return home with his medications. Explained that if he can teach his brother the medication schedule then he will understand it better too. Pt reports "I only go in this mess because I tried to call for 3 days to get my medication and no one would call me back" Pt states "my brother lives next door so I can ask him if I need to"    Follow Up Recommendations  No OT follow up    Equipment Recommendations  None recommended by OT    Recommendations for Other Services       Precautions / Restrictions Precautions Precautions: Fall Precaution Comments: reports at least 3 falls in past 6 mos Restrictions Weight Bearing Restrictions: No      Mobility Bed Mobility Overal bed mobility: Modified Independent             General bed mobility comments: in chair on arrival  Transfers Overall transfer level: Modified independent               General transfer comment: no dme    Balance Overall balance assessment: No apparent balance deficits (not formally assessed)                                         ADL either performed or assessed with clinical judgement   ADL Overall ADL's : Modified independent                                       General ADL Comments: completed tub transfer with chair like home, completed recliner use,  transfer, ambulation to gym, and setup of of all meal without issue. pt able to removed blanket from his toes to show abilityt o reach feet     Vision         Perception     Praxis      Pertinent Vitals/Pain Pain Assessment: No/denies pain     Hand Dominance Right   Extremity/Trunk Assessment Upper Extremity Assessment Upper Extremity Assessment: Overall WFL for tasks assessed(tremors noted pt reports it is new then says no its not)   Lower Extremity Assessment Lower Extremity Assessment: Overall WFL for tasks assessed   Cervical / Trunk Assessment Cervical / Trunk Assessment: Normal   Communication Communication Communication: No difficulties   Cognition Arousal/Alertness: Awake/alert Behavior During Therapy: WFL for tasks assessed/performed Overall Cognitive Status: Within Functional Limits for tasks assessed                                     General Comments       Exercises     Shoulder Instructions      Home Living Family/patient  expects to be discharged to:: Private residence Living Arrangements: Alone Available Help at Discharge: Family;Available PRN/intermittently Type of Home: House Home Access: Stairs to enter [employer] of Steps: 6 Entrance Stairs-Rails: Right;Left;Can reach both Home Layout: One level     Bathroom Shower/Tub: Teacher, early years/pre: Standard Bathroom Accessibility: Yes   Home Equipment: Environmental consultant - 2 wheels;Cane - single point;Bedside commode   Additional Comments: reports he lost his wife mother and brother in the same month and he has been going through some things. the patient reports this event occurred year and half ago      Prior Functioning/Environment Level of Independence: Independent        Comments: uses RW in home; cane outside        OT Problem List:        OT Treatment/Interventions:      OT Goals(Current goals can be found in the care plan section) Acute Rehab  OT Goals Patient Stated Goal: to go back home  OT Frequency:     Barriers to D/C:            Co-evaluation              AM-PAC OT "6 Clicks" Daily Activity     Outcome Measure Help from another person eating meals?: None Help from another person taking care of personal grooming?: None Help from another person toileting, which includes using toliet, bedpan, or urinal?: None Help from another person bathing (including washing, rinsing, drying)?: None Help from another person to put on and taking off regular upper body clothing?: None Help from another person to put on and taking off regular lower body clothing?: None 6 Click Score: 24   End of Session Nurse Communication: Mobility status;Precautions  Activity Tolerance: Patient tolerated treatment well Patient left: in chair;with call bell/phone within reach;with chair alarm set  OT Visit Diagnosis: Unsteadiness on feet (R26.81)                Time: 4709-6283 OT Time Calculation (min): 15 min Charges:  OT General Charges $OT Visit: 1 Visit OT Evaluation $OT Eval Low Complexity: 1 Low   Jeri Modena, OTR/L  Acute Rehabilitation Services Pager: [phone] Office: [phone] .   Jeri Modena 07/20/2019, 1:25 PM

## 2019-07-20 NOTE — Progress Notes (Signed)
  Echocardiogram 2D Echocardiogram has been performed.  [name] 07/20/2019, 10:32 AM

## 2019-07-20 NOTE — Progress Notes (Signed)
Pt d/c home to self care, pt has no new concerns and on room air. D/C instructions done with teach back. Pt verbalize understanding.

## 2019-07-20 NOTE — TOC Initial Note (Signed)
Transition of Care [facility]) - Initial/Assessment Note    Patient Details  Name: [name] MRN: [id number] Date of Birth: Aug 28, 1961  [facility] of [facility]) CM/SW Contact:    Pollie Friar, RN Phone Number: 07/20/2019, 1:32 PM  Clinical Narrative:                   Expected Discharge Plan: Home/Self Care Barriers to Discharge: No Barriers Identified   Patient Goals and CMS Choice        Expected Discharge Plan and Services Expected Discharge Plan: Home/Self Care   Discharge Planning Services: CM Consult     Expected Discharge Date: 07/20/19                                    Prior Living Arrangements/Services   Lives with:: Self Patient language and need for interpreter reviewed:: Yes(no needs) Do you feel safe going back to the place where you live?: Yes      Need for Family Participation in Patient Care: No (Comment) Care giver support system in place?: Yes (comment)(brother lives next door and pt could stay with him as needed) Current home services: DME(cane/ walker) Criminal Activity/Legal Involvement Pertinent to Current Situation/Hospitalization: No - Comment as needed  Activities of Daily Living Home Assistive Devices/Equipment: [place] (specify quad or straight), Walker (specify type), Wheelchair ADL Screening (condition at time of admission) Patient's cognitive ability adequate to safely complete daily activities?: Yes Is the patient deaf or have difficulty hearing?: No Does the patient have difficulty seeing, even when wearing glasses/contacts?: No Does the patient have difficulty concentrating, remembering, or making decisions?: No Patient able to express need for assistance with ADLs?: No Does the patient have difficulty dressing or bathing?: No Independently performs ADLs?: Yes (appropriate for developmental age) Does the patient have difficulty walking or climbing stairs?: Yes Weakness of Legs: Both(intermittent) Weakness of Arms/Hands:  None  Permission Sought/Granted                  Emotional Assessment Appearance:: Appears stated age Attitude/Demeanor/Rapport: Engaged Affect (typically observed): Accepting, Pleasant Orientation: : Oriented to Place, Oriented to  Time, Oriented to Situation, Oriented to Self   Psych Involvement: No (comment)  Admission diagnosis:  Alcohol abuse [F10.10] Elevated liver enzymes [R74.8] Seizure-like activity ([place]) [R56.9] Patient Active Problem List   Diagnosis Date Noted  . Seizure disorder ([place]) 07/18/2019  . Hypomagnesemia 07/18/2019  . UTI (urinary tract infection) 07/08/2019  . Status epilepticus ([place]) 06/27/2019  . Alcohol withdrawal syndrome with complication ([place])   . Lactic acidosis   . Insomnia 05/23/2019  . Low back pain 04/27/2019  . Recurrent falls 04/27/2019  . Grief 04/27/2019  . Hyperglycemia 04/27/2019  . B12 deficiency 04/27/2019  . Vitamin D deficiency 04/27/2019  . Right hip pain 12/04/2017  . 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 Hip joint replacement status   . Preop exam for internal medicine 10/11/2017  . Pyuria 10/08/2017  . 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 Hypersomnolence 02/17/2015  . Syncope 02/16/2015  . Arthritis of right hip 09/05/2014  . Essential hypertension, benign 07/24/2014  . Encounter for well adult exam with abnormal findings 07/22/2014  . Anxiety and depression 07/22/2014  . Alcohol abuse counseling and surveillance 06/25/2014  . Left hip pain 05/07/2013   PCP:  Biagio Borg, MD Pharmacy:   [id number] - [place], [address]  [address] [place] [address] Phone: [phone] Fax: [phone]  [facility] [place] [address] [place] [address] Phone: [phone] Fax: [phone]     Social Determinants of Health (SDOH) Interventions    Readmission Risk Interventions No flowsheet data  found.

## 2019-07-20 NOTE — Discharge Summary (Signed)
Physician Discharge Summary  [name] ZOX:[id number] DOB: 1961/07/02 DOA: 07/18/2019  PCP: Shawn LevinsJohn, Shawn W, MD  Admit date: 07/18/2019 Discharge date: 07/20/2019  Admitted From: Home Disposition: Home  Recommendations for Outpatient Follow-up:  1. Follow up with PCP in 1-2 weeks 2. Recommend referral to neurology 3. Please obtain CBC/CMP/Mag at follow up 4. Encourage alcohol cessation. 5. Please follow up on the following pending results: None  Home Health: None recommended Equipment/Devices: None recommended  Discharge Condition: Stable CODE STATUS: Full code  Hospital Course: 58 y.o.malewith history of alcohol abuse, seizure disorder with recent hospitalization, diastolic CHF, osteoarthritis with frequent falls, medication noncompliance presenting from home after sustaining what appears to be a seizure versus syncope and admitted.    In ED, tachycardic and tachypneic.  CBC with thrombocytopenia at 129.  CMP with bicarb to 18 and anion gap to 21.  AST 103.  ALT 50.  Magnesium 1.5.  CK 127.  EtOH level 304.  CT head without contrast without acute finding.  COVID-19 negative.  EKG sinus rhythm with normal intervals. Admitted for seizure and possible syncope in setting of alcohol intoxication.  Per admitting provider, neurology recommended stopping Keppra and starting Vimpat which was not started.  However, patient remained seizure-free with PRN Ativan.  Patient was started on Vimpat later in the hospital course.  However, his co-pay was high at $200 for 1 month supply.  I have discussed this with neurologist, Dr. Otelia Smith who recommended restarting his Keppra at 500 mg twice daily.  Per neurology, not a good candidate for Depakote and Tegretol due to alcoholic hepatitis.  Patient was evaluated by PT/OT prior to discharge and no need was identified.  Discharge Diagnoses:  Seizure versus syncopal episode in the setting of alcohol intoxication -Reportedly took himself off  Keppra due to somnolence -Reports taking about 6 beers a day.  EtOH level elevated on arrival. -Per admitting provider, neurology recommended Vimpat  but patient was not started. -Patient remained seizure-free during hospitalization.  Plan was to start and discharged on Vimpat but his co-pay was not affordable.  He was discharged on home Keppra after discussion with neurology, Dr. Otelia Smith.  He was encouraged to quit drinking. -No events on telemetry.  EKG reassuring.  Echo as below. -Recommended not to drive or swim unless seizure-free for 6 months or cleared by neurology. -Recommend outpatient neurology follow-up-please refer. -Echocardiogram as below.  Alcohol use disorder/intoxication: CIWA score low at 2 for tremor which is chronic for him.  Denies history of DT.  -Counseled on quitting or moderation. -Multivitamins  Combined chronic systolic/diastolic CHF: Echo with EF of 81%50%, diastolic dysfunction and diffuse hypokinesis.  Likely due to alcohol.  Patient has no cardiopulmonary symptoms.  Appears euvolemic. -Consider repeat echocardiogram if he remains sober from alcohol. -Counseled on alcohol cessation. -Doubt need for diuretics at this time.  Tachycardia/tachypnea: Likely due to the above.  Resolved.  Elevated liver enzymes/ammonia: Likely due to alcohol use.  Mental status intact.  Hepatitis panel negative.  Liver ultrasound with fatty liver. -Continue lactulose 30g twice daily. -Repeat CMP at follow-up.  Anion gap metabolic acidosis: Resolved.  Likely due to alcohol location.  Hypomagnesemia: Likely due to alcohol.  Resolved.  Hypokalemia: Likely due to alcohol as well.  Replenished prior to discharge.  Hypertension: Normotensive.  Thrombocytopenia: Likely alcohol-related.  Medication noncompliance: Counseled.  History of depression: Stable.  -Continue home Celexa and trazodone  Discharge Instructions  Discharge Instructions    Call MD for:   Complete by:  As directed    Seizure-like symptoms.   Call MD for:  persistant dizziness or light-headedness   Complete by: As directed    Call MD for:  persistant nausea and vomiting   Complete by: As directed    Call MD for:  severe uncontrolled pain   Complete by: As directed    Call MD for:  temperature >100.4   Complete by: As directed    Diet general   Complete by: As directed    Discharge instructions   Complete by: As directed    It has been a pleasure taking care of you! You were admitted with seizure and alcohol intoxication.  Your seizure could be due to alcohol and not taking your seizure medication.  We strongly recommend taking your seizure medications as prescribed and quitting alcohol. We do not recommend driving or swimming unless you are seizure-free for 6 months. Please review your new medication list and the directions before you take your medications. Please call your primary care office as soon as possible to schedule hospital follow-up visit in 1 to 2 weeks.  Take care,   Increase activity slowly   Complete by: As directed      Allergies as of 07/20/2019   No Known Allergies     Medication List    TAKE these medications   acetaminophen 500 MG tablet Commonly known as: TYLENOL Take 1 tablet (500 mg total) by mouth every 6 (six) hours as needed for mild pain.   citalopram 20 MG tablet Commonly known as: CeleXA Take 0.5 tablets (10 mg total) by mouth at bedtime.   lactulose 10 GM/15ML solution Commonly known as: CHRONULAC Take 45 mLs (30 g total) by mouth 2 (two) times daily.   levETIRAcetam 500 MG tablet Commonly known as: KEPPRA Take 500 mg by mouth 2 (two) times daily.   multivitamin with minerals Tabs tablet Take one tablet daily   traZODone 50 MG tablet Commonly known as: DESYREL Take 0.5-1 tablets (25-50 mg total) by mouth at bedtime as needed for sleep. What changed: how much to take      Follow-up Information    Shawn Levins, MD. Schedule an  appointment as soon as possible for a visit in 1 week(s).   Specialties: Internal Medicine, Radiology Contact information: [address] Maggie Schwalbe [facility] [place] [address] [phone]           Consultations:  Neurology over the phone  Procedures/Studies:  2D Echo:  1. The left ventricle has a visually estimated ejection fraction of 50%. The cavity size was normal. Left ventricular diastolic Doppler parameters are consistent with impaired relaxation. Left ventricular diffuse hypokinesis.  2. The right ventricle has normal systolic function. The cavity was normal. There is no increase in right ventricular wall thickness.  3. The aortic valve is tricuspid. No stenosis of the aortic valve.  4. The aorta is abnormal unless otherwise noted.  5. There is mild dilatation of the aortic root measuring 36 mm.  6. Normal IVC size with PA systolic pressure 20 mmHg.  7. No evidence of mitral valve stenosis. No significant mitral regurgitation.  Ct Head Wo Contrast  Result Date: 07/18/2019 CLINICAL DATA:  Seizure EXAM: CT HEAD WITHOUT CONTRAST TECHNIQUE: Contiguous axial images were obtained from the base of the skull through the vertex without intravenous contrast. COMPARISON:  06/27/2019 FINDINGS: Brain: No acute intracranial abnormality. Specifically, no hemorrhage, hydrocephalus, mass lesion, acute infarction, or significant intracranial injury. Vascular: No hyperdense vessel or unexpected calcification.  Skull: No acute calvarial abnormality. Sinuses/Orbits: No acute finding Other: None IMPRESSION: No intracranial abnormality. Electronically Signed   By: Charlett NoseKevin  Dover M.D.   On: 07/18/2019 16:06   Ct Head Wo Contrast  Result Date: 06/27/2019 CLINICAL DATA:  Seizure.  Currently postictal EXAM: CT HEAD WITHOUT CONTRAST TECHNIQUE: Contiguous axial images were obtained from the base of the skull through the vertex without intravenous contrast. COMPARISON:  11/13/2017 FINDINGS: Brain: No evidence of  acute infarction, hemorrhage, hydrocephalus, extra-axial collection or mass lesion/mass effect. Vascular: No hyperdense vessel or unexpected calcification. Skull: Normal. Negative for fracture or focal lesion. Sinuses/Orbits: Bilateral maxillary sinus mucosal thickening. Other: None. IMPRESSION: 1. No acute intracranial abnormality.  Normal brain. Electronically Signed   By: Signa Kellaylor  Stroud M.D.   On: 06/27/2019 17:42   Mr Brain Wo Contrast  Result Date: 06/28/2019 CLINICAL DATA:  Acute presentation with seizure. Left arm and leg stiffness and shaking. EXAM: MRI HEAD WITHOUT CONTRAST TECHNIQUE: Multiplanar, multiecho pulse sequences of the brain and surrounding structures were obtained without intravenous contrast. COMPARISON:  Head CT 06/27/2019 FINDINGS: Brain: The study suffers from motion degradation. Diffusion imaging does show any acute or subacute infarction. No focal brainstem insult. Generalized cerebellar atrophy. Cerebral hemispheres show mild atrophy with mild to moderate chronic small-vessel ischemic changes of the deep white matter. No sign mass lesion, hemorrhage, hydrocephalus or extra-axial collection. No mesial temporal lesion is seen. Vascular: Major vessels at the base of the brain show flow. Skull and upper cervical spine: Negative Sinuses/Orbits: Clear/normal Other: None IMPRESSION: Atrophy. Chronic small-vessel ischemic changes of the white matter. No acute or reversible process. No cause of seizure identified. Study does suffer from significant motion degradation and is of "low resolution". Electronically Signed   By: Paulina FusiMark  Shogry M.D.   On: 06/28/2019 14:54   Dg Chest Portable 1 View  Result Date: 06/27/2019 CLINICAL DATA:  Seizure EXAM: PORTABLE CHEST 1 VIEW COMPARISON:  October 07, 2013 FINDINGS: No edema or consolidation. Heart is upper normal in size with pulmonary vascularity normal. No adenopathy. No pneumothorax. No bone lesions. IMPRESSION: No edema or consolidation.  Heart  upper normal in size. Electronically Signed   By: Bretta BangWilliam  Woodruff III M.D.   On: 06/27/2019 16:28   [place] Abdomen Limited Ruq  Result Date: 07/19/2019 CLINICAL DATA:  Elevated liver enzymes. EXAM: ULTRASOUND ABDOMEN LIMITED RIGHT UPPER QUADRANT COMPARISON:  CT 01/13/2017 report. FINDINGS: Gallbladder: No gallstones or wall thickening visualized. No sonographic Murphy sign noted by sonographer. Common bile duct: Diameter: 4.9 mm Liver: Heterogeneous hepatic parenchymal pattern suggesting fatty infiltration or hepatocellular disease. 0.9 mm probable cyst left hepatic lobe. Portal vein is patent on color Doppler imaging with normal direction of blood flow towards the liver. Other: None. IMPRESSION: 1.  No gallstones or biliary distention. 2.  0.9 cm probable cyst left hepatic lobe. Electronically Signed   By: Maisie Fushomas  Register   On: 07/19/2019 07:53      Subjective: No major events overnight of this morning.  No signs of withdrawal except for chronic tremors.  Denies audiovisual hallucination.  Denies chest pain, dyspnea, GI or GU symptoms.  Feels ready to go home.  He says he has a brother who lives next door. Planning to quit drinking.   Discharge Exam: Vitals:   07/20/19 0700 07/20/19 1100  BP: 106/79 (!) 119/93  Pulse: 80 (!) 105  Resp: 18 17  Temp: 98.6 F (37 C) 98.8 F (37.1 C)  SpO2: 99% 99%    GENERAL: No acute distress.  Appears well.  HEENT: MMM.  Vision and hearing grossly intact.  NECK: Supple.  No JVD.  LUNGS:  No IWOB. Good air movement bilaterally. HEART:  RRR. Heart sounds normal.  ABD: Bowel sounds present. Soft. Non tender.  MSK/EXT:  Moves all extremities. No apparent deformity. No edema bilaterally. SKIN: no apparent skin lesion or wound NEURO: Awake, alert and oriented appropriately.  No gross deficit.  Mild tremors in both hands PSYCH: Calm. Normal affect.     The results of significant diagnostics from this hospitalization (including imaging, microbiology,  ancillary and laboratory) are listed below for reference.     Microbiology: Recent Results (from the past 240 hour(s))  SARS CORONAVIRUS 2 Nasal Swab Aptima Multi Swab     Status: None   Collection Time: 07/18/19  7:03 PM   Specimen: Aptima Multi Swab; Nasal Swab  Result Value Ref Range Status   SARS Coronavirus 2 NEGATIVE NEGATIVE Final    Comment: (NOTE) SARS-CoV-2 target nucleic acids are NOT DETECTED. The SARS-CoV-2 RNA is generally detectable in upper and lower respiratory specimens during the acute phase of infection. Negative results do not preclude SARS-CoV-2 infection, do not rule out co-infections with other pathogens, and should not be used as the sole basis for treatment or other patient management decisions. Negative results must be combined with clinical observations, patient history, and epidemiological information. The expected result is Negative. Fact Sheet for Patients: SugarRoll.be Fact Sheet for Healthcare Providers: https://www.woods-mathews.com/ This test is not yet approved or cleared by the [place] FDA and  has been authorized for detection and/or diagnosis of SARS-CoV-2 by FDA under an Emergency Use Authorization (EUA). This EUA will remain  in effect (meaning this test can be used) for the duration of the COVID-19 declaration under Section 56 4(b)(1) of the Act, 21 U.S.C. section 360bbb-3(b)(1), unless the authorization is terminated or revoked sooner. Performed at [facility], [place] [address], [place], [place] [address]      Labs: BNP (last 3 results) No results for input(s): BNP in the last 8760 hours. Basic Metabolic Panel: Recent Labs  Lab 07/18/19 1510 07/19/19 0451 07/19/19 0950 07/20/19 0437  NA 140 139  --  134*  K 4.0 3.6  --  3.1*  CL 101 102  --  103  CO2 18* 23  --  21*  GLUCOSE 110* 92  --  94  BUN 6 6  --  7  CREATININE 0.73 0.73  --  0.74  CALCIUM 8.8* 8.4*  --  8.4*  MG  1.5*  --  1.6* 1.8  PHOS  --   --  4.2 3.7   Liver Function Tests: Recent Labs  Lab 07/18/19 1510 07/19/19 0451 07/20/19 0437  AST 103* 127* 124*  ALT 50* 51* 58*  ALKPHOS 69 70 72  BILITOT 0.9 0.8 1.0  PROT 7.0 6.5 5.9*  ALBUMIN 4.2 4.1 3.7   No results for input(s): LIPASE, AMYLASE in the last 168 hours. Recent Labs  Lab 07/18/19 1517  AMMONIA 59*   CBC: Recent Labs  Lab 07/18/19 1510 07/19/19 1016 07/20/19 0437  WBC 4.3 5.2 4.8  NEUTROABS 1.8  --   --   HGB 13.7 12.0* 11.9*  HCT 41.0 36.1* 34.9*  MCV 94.9 94.8 93.6  PLT 129* 100* 94*   Cardiac Enzymes: Recent Labs  Lab 07/18/19 1510  CKTOTAL 127   BNP: Invalid input(s): POCBNP CBG: No results for input(s): GLUCAP in the last 168 hours. D-Dimer No results for  input(s): DDIMER in the last 72 hours. Hgb A1c No results for input(s): HGBA1C in the last 72 hours. Lipid Profile No results for input(s): CHOL, HDL, LDLCALC, TRIG, CHOLHDL, LDLDIRECT in the last 72 hours. Thyroid function studies No results for input(s): TSH, T4TOTAL, T3FREE, THYROIDAB in the last 72 hours.  Invalid input(s): FREET3 Anemia work up No results for input(s): VITAMINB12, FOLATE, FERRITIN, TIBC, IRON, RETICCTPCT in the last 72 hours. Urinalysis    Component Value Date/Time   COLORURINE AMBER (A) 06/27/2019 1607   APPEARANCEUR HAZY (A) 06/27/2019 1607   LABSPEC 1.019 06/27/2019 1607   PHURINE 6.0 06/27/2019 1607   GLUCOSEU NEGATIVE 06/27/2019 1607   GLUCOSEU NEGATIVE 04/27/2019 0957   HGBUR SMALL (A) 06/27/2019 1607   BILIRUBINUR NEGATIVE 06/27/2019 1607   KETONESUR 5 (A) 06/27/2019 1607   PROTEINUR >=300 (A) 06/27/2019 1607   UROBILINOGEN 1.0 04/27/2019 0957   NITRITE POSITIVE (A) 06/27/2019 1607   LEUKOCYTESUR TRACE (A) 06/27/2019 1607   Sepsis Labs Invalid input(s): PROCALCITONIN,  WBC,  LACTICIDVEN   Time coordinating discharge: 35 minutes  SIGNED:  Almon Hercules, MD  [facility] 07/20/2019, 4:49 PM  If  7PM-7AM, please contact night-coverage [email or web address] Password [email or web address]

## 2019-07-20 NOTE — Evaluation (Signed)
Physical Therapy Evaluation/ Discharge Patient Details Name: [name] MRN: [id number] DOB: 06/03/1961 Today's Date: 07/20/2019   History of Present Illness  58 year old man with history of alcohol abuse admitted with multiple seizures. PMhx: anxiety, cardiomyopathy, bil THA  Clinical Impression  Pt pleasant and reports he no longer uses RW for gait and has had a few falls in the last year with the latest related to walking in the dark and pt has since placed night lights for use. Pt with steady gait, no difficulty with stairs or transfers and currently at baseline functional level. No further therapy needs at this time with pt aware and agreeable.      Follow Up Recommendations No PT follow up    Equipment Recommendations  None recommended by PT    Recommendations for Other Services       Precautions / Restrictions Precautions Precautions: Fall      Mobility  Bed Mobility Overal bed mobility: Modified Independent                Transfers Overall transfer level: Modified independent                  Ambulation/Gait Ambulation/Gait assistance: Independent Gait Distance (Feet): 350 Feet Assistive device: None Gait Pattern/deviations: Step-through pattern;Decreased stride length   Gait velocity interpretation: >4.37 ft/sec, indicative of normal walking speed General Gait Details: pt with slightly slow gait but able to increase and decrease gait speed, perform vertical and horizontal head turns and complete gait without UE support  Stairs Stairs: Yes Stairs assistance: Modified independent (Device/Increase time) Stair Management: One rail Left;Alternating pattern;Forwards Number of Stairs: 6 General stair comments: steady with use of rail  Wheelchair Mobility    Modified Rankin (Stroke Patients Only)       Balance Overall balance assessment: No apparent balance deficits (not formally assessed)                                            Pertinent Vitals/Pain Pain Assessment: No/denies pain    Home Living Family/patient expects to be discharged to:: Private residence Living Arrangements: Alone Available Help at Discharge: Family;Available PRN/intermittently Type of Home: House Home Access: Stairs to enter Entrance Stairs-Rails: Right;Left;Can reach both Entrance Stairs-Number of Steps: 6 Home Layout: One level Home Equipment: Walker - 2 wheels;Cane - single point;Bedside commode      Prior Function Level of Independence: Independent               Hand Dominance        Extremity/Trunk Assessment   Upper Extremity Assessment Upper Extremity Assessment: Overall WFL for tasks assessed    Lower Extremity Assessment Lower Extremity Assessment: Overall WFL for tasks assessed    Cervical / Trunk Assessment Cervical / Trunk Assessment: Normal  Communication   Communication: No difficulties  Cognition Arousal/Alertness: Awake/alert Behavior During Therapy: WFL for tasks assessed/performed Overall Cognitive Status: Within Functional Limits for tasks assessed                                        General Comments      Exercises     Assessment/Plan    PT Assessment Patent does not need any further PT services  PT Problem List  PT Treatment Interventions      PT Goals (Current goals can be found in the Care Plan section)  Acute Rehab PT Goals PT Goal Formulation: All assessment and education complete, DC therapy    Frequency     Barriers to discharge        Co-evaluation               AM-PAC PT "6 Clicks" Mobility  Outcome Measure Help needed turning from your back to your side while in a flat bed without using bedrails?: None Help needed moving from lying on your back to sitting on the side of a flat bed without using bedrails?: None Help needed moving to and from a bed to a chair (including a wheelchair)?: None Help needed standing up from  a chair using your arms (e.g., wheelchair or bedside chair)?: None Help needed to walk in hospital room?: None Help needed climbing 3-5 steps with a railing? : None 6 Click Score: 24    End of Session Equipment Utilized During Treatment: Gait belt Activity Tolerance: Patient tolerated treatment well Patient left: in chair;with call bell/phone within reach;with chair alarm set Nurse Communication: Mobility status PT Visit Diagnosis: Other abnormalities of gait and mobility (R26.89)    Time: [id number] PT Time Calculation (min) (ACUTE ONLY): 17 min   Charges:   PT Evaluation $PT Eval Low Complexity: 1 Low          Alivya Wegman Abner Greenspanabor Izaiha Lo, PT Acute Rehabilitation Services Pager: [phone] Office: [phone]   Tifini Reeder B Katrine Radich 07/20/2019, 1:10 PM

## 2019-07-20 NOTE — Progress Notes (Signed)
[name] informed of pt's potassium 3.1.

## 2019-07-21 ENCOUNTER — Telehealth: Payer: Self-pay | Admitting: *Deleted

## 2019-07-21 NOTE — Telephone Encounter (Signed)
Transition Care Management Follow-up Telephone Call  How have you been since you were released from the hospital? I am feeling much better   Do you understand why you were in the hospital? yes   Do you understand the discharge instrcutions? yes  Items Reviewed:  Medications reviewed: yes  Allergies reviewed: yes  Dietary changes reviewed: yes  Referrals reviewed: yes   Functional Questionnaire:   Activities of Daily Living (ADLs):   He states they are independent in the following: ambulation, bathing and hygiene, feeding, continence, grooming, toileting and dressing States they require assistance with the following: none   Any transportation issues/concerns?: yes, due to seizures he cannot drive and will need to get someone to drive him to the appointment. Patient will call back if this is an issue to convert visit to virtual or telephone.    Any patient concerns? no   Confirmed importance and date/time of follow-up visits scheduled: yes, 07/28/19 @ 1:00   Confirmed with patient if condition begins to worsen call PCP or go to the ER.  Patient was given the Call-a-Nurse line [phone]: yes

## 2019-07-28 ENCOUNTER — Encounter: Payer: Self-pay | Admitting: Internal Medicine

## 2019-07-28 ENCOUNTER — Other Ambulatory Visit (INDEPENDENT_AMBULATORY_CARE_PROVIDER_SITE_OTHER): Payer: Medicare HMO

## 2019-07-28 ENCOUNTER — Ambulatory Visit (INDEPENDENT_AMBULATORY_CARE_PROVIDER_SITE_OTHER): Payer: Medicare HMO | Admitting: Internal Medicine

## 2019-07-28 ENCOUNTER — Other Ambulatory Visit: Payer: Self-pay

## 2019-07-28 VITALS — BP 128/86 | HR 104 | Temp 98.4°F | Ht 68.0 in | Wt 198.0 lb

## 2019-07-28 DIAGNOSIS — M79671 Pain in right foot: Secondary | ICD-10-CM

## 2019-07-28 DIAGNOSIS — G40901 Epilepsy, unspecified, not intractable, with status epilepticus: Secondary | ICD-10-CM | POA: Diagnosis not present

## 2019-07-28 DIAGNOSIS — I5042 Chronic combined systolic (congestive) and diastolic (congestive) heart failure: Secondary | ICD-10-CM

## 2019-07-28 DIAGNOSIS — Z7141 Alcohol abuse counseling and surveillance of alcoholic: Secondary | ICD-10-CM | POA: Diagnosis not present

## 2019-07-28 LAB — CBC WITH DIFFERENTIAL/PLATELET
Basophils Absolute: 0 10*3/uL (ref 0.0–0.1)
Basophils Relative: 0.6 % (ref 0.0–3.0)
Eosinophils Absolute: 0.2 10*3/uL (ref 0.0–0.7)
Eosinophils Relative: 2.4 % (ref 0.0–5.0)
HCT: 36.8 % — ABNORMAL LOW (ref 39.0–52.0)
Hemoglobin: 12.3 g/dL — ABNORMAL LOW (ref 13.0–17.0)
Lymphocytes Relative: 23.8 % (ref 12.0–46.0)
Lymphs Abs: 1.6 10*3/uL (ref 0.7–4.0)
MCHC: 33.5 g/dL (ref 30.0–36.0)
MCV: 96.5 fl (ref 78.0–100.0)
Monocytes Absolute: 1.1 10*3/uL — ABNORMAL HIGH (ref 0.1–1.0)
Monocytes Relative: 15.4 % — ABNORMAL HIGH (ref 3.0–12.0)
Neutro Abs: 4 10*3/uL (ref 1.4–7.7)
Neutrophils Relative %: 57.8 % (ref 43.0–77.0)
Platelets: 233 10*3/uL (ref 150.0–400.0)
RBC: 3.81 Mil/uL — ABNORMAL LOW (ref 4.22–5.81)
RDW: 15.1 % (ref 11.5–15.5)
WBC: 6.9 10*3/uL (ref 4.0–10.5)

## 2019-07-28 LAB — BASIC METABOLIC PANEL
BUN: 11 mg/dL (ref 6–23)
CO2: 28 mEq/L (ref 19–32)
Calcium: 9.3 mg/dL (ref 8.4–10.5)
Chloride: 100 mEq/L (ref 96–112)
Creatinine, Ser: 0.73 mg/dL (ref 0.40–1.50)
GFR: 134.03 mL/min (ref >60.00)
Glucose, Bld: 94 mg/dL (ref 70–99)
Potassium: 4 mEq/L (ref 3.5–5.1)
Sodium: 135 mEq/L (ref 135–145)

## 2019-07-28 LAB — HEPATIC FUNCTION PANEL
ALT: 19 U/L (ref 0–53)
AST: 18 U/L (ref 0–37)
Albumin: 4.3 g/dL (ref 3.5–5.2)
Alkaline Phosphatase: 66 U/L (ref 39–117)
Bilirubin, Direct: 0 mg/dL (ref 0.0–0.3)
Total Bilirubin: 0.3 mg/dL (ref 0.2–1.2)
Total Protein: 7.2 g/dL (ref 6.0–8.3)

## 2019-07-28 LAB — MAGNESIUM: Magnesium: 1.7 mg/dL (ref 1.5–2.5)

## 2019-07-28 NOTE — Assessment & Plan Note (Signed)
stable overall by history and exam, recent data reviewed with pt, and pt to continue medical treatment as before,  to f/u any worsening symptoms or concerns  

## 2019-07-28 NOTE — Assessment & Plan Note (Signed)
C/w prob ganglion cyst, ok to follow as pt declines sport med or podiatry referral

## 2019-07-28 NOTE — Assessment & Plan Note (Signed)
Pt states will continue to abstain

## 2019-07-28 NOTE — Assessment & Plan Note (Signed)
For f/u lab 

## 2019-07-28 NOTE — Assessment & Plan Note (Signed)
[place] for neurology referral and labs today

## 2019-07-28 NOTE — Progress Notes (Signed)
Subjective:    Patient ID: [name], male    DOB: 06/25/1961, 58 y.o.   MRN: [id number]  HPI  Here to f/u recent hospn 8/23 - 8-25, 58 y.o.malewithhistory ofalcohol abuse, seizure disorder with recent hospitalization,diastolic CHF, osteoarthritis with frequent falls, medication noncompliance presenting from home after sustaining what appears to be a seizure versus syncope. Admitted for seizure and possible syncope in setting of alcohol intoxication.   Neurology recommended stopping Keppra and starting Vimpat which was not started.  However, patient remained seizure-free with PRN Ativan.  Patient was later started on Vimpat later in the hospital course.  However, his co-pay was high at $200 for 1 month supply.  A  neurologist, Dr. Cheral Marker recommended restarting his Keppra at 500 mg twice daily.  Per neurology, not a good candidate for Depakote and Tegretol due to alcoholic hepatitis.  Patient was also evaluated by PT/OT prior to discharge and no need was identified. Today, pt not taking Vimpat due to cost.  Was taken off keppra in the past due to somnolence, but pt restarted on his own as he already has 3 mo supply.  No further siezure since in the hospital.  Today c/o right ankle medial side with swelling pain and tenderness, walking more lately.  Has happened prior several times and resolved.   Pt states has quit ETOH since hospn.   Past Medical History:  Diagnosis Date  . Alcohol abuse    in the past  . Anxiety   . Cardiomyopathy ([place])    hx of   Past Surgical History:  Procedure Laterality Date  . APPENDECTOMY    . HERNIA REPAIR    . TOTAL HIP ARTHROPLASTY Left 10/15/2013   Procedure: LEFT TOTAL HIP ARTHROPLASTY;  Surgeon: Yvette Rack., MD;  Location: [facility];  Service: Orthopedics;  Laterality: Left;  . TOTAL HIP ARTHROPLASTY Right 11/12/2017   Procedure: RIGHT TOTAL HIP ARTHROPLASTY ANTERIOR APPROACH;  Surgeon: Leandrew Koyanagi, MD;  Location: [facility];  Service: Orthopedics;   Laterality: Right;    reports that he has been smoking cigarettes. He has a 1.00 pack-year smoking history. He has never used smokeless tobacco. He reports current alcohol use. He reports that he does not use drugs. family history includes Colon cancer in his brother; Diabetes in his paternal uncle; Heart failure in his brother; Liver cancer in his brother. No Known Allergies Current Outpatient Medications on File Prior to Visit  Medication Sig Dispense Refill  . acetaminophen (TYLENOL) 500 MG tablet Take 1 tablet (500 mg total) by mouth every 6 (six) hours as needed for mild pain. 30 tablet 0  . citalopram (CELEXA) 20 MG tablet Take 0.5 tablets (10 mg total) by mouth at bedtime. 45 tablet 3  . lactulose (CHRONULAC) 10 GM/15ML solution Take 45 mLs (30 g total) by mouth 2 (two) times daily. 236 mL 0  . levETIRAcetam (KEPPRA) 500 MG tablet Take 500 mg by mouth 2 (two) times daily.    . Multiple Vitamin (MULTIVITAMIN WITH MINERALS) TABS tablet Take one tablet daily 90 tablet 1  . traZODone (DESYREL) 50 MG tablet Take 0.5-1 tablets (25-50 mg total) by mouth at bedtime as needed for sleep. (Patient taking differently: Take 50 mg by mouth at bedtime as needed for sleep. ) 90 tablet 1   No current facility-administered medications on file prior to visit.    Review of Systems  Constitutional: Negative for other unusual diaphoresis or sweats HENT: Negative for ear discharge or swelling  Eyes: Negative for other worsening visual disturbances Respiratory: Negative for stridor or other swelling  Gastrointestinal: Negative for worsening distension or other blood Genitourinary: Negative for retention or other urinary change Musculoskeletal: Negative for other MSK pain or swelling Skin: Negative for color change or other new lesions Neurological: Negative for worsening tremors and other numbness  Psychiatric/Behavioral: Negative for worsening agitation or other fatigue All other system neg per pt     Objective:   Physical Exam BP 128/86   Pulse (!) 104   Temp 98.4 F (36.9 C) (Oral)   Ht 5\' 8"  (1.727 m)   Wt 198 lb (89.8 kg)   SpO2 98%   BMI 30.11 kg/m  VS noted,  Constitutional: Pt appears in NAD HENT: Head: NCAT.  Right Ear: External ear normal.  Left Ear: External ear normal.  Eyes: . Pupils are equal, round, and reactive to light. Conjunctivae and EOM are normal Nose: without d/c or deformity Neck: Neck supple. Gross normal ROM Cardiovascular: Normal rate and regular rhythm.   Pulmonary/Chest: Effort normal and breath sounds without rales or wheezing.  Abd:  Soft, NT, ND, + BS, no organomegaly Neurological: Pt is alert. At baseline orientation, motor grossly intact Skin: Skin is warm. No rashes, other new lesions, no LE edema, right medial foot just distal to the medial meallolus with a localized area somewhat firm area 1 cm tender, swelling, tender Psychiatric: Pt behavior is normal without agitation  No other exam findings Lab Results  Component Value Date   WBC 4.8 07/20/2019   HGB 11.9 (L) 07/20/2019   HCT 34.9 (L) 07/20/2019   PLT 94 (L) 07/20/2019   GLUCOSE 94 07/20/2019   CHOL 213 (H) 04/27/2019   TRIG 135.0 04/27/2019   HDL 78.30 04/27/2019   LDLDIRECT 109.8 07/22/2014   LDLCALC 108 (H) 04/27/2019   ALT 58 (H) 07/20/2019   AST 124 (H) 07/20/2019   NA 134 (L) 07/20/2019   K 3.1 (L) 07/20/2019   CL 103 07/20/2019   CREATININE 0.74 07/20/2019   BUN 7 07/20/2019   CO2 21 (L) 07/20/2019   TSH 1.18 04/27/2019   PSA 4.36 (H) 04/27/2019   INR 1.1 06/27/2019   HGBA1C 5.2 04/27/2019       Assessment & Plan:

## 2019-07-28 NOTE — Patient Instructions (Addendum)
.  Please continue all other medications as before, and refills have been done if requested.  Please have the pharmacy call with any other refills you may need.  Please continue your efforts at being more active, low cholesterol diet, and weight control.  You are otherwise up to date with prevention measures today.  Please keep your appointments with your specialists as you may have planned  You will be contacted regarding the referral for: neurology  Please go to the LAB in the Basement (turn left off the elevator) for the tests to be done today  You will be contacted by phone if any changes need to be made immediately.  Otherwise, you will receive a letter about your results with an explanation, but please check with MyChart first.  Please remember to sign up for MyChart if you have not done so, as this will be important to you in the future with finding out test results, communicating by private email, and scheduling acute appointments online when needed.

## 2019-08-20 ENCOUNTER — Ambulatory Visit: Payer: Medicare HMO | Admitting: Internal Medicine

## 2019-08-20 DIAGNOSIS — Z0289 Encounter for other administrative examinations: Secondary | ICD-10-CM

## 2019-08-24 ENCOUNTER — Encounter: Payer: Self-pay | Admitting: Internal Medicine

## 2019-09-07 ENCOUNTER — Other Ambulatory Visit: Payer: Self-pay

## 2019-09-07 DIAGNOSIS — Z20822 Contact with and (suspected) exposure to covid-19: Secondary | ICD-10-CM

## 2019-09-07 DIAGNOSIS — Z20828 Contact with and (suspected) exposure to other viral communicable diseases: Secondary | ICD-10-CM | POA: Diagnosis not present

## 2019-09-09 ENCOUNTER — Telehealth: Payer: Self-pay | Admitting: General Practice

## 2019-09-09 LAB — NOVEL CORONAVIRUS, NAA: SARS-CoV-2, NAA: NOT DETECTED

## 2019-09-09 NOTE — Telephone Encounter (Signed)
Negative COVID results given. Patient results "NOT Detected." Caller expressed understanding. ° °

## 2019-09-15 ENCOUNTER — Encounter: Payer: Self-pay | Admitting: Internal Medicine

## 2019-09-29 IMAGING — CT CT HEAD WITHOUT CONTRAST
4 series · 15 of 47 positions shown, 17 images · non-contrast
Comparison: 11/13/2017

CLINICAL DATA: Seizure.  Currently postictal

EXAM:
CT HEAD WITHOUT CONTRAST
TECHNIQUE: Contiguous axial images were obtained from the base of the skull
through the vertex without intravenous contrast.

[Series 3: head without · axial · non-contrast · 0.48mm/px · z∈[-85,+50]mm · 7 of 37 slices shown, 9 images]
[im 5/37  brain]
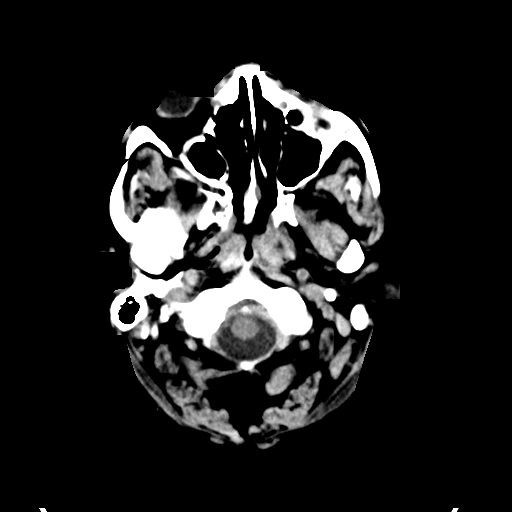
[im 5/37  bone]
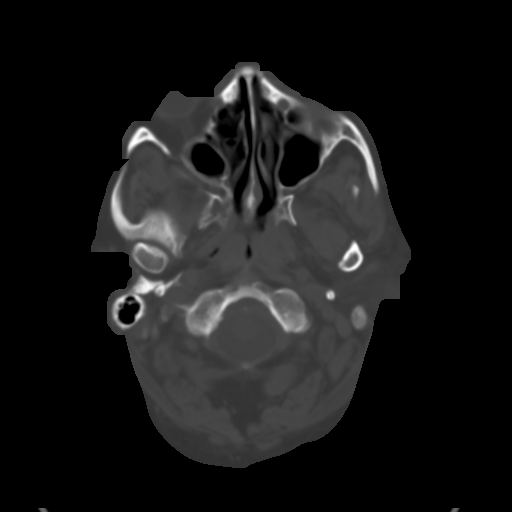
[im 10/37  brain]
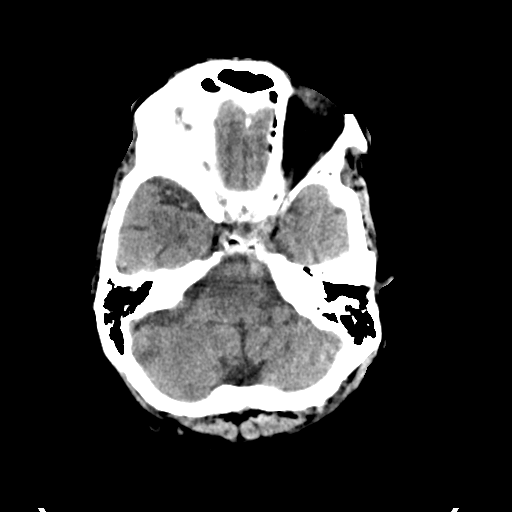
[im 14/37  brain]
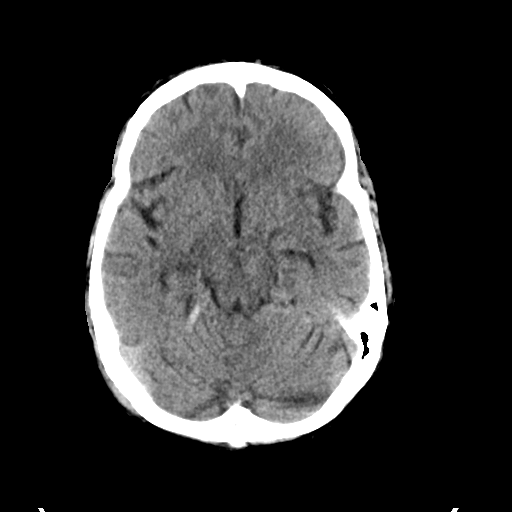
[im 19/37  brain]
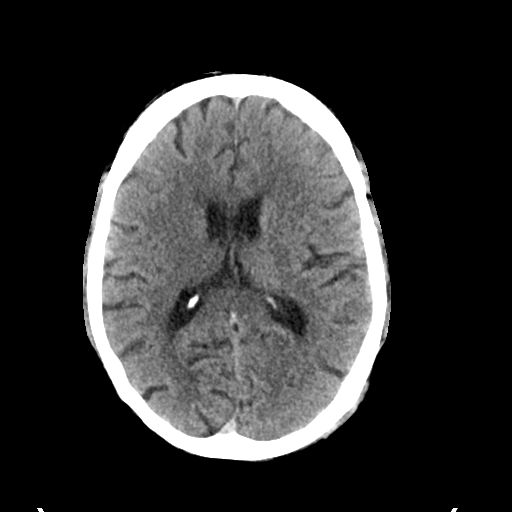
[im 23/37  brain]
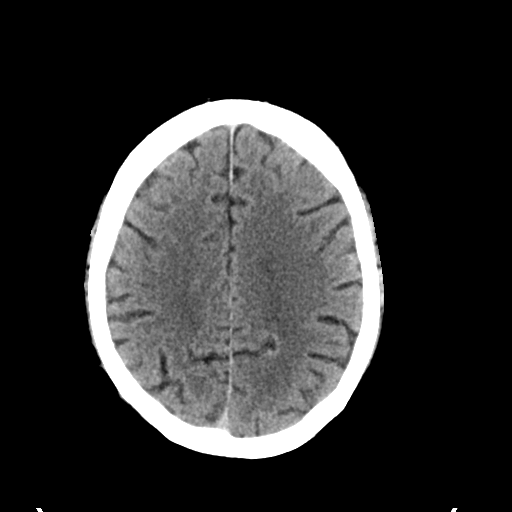
[im 23/37  bone]
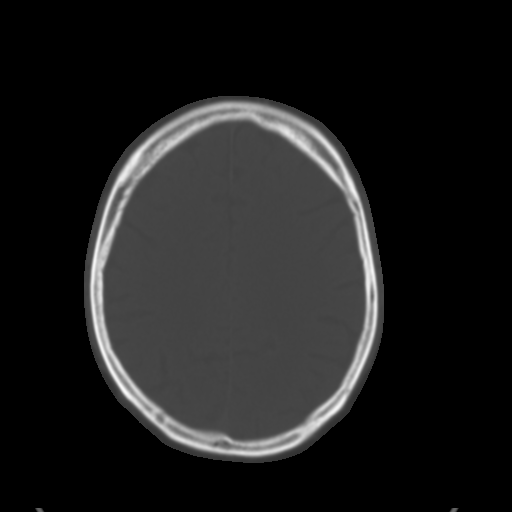
[im 28/37  brain]
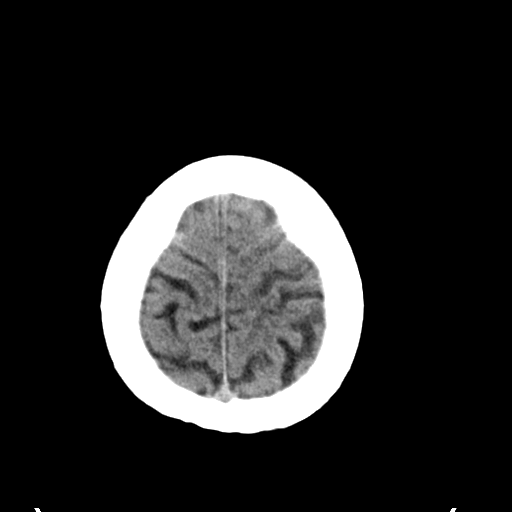
[im 32/37  brain]
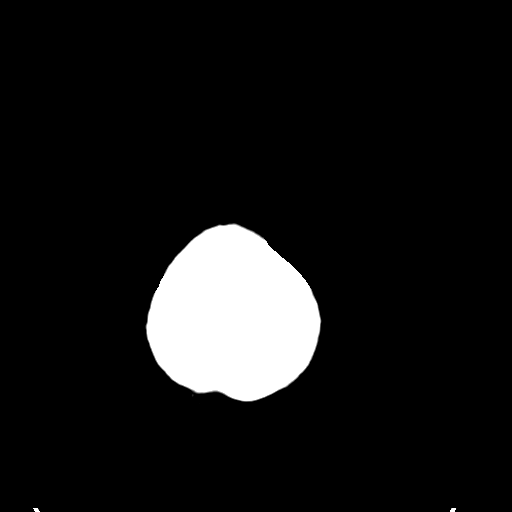

[Series 4: head bone · axial · 0.48mm/px · z∈[-87,-69]mm · 2 of 92 slices shown]
[im 10/92  bone]
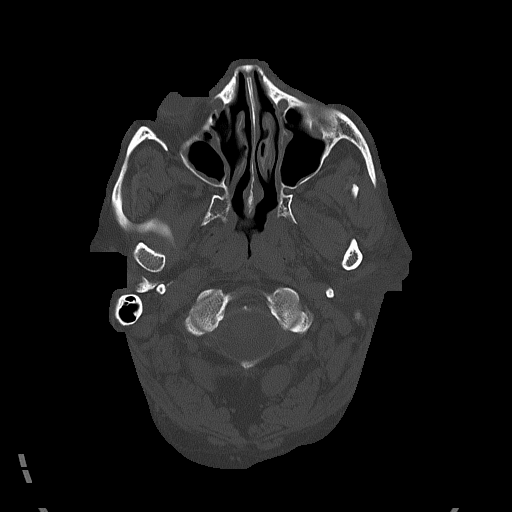
[im 19/92  bone]
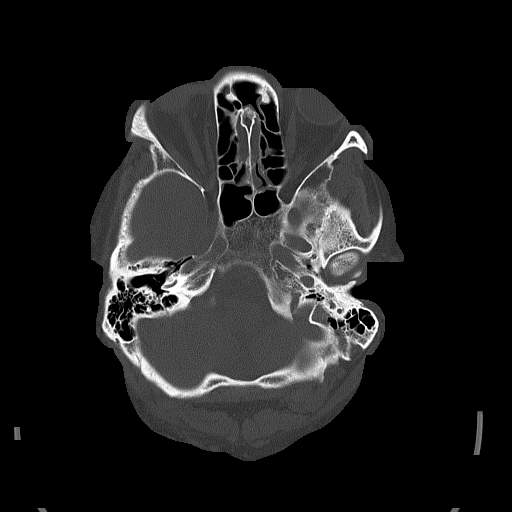

[Series 5: head without cor · coronal · non-contrast · 0.36mm/px · 3 of 68 slices shown]
[im 23/68  brain]
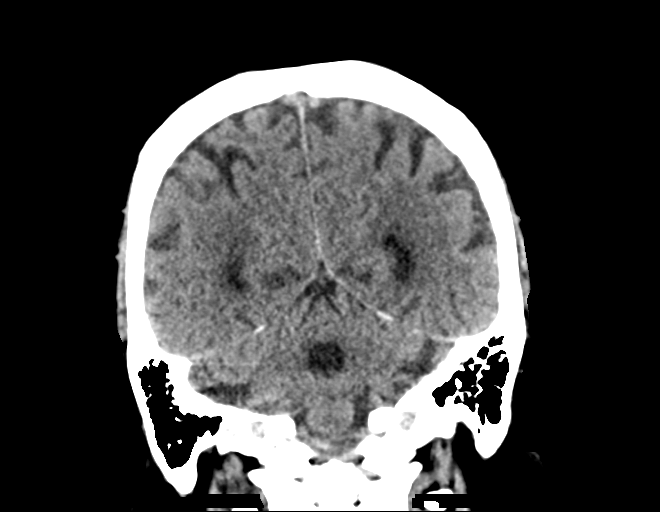
[im 30/68  brain]
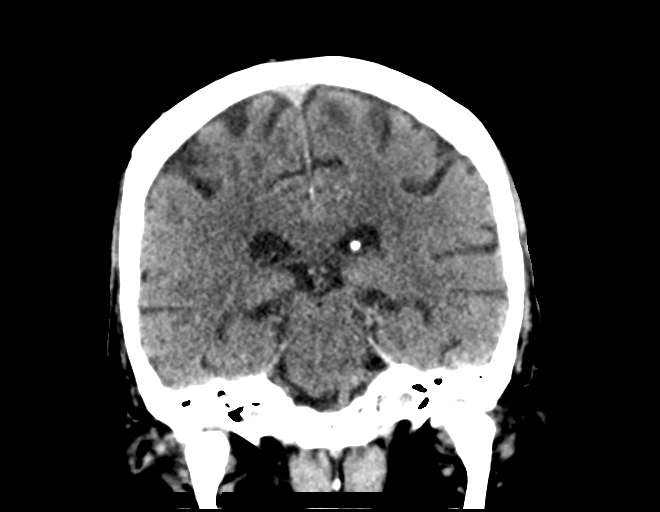
[im 38/68  brain]
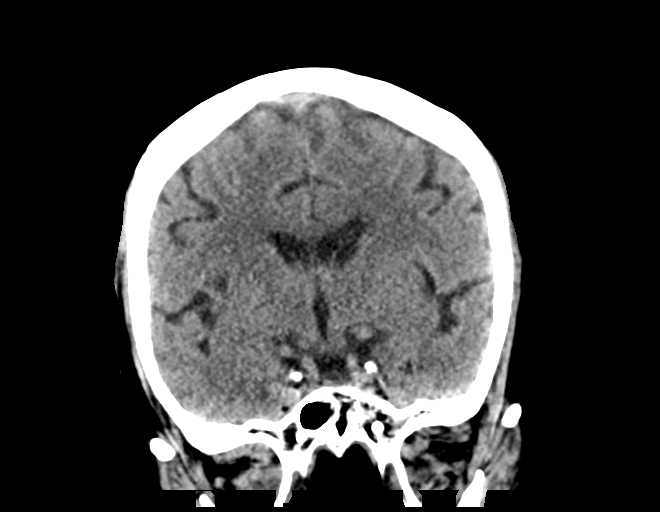

[Series 6: head without sag · sagittal · non-contrast · 0.36mm/px · 3 of 66 slices shown]
[im 22/66  brain]
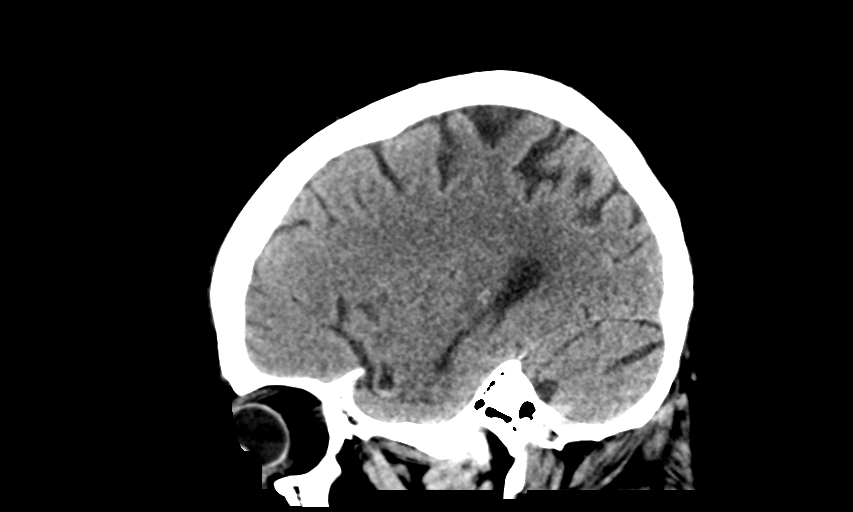
[im 33/66  brain]
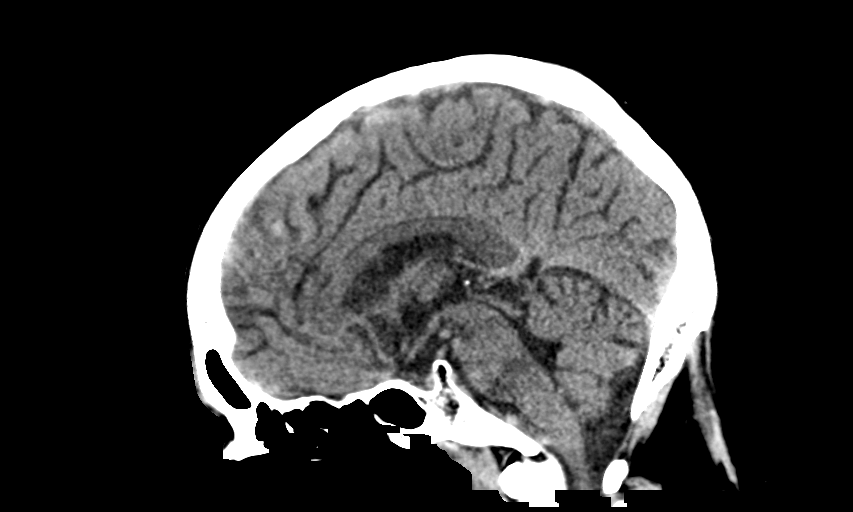
[im 44/66  brain]
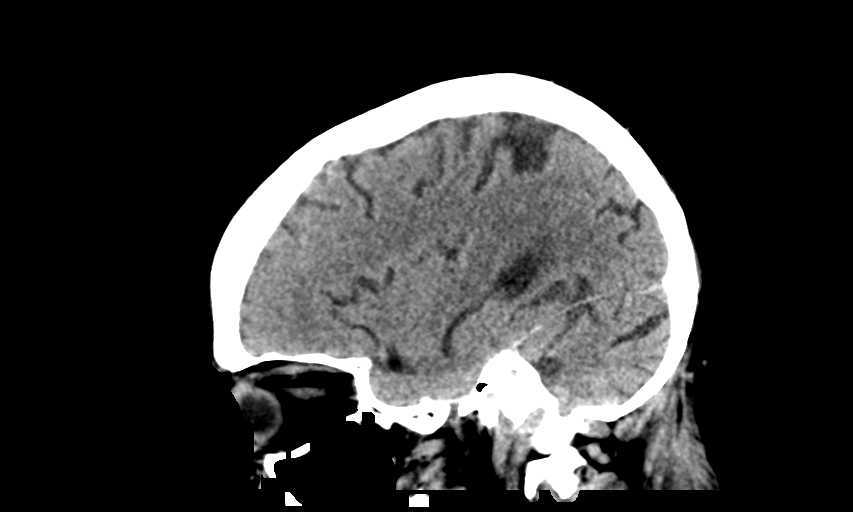

[15 of 47 positions shown; findings below may reference images not displayed]

FINDINGS: Brain: No evidence of acute infarction, hemorrhage, hydrocephalus,
extra-axial collection or mass lesion/mass effect.

Vascular: No hyperdense vessel or unexpected calcification.

Skull: Normal. Negative for fracture or focal lesion.

Sinuses/Orbits: Bilateral maxillary sinus mucosal thickening.

Other: None.
IMPRESSION: 1. No acute intracranial abnormality.  Normal brain.

## 2019-11-22 ENCOUNTER — Ambulatory Visit: Payer: Medicare HMO | Admitting: Neurology

## 2021-06-12 ENCOUNTER — Ambulatory Visit: Payer: Medicare HMO | Admitting: Internal Medicine

## 2021-10-05 ENCOUNTER — Other Ambulatory Visit: Payer: Self-pay

## 2021-10-05 ENCOUNTER — Encounter: Payer: Self-pay | Admitting: Emergency Medicine

## 2021-10-05 ENCOUNTER — Ambulatory Visit
Admission: EM | Admit: 2021-10-05 | Discharge: 2021-10-05 | Disposition: A | Discharge status: Home / Self Care | Payer: Medicare HMO | Attending: Internal Medicine | Admitting: Internal Medicine

## 2021-10-05 DIAGNOSIS — M542 Cervicalgia: Secondary | ICD-10-CM | POA: Diagnosis not present

## 2021-10-05 MED ORDER — IBUPROFEN 600 MG PO TABS: 600.0000 mg | ORAL_TABLET | Freq: Four times a day (QID) | ORAL | 0 refills | Status: AC | PRN

## 2021-10-05 MED ORDER — METHOCARBAMOL 500 MG PO TABS: 500.0000 mg | ORAL_TABLET | Freq: Every evening | ORAL | 0 refills | Status: AC | PRN

## 2021-10-05 MED ORDER — TRAZODONE HCL 50 MG PO TABS: 50.0000 mg | ORAL_TABLET | Freq: Every evening | ORAL | 0 refills | Status: AC | PRN

## 2021-10-05 NOTE — Discharge Instructions (Addendum)
This take medications as prescribed Gentle range of motion exercises Heating pad use Return to urgent care if symptom worsens. Please do not drive or operate heavy machines after taking muscle relaxant.

## 2021-10-05 NOTE — ED Triage Notes (Signed)
Woke up Wednesday morning with neck pain after strenuous activity the night before. Denies injury to area or previous hx of injury or surgery to area. Now is unable to turn head left or right. Movement worsens, rest eases pain. Did not taken any medication before coming today. HR is 120 in triage, patient denies CP and SOB, states he is nervous. Reports mild numbness/tingling in tips of right pinky and ring fingers only.

## 2021-10-05 NOTE — ED Provider Notes (Addendum)
[facility]    CSN: [id number] Arrival date & time: 10/05/21  1046      History   Chief Complaint Chief Complaint  Patient presents with   Neck Injury    HPI [name] is a 60 y.o. male comes to the urgent care with a 2-day history of neck pain.  Patient describes the pain as sharp, currently 7 out of 10 and aggravated by movement.  Pain is relieved by sitting still.  Is associated with neck stiffness.  No nausea or vomiting.  No numbness or tingling or weakness in the upper extremities.  No trauma to the neck or falls.  Patient recently changed his pillow.  He bought a new pillow a couple of days before onset of neck pain.  Patient has a history of insomnia.  Patient recently lost her wife and mother.  He has been having trouble sleeping since then. 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[facility]) 06/27/2019   Alcohol withdrawal syndrome with complication ([facility])    Lactic acidosis    Insomnia 05/23/2019   Low back pain 04/27/2019   Recurrent falls 04/27/2019   Grief 04/27/2019   Hyperglycemia 04/27/2019   B12 deficiency 04/27/2019   Vitamin D deficiency 04/27/2019   Right hip pain 12/04/2017   History of hip replacement 11/12/2017   Hip joint replacement status    Preop exam for internal medicine 10/11/2017   Pyuria 10/08/2017   Primary osteoarthritis of right hip 09/12/2017   Hypersomnolence 02/17/2015   Syncope 02/16/2015   Arthritis of right hip 09/05/2014   Essential hypertension, benign 07/24/2014   Encounter for well adult exam with abnormal findings 07/22/2014   Anxiety and  depression 07/22/2014   Alcohol abuse counseling and surveillance 06/25/2014   Left hip pain 05/07/2013    Past Surgical History:  Procedure Laterality Date   APPENDECTOMY     HERNIA REPAIR     TOTAL HIP ARTHROPLASTY Left 10/15/2013   Procedure: LEFT TOTAL HIP ARTHROPLASTY;  Surgeon: Thera Flake., MD;  Location: [facility];  Service: Orthopedics;  Laterality: Left;   TOTAL HIP ARTHROPLASTY Right 11/12/2017   Procedure: RIGHT TOTAL HIP ARTHROPLASTY ANTERIOR APPROACH;  Surgeon: Tarry Kos, MD;  Location: [facility];  Service: Orthopedics;  Laterality: Right;       Home Medications    Prior to Admission medications   Medication Sig Start Date End Date Taking? Authorizing Provider  ibuprofen (ADVIL) 600 MG tablet Take 1 tablet (600 mg total) by mouth every 6 (six) hours as needed. 10/05/21  Yes [name], Britta Mccreedy, MD  methocarbamol (ROBAXIN) 500 MG tablet Take 1 tablet (500 mg total) by mouth at bedtime as needed for muscle spasms. 10/05/21  Yes [name], Britta Mccreedy, MD  acetaminophen (TYLENOL) 500 MG tablet Take 1 tablet (500 mg total) by mouth every 6 (six) hours as needed for mild pain. 06/29/19   [name], MD  citalopram (CELEXA) 20 MG tablet Take 0.5 tablets (10 mg total) by mouth at bedtime. 05/20/19   [name],  MD  lactulose (CHRONULAC) 10 GM/15ML solution Take 45 mLs (30 g total) by mouth 2 (two) times daily. 06/29/19   [name], MD  levETIRAcetam (KEPPRA) 500 MG tablet Take 500 mg by mouth 2 (two) times daily.    [provider]  Multiple Vitamin (MULTIVITAMIN WITH MINERALS) TABS tablet Take one tablet daily 07/20/19   [name], MD  traZODone (DESYREL) 50 MG tablet Take 1 tablet (50 mg total) by mouth at bedtime as needed for sleep. 10/05/21   [name], Myrene Galas, MD    Family History Family History  Problem Relation Age of Onset   Colon cancer Brother    Heart failure Brother    Liver cancer Brother    Diabetes Paternal Uncle    Stroke Neg Hx    Learning  disabilities Neg Hx    Hypertension Neg Hx    Heart disease Neg Hx     Social History Social History   Tobacco Use   Smoking status: Every Day    Packs/day: 0.25    Years: 4.00    Pack years: 1.00    Types: Cigarettes   Smokeless tobacco: Never   Tobacco comments:    4-5 a day  Vaping Use   Vaping Use: Never used  Substance Use Topics   Alcohol use: Yes    Comment: 2-3 shots a day   Drug use: No     Allergies   Patient has no known allergies.   Review of Systems Review of Systems  Constitutional: Negative.   HENT: Negative.    Gastrointestinal: Negative.   Musculoskeletal:  Positive for neck pain and neck stiffness.    Physical Exam Triage Vital Signs ED Triage Vitals [10/05/21 1144]  Enc Vitals Group     BP 133/88     Pulse Rate (!) 120     Resp 16     Temp 98.7 F (37.1 C)     Temp Source Oral     SpO2 96 %     Weight      Height      Head Circumference      Peak Flow      Pain Score 9     Pain Loc      Pain Edu?      Excl. in [place]?    No data found.  Updated Vital Signs BP 133/88 (BP Location: Right Arm)   Pulse (!) 120   Temp 98.7 F (37.1 C) (Oral)   Resp 16   SpO2 96%   Visual Acuity Right Eye Distance:   Left Eye Distance:   Bilateral Distance:    Right Eye Near:   Left Eye Near:    Bilateral Near:     Physical Exam Vitals and nursing note reviewed.  Constitutional:      General: He is not in acute distress.    Appearance: He is not ill-appearing.  Cardiovascular:     Rate and Rhythm: Normal rate and regular rhythm.  Musculoskeletal:     Comments: Flexion is limited as well as extension of the cervical spine.  Patient has diminished rotation of the cervical spine.  Neurological:     Mental Status: He is alert.     UC Treatments / Results  Labs (all labs ordered are listed, but only abnormal results are displayed) Labs Reviewed - No data to display  EKG   Radiology No results found.  Procedures Procedures  (including critical care time)  Medications Ordered in  UC Medications - No data to display  Initial Impression / Assessment and Plan / UC Course  I have reviewed the triage vital signs and the nursing notes.  Pertinent labs & imaging results that were available during my care of the patient were reviewed by me and considered in my medical decision making (see chart for details).     1.  Acute neck pain with muscle strain: Robaxin as needed for neck spasms Ibuprofen 600 mg every 6 hours as needed for pain Trazodone at bedtime Return to urgent care if symptoms worsen. Final Clinical Impressions(s) / UC Diagnoses   Final diagnoses:  Acute neck pain     Discharge Instructions      This take medications as prescribed Gentle range of motion exercises Heating pad use Return to urgent care if symptom worsens. Please do not drive or operate heavy machines after taking muscle relaxant.   ED Prescriptions     Medication Sig Dispense Auth. Provider   traZODone (DESYREL) 50 MG tablet Take 1 tablet (50 mg total) by mouth at bedtime as needed for sleep. 30 tablet Pamela Maddy, Britta Mccreedy, MD   methocarbamol (ROBAXIN) 500 MG tablet Take 1 tablet (500 mg total) by mouth at bedtime as needed for muscle spasms. 20 tablet Meaghan Whistler, Britta Mccreedy, MD   ibuprofen (ADVIL) 600 MG tablet Take 1 tablet (600 mg total) by mouth every 6 (six) hours as needed. 30 tablet Tykira Wachs, Britta Mccreedy, MD      PDMP not reviewed this encounter.   Merrilee Jansky, MD 10/05/21 1326    Merrilee Jansky, MD 10/05/21 1328

## 2022-07-04 ENCOUNTER — Ambulatory Visit: Payer: Self-pay | Admitting: Licensed Clinical Social Worker

## 2022-07-04 NOTE — Patient Outreach (Signed)
  Care Coordination   07/04/2022 Name: [name] MRN: [id number] DOB: 12/02/1960   Care Coordination Outreach Attempts:  Contact was made with the patient today to offer care coordination services as a benefit of their health plan. The patient requested a return call on a later date.   Follow Up Plan:   Care Coordination phone appointment scheduled 07/09/2022  AWV scheduled 07/10/2022  Encounter Outcome:  Pt. Scheduled  Care Coordination Interventions Activated:  Yes   Care Coordination Interventions:  Yes, provided    Sammuel Hines, [facility] Care Coordination  [facility] [phone]

## 2022-07-04 NOTE — Patient Instructions (Signed)
     It was a pleasure speaking with you today. per your request your appointment is scheduled Aug 15th   Sammuel Hines, [facility] Care Coordination  [facility] [employer] [facility] [phone]

## 2022-07-09 ENCOUNTER — Ambulatory Visit: Payer: Self-pay | Admitting: Licensed Clinical Social Worker

## 2022-07-09 DIAGNOSIS — Z139 Encounter for screening, unspecified: Secondary | ICD-10-CM

## 2022-07-09 NOTE — Patient Instructions (Signed)
Visit Information  Thank you for taking time to visit with me today. Please don't hesitate to contact me if I can be of assistance to you.   Following are the goals we discussed today:   Goals Addressed             This Visit's Progress    Start counseling       Care Coordination Interventions: PHQ2/PHQ9 completed Solution-Focused Strategies employed:  Active listening / Reflection utilized  Provided psychoeducation for mental health needs  Discussed options for therapy based on need and insurance  Made referral to [facility] [phone] phone # Referral faxed 07/09/2022 via EPIC       Our next appointment is by telephone on Aug. 29th at 9:00  Please call the care guide team at [phone] if you need to cancel or reschedule your appointment.   If you are experiencing a Mental Health or Behavioral Health Crisis or need someone to talk to, please call the Suicide and Crisis Lifeline: 988 call the [place] National Suicide Prevention Lifeline: 4171428191 or TTY: 5341783711 TTY (970)366-4010) to talk to a trained counselor call 1-800-273-TALK (toll free, 24 hour hotline)   The patient verbalized understanding of instructions, educational materials, and care plan provided today and DECLINED offer to receive copy of patient instructions, educational materials, and care plan.   Sammuel Hines, LCSW Care Coordination  [facility] [phone]

## 2022-07-09 NOTE — Patient Outreach (Addendum)
  Care Coordination  Initial Visit Note   07/09/2022 Name: ASKARI KINLEY MRN: [id number] DOB: November 01, 1961  Elgie Collard Krinke is a 61 y.o. year old male who sees [name], MD for primary care. I spoke with  [name] by phone today  What matters to the patients health and wellness today?  Managing his mental health  Patient is experiencing symptoms of  complicated grief which seems to be exacerbated by trauma associated with the death of his wife..   Recommendation: Patient may benefit from, and is in agreement to To start counseling .     Goals Addressed             This Visit's Progress    Start counseling       Care Coordination Interventions: PHQ2/PHQ9 completed Solution-Focused Strategies employed:  Active listening / Reflection utilized  Provided psychoeducation for mental health needs  Discussed options for therapy based on need and insurance  Made referral to [facility] [phone] phone # Referral faxed 07/09/2022 via Epic Referral placed for Transportation Barriers        SDOH assessments and interventions completed:  Yes  SDOH Interventions Today    Flowsheet Row Most Recent Value  SDOH Interventions   Food Insecurity Interventions Intervention Not Indicated  Financial Strain Interventions Intervention Not Indicated  Housing Interventions Intervention Not Indicated  Transportation Interventions Payor Benefit  Depression Interventions/Treatment  Counseling       Care Coordination Interventions Activated:  Yes  Care Coordination Interventions:  Yes, provided   Follow up plan: Follow up call scheduled for Aug. 29th at 9:00    Encounter Outcome:  Pt. Visit Completed   Sammuel Hines, [facility] Care Coordination  [facility] [phone]

## 2022-07-09 NOTE — Addendum Note (Signed)
Addended by: Sammuel Hines H on: 07/09/2022 10:57 AM   Modules accepted: Orders

## 2022-07-10 ENCOUNTER — Telehealth: Payer: Self-pay

## 2022-07-10 ENCOUNTER — Ambulatory Visit (INDEPENDENT_AMBULATORY_CARE_PROVIDER_SITE_OTHER): Payer: Medicare HMO

## 2022-07-10 DIAGNOSIS — Z0000 Encounter for general adult medical examination without abnormal findings: Secondary | ICD-10-CM | POA: Diagnosis not present

## 2022-07-10 NOTE — Progress Notes (Addendum)
Subjective:   [name] is a 61 y.o. male who presents for an Initial Medicare Annual Wellness Visit.  I connected with  [name] on 07/10/22 by a audio enabled telemedicine application and verified that I am speaking with the correct person using two identifiers.  Patient Location: Home  Provider Location: Office/Clinic  I discussed the limitations of evaluation and management by telemedicine. The patient expressed understanding and agreed to proceed.   Review of Systems: Defer to PCP       Objective:    Today's Vitals   07/10/22 1402  PainSc: 5    There is no height or weight on file to calculate BMI.     07/10/2022    2:06 PM 07/18/2019    3:00 PM 06/27/2019    4:04 PM 11/27/2018    7:14 PM 11/12/2017    5:00 PM 11/05/2017   10:15 AM 09/19/2017   11:01 AM  Advanced Directives  Does Patient Have a Medical Advance Directive? No No No No Yes Yes No  Type of Advance Directive     Healthcare Power of [employer] Power of Attorney   Does patient want to make changes to medical advance directive?     No - Patient declined    Copy of Healthcare Power of Attorney in Chart?      No - copy requested   Would patient like information on creating a medical advance directive? No - Patient declined No - Patient declined No - Patient declined No - Patient declined       Current Medications (verified) Outpatient Encounter Medications as of 07/10/2022  Medication Sig   acetaminophen (TYLENOL) 500 MG tablet Take 1 tablet (500 mg total) by mouth every 6 (six) hours as needed for mild pain.   ibuprofen (ADVIL) 600 MG tablet Take 1 tablet (600 mg total) by mouth every 6 (six) hours as needed.   traZODone (DESYREL) 50 MG tablet Take 1 tablet (50 mg total) by mouth at bedtime as needed for sleep.   citalopram (CELEXA) 20 MG tablet Take 0.5 tablets (10 mg total) by mouth at bedtime. (Patient not taking: Reported on 07/10/2022)   levETIRAcetam (KEPPRA) 500 MG tablet Take 500  mg by mouth 2 (two) times daily. (Patient not taking: Reported on 07/10/2022)   methocarbamol (ROBAXIN) 500 MG tablet Take 1 tablet (500 mg total) by mouth at bedtime as needed for muscle spasms. (Patient not taking: Reported on 07/10/2022)   Multiple Vitamin (MULTIVITAMIN WITH MINERALS) TABS tablet Take one tablet daily (Patient not taking: Reported on 07/10/2022)   [DISCONTINUED] lactulose (CHRONULAC) 10 GM/15ML solution Take 45 mLs (30 g total) by mouth 2 (two) times daily. (Patient not taking: Reported on 07/10/2022)   No facility-administered encounter medications on file as of 07/10/2022.    Allergies (verified) Patient has no known allergies.   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   Procedure: LEFT TOTAL HIP ARTHROPLASTY;  Surgeon: Thera Flake., MD;  Location: [facility] OR;  Service: Orthopedics;  Laterality: Left;   TOTAL HIP ARTHROPLASTY Right 11/12/2017   Procedure: RIGHT TOTAL HIP ARTHROPLASTY ANTERIOR APPROACH;  Surgeon: Tarry Kos, MD;  Location: [facility] OR;  Service:  Orthopedics;  Laterality: Right;   Family History  Problem Relation Age of Onset   Colon cancer Brother    Heart failure Brother    Liver cancer Brother    Diabetes Paternal Uncle    Stroke Neg Hx    Learning disabilities Neg Hx    Hypertension Neg Hx    Heart disease Neg Hx    Social History   Socioeconomic History   Marital status: Widowed    Spouse name: [name]   Number of children: 0   Years of education: 12   Highest education level: Not on file  Occupational History   Occupation: Copywriter, advertising: [employer]  Tobacco Use   Smoking status: Every Day    Packs/day: 0.25    Years: 4.00    Total pack years: 1.00    Types: Cigarettes   Smokeless tobacco: Never   Tobacco comments:     4-5 a day  Vaping Use   Vaping Use: Never used  Substance and Sexual Activity   Alcohol use: Yes    Comment: 2-3 shots a day   Drug use: No   Sexual activity: Yes    Birth control/protection: None  Other Topics Concern   Not on file  Social History Narrative   Patient has a 10th grade education.    Social Determinants of Health   Financial Resource Strain: Medium Risk (07/09/2022)   Overall Financial Resource Strain (CARDIA)    Difficulty of Paying Living Expenses: Somewhat hard  Food Insecurity: Food Insecurity Present (07/09/2022)   Hunger Vital Sign    Worried About Running Out of Food in the Last Year: Sometimes true    Ran Out of Food in the Last Year: Never true  Transportation Needs: Unmet Transportation Needs (07/09/2022)   PRAPARE - Administrator, Civil Service (Medical): Yes    Lack of Transportation (Non-Medical): No  Physical Activity: Not on file  Stress: Not on file  Social Connections: Not on file    Tobacco Counseling Ready to quit: No Counseling given: No Tobacco comments: 4-5 a day   Clinical Intake:  Pre-visit preparation completed: Yes  Pain : 0-10 Pain Score: 5  Pain Type: Chronic pain Pain Location: Foot Pain Onset: More than a month ago Pain Frequency: Constant     Nutritional Status: BMI > 30  Obese Nutritional Risks: None Diabetes: No  How often do you need to have someone help you when you read instructions, pamphlets, or other written materials from your doctor or pharmacy?: 1 - Never  Diabetic?No  Interpreter Needed?: No    Activities of Daily Living    07/10/2022    1:59 PM  In your present state of health, do you have any difficulty performing the following activities:  Hearing? 0  Vision? 0  Difficulty concentrating or making decisions? 0  Walking or climbing stairs? 1  Dressing or bathing? 0  Doing errands, shopping? 0    Patient Care Team: Shawn Levins, MD as PCP - General (Internal  Medicine)  Indicate any recent Medical Services you may have received from other than [facility] providers in the past year (date may be approximate).     Assessment:   This is a routine wellness examination for [name].  Hearing/Vision screen No results found.  Dietary issues and exercise activities discussed: Current Exercise Habits: The patient does not participate in regular exercise at present, Exercise limited by: neurologic condition(s);orthopedic condition(s)   Depression Screen    07/10/2022  1:58 PM 07/09/2022   10:08 AM 04/27/2019    9:21 AM 12/04/2017   10:24 PM 10/11/2017    6:25 PM 10/08/2017    2:32 PM 09/12/2017    3:21 PM  PHQ 2/9 Scores  PHQ - 2 Score 3 2 1 1  0 0 3  PHQ- 9 Score 13 9     10     Fall Risk    07/10/2022    1:58 PM 04/27/2019    9:21 AM 10/08/2017    2:24 PM 05/07/2013   10:37 AM  Fall Risk   Falls in the past year? 1 1 Yes No  Number falls in past yr: 1 1 1    Injury with Fall? 1 0 No   Risk for fall due to : History of fall(s);Impaired balance/gait;Impaired mobility     Follow up Falls evaluation completed;Falls prevention discussed       FALL RISK PREVENTION PERTAINING TO THE HOME:  Any stairs in or around the home? No  If so, are there any without handrails? No  Home free of loose throw rugs in walkways, pet beds, electrical cords, etc? Yes  Adequate lighting in your home to reduce risk of falls? Yes   ASSISTIVE DEVICES UTILIZED TO PREVENT FALLS:  Life alert? No  Use of a cane, walker or w/c? No  Grab bars in the bathroom? No  Shower chair or bench in shower? No  Elevated toilet seat or a handicapped toilet? No   TIMED UP AND GO:  Was the test performed? No .    Cognitive Function:       07/10/2022    1:57 PM  6CIT Screen  What Year? 0 points  What month? 0 points  What time? 0 points  Count back from 20 0 points  Months in reverse 0 points  Repeat phrase 0 points  Total Score 0 points    Immunizations Immunization  History  Administered Date(s) Administered   Influenza,inj,Quad PF,6+ Mos 10/17/2013    TDAP status: Due, Education has been provided regarding the importance of this vaccine. Advised may receive this vaccine at local pharmacy or Health Dept. Aware to provide a copy of the vaccination record if obtained from local pharmacy or Health Dept. Verbalized acceptance and understanding.  Flu Vaccine status: Due, Education has been provided regarding the importance of this vaccine. Advised may receive this vaccine at local pharmacy or Health Dept. Aware to provide a copy of the vaccination record if obtained from local pharmacy or Health Dept. Verbalized acceptance and understanding.  Pneumococcal vaccine status: Due, Education has been provided regarding the importance of this vaccine. Advised may receive this vaccine at local pharmacy or Health Dept. Aware to provide a copy of the vaccination record if obtained from local pharmacy or Health Dept. Verbalized acceptance and understanding.  Covid-19 vaccine status: Information provided on how to obtain vaccines.   Qualifies for Shingles Vaccine? No   Zostavax completed No   Shingrix Completed?: No.    Education has been provided regarding the importance of this vaccine. Patient has been advised to call insurance company to determine out of pocket expense if they have not yet received this vaccine. Advised may also receive vaccine at local pharmacy or Health Dept. Verbalized acceptance and understanding.  Screening Tests Health Maintenance  Topic Date Due   COVID-19 Vaccine (1) 07/26/2022 (Originally 02/18/1962)   Zoster Vaccines- Shingrix (1 of 2) 10/10/2022 (Originally 08/20/2012)   INFLUENZA VACCINE  02/23/2023 (Originally 06/25/2022)   COLONOSCOPY (Pts  45-84yrs Insurance coverage will need to be confirmed)  07/11/2023 (Originally 08/21/2007)   TETANUS/TDAP  07/11/2023 (Originally 08/20/1981)   Hepatitis C Screening  Completed   HIV Screening  Completed    HPV VACCINES  Aged Out    Health Maintenance  There are no preventive care reminders to display for this patient.  Colon Screening: Not completed  Lung Cancer Screening: (Low Dose CT Chest recommended if Age 15-80 years, 30 pack-year currently smoking OR have quit w/in 15years.) does qualify.   Lung Cancer Screening Referral: Refused  Additional Screening:  Hepatitis C Screening: does qualify; Completed Yes  Vision Screening: Recommended annual ophthalmology exams for early detection of glaucoma and other disorders of the eye. Is the patient up to date with their annual eye exam?  No  Who is the provider or what is the name of the office in which the patient attends annual eye exams? NA If pt is not established with a provider, would they like to be referred to a provider to establish care? Yes .   Dental Screening: Recommended annual dental exams for proper oral hygiene  Community Resource Referral / Chronic Care Management: CRR required this visit?  Yes   CCM required this visit?  Yes      Plan:     I have personally reviewed and noted the following in the patient's chart:   Medical and social history Use of alcohol, tobacco or illicit drugs  Current medications and supplements including opioid prescriptions. Patient is not currently taking opioid prescriptions. Functional ability and status Nutritional status Physical activity Advanced directives List of other physicians Hospitalizations, surgeries, and ER visits in previous 12 months Vitals Screenings to include cognitive, depression, and falls Referrals and appointments  In addition, I have reviewed and discussed with patient certain preventive protocols, quality metrics, and best practice recommendations. A written personalized care plan for preventive services as well as general preventive health recommendations were provided to patient.     Jeralene Peters, RN   07/10/2022   Nurse Notes: Non face to  face 30 minutes  Mr. Witt ,  Thank you for taking time to come for your Medicare Wellness Visit. I appreciate your ongoing commitment to your health goals. Please review the following plan we discussed and let me know if I can assist you in the future.     This is a list of the screening recommended for you and due dates:  Health Maintenance  Topic Date Due   COVID-19 Vaccine (1) 07/26/2022*   Zoster (Shingles) Vaccine (1 of 2) 10/10/2022*   Flu Shot  02/23/2023*   Colon Cancer Screening  07/11/2023*   Tetanus Vaccine  07/11/2023*   Hepatitis C Screening: USPSTF Recommendation to screen - Ages 18-79 yo.  Completed   HIV Screening  Completed   HPV Vaccine  Aged Out  *Topic was postponed. The date shown is not the original due date.      Medical screening examination/treatment/procedure(s) were performed by non-physician practitioner and as supervising physician I was immediately available for consultation/collaboration.  I agree with above. Oliver Barre, MD

## 2022-07-10 NOTE — Telephone Encounter (Signed)
   Telephone encounter was:  Successful.  07/10/2022 Name: [name] MRN: [id number] DOB: 1960/12/25  [name] is a 61 y.o. year old male who is a primary care patient of [name], MD . The community resource team was consulted for assistance with Transportation Needs   Care guide performed the following interventions: Spoke with patient, gave  contact information for [employer] [phone] to schedule and [phone] for pickup after appointment.  Informed patient he would need to call 3 business days prior to appointment. Patient has my  name and number if he does not receive email..  Follow Up Plan:  No further follow up planned at this time. The patient has been provided with needed resources.  Josselin Gaulin, AAS Paralegal, [facility] Care Guide  Embedded Care Coordination [place]  Care Management  [address] [place], [place] [address] [email or web address]  ?? [phone]   [email or web address]

## 2022-07-10 NOTE — Patient Instructions (Signed)
Health Maintenance, Male Adopting a healthy lifestyle and getting preventive care are important in promoting health and wellness. Ask your health care provider about: The right schedule for you to have regular tests and exams. Things you can do on your own to prevent diseases and keep yourself healthy. What should I know about diet, weight, and exercise? Eat a healthy diet  Eat a diet that includes plenty of vegetables, fruits, low-fat dairy products, and lean protein. Do not eat a lot of foods that are high in solid fats, added sugars, or sodium. Maintain a healthy weight Body mass index (BMI) is a measurement that can be used to identify possible weight problems. It estimates body fat based on height and weight. Your health care provider can help determine your BMI and help you achieve or maintain a healthy weight. Get regular exercise Get regular exercise. This is one of the most important things you can do for your health. Most adults should: Exercise for at least 150 minutes each week. The exercise should increase your heart rate and make you sweat (moderate-intensity exercise). Do strengthening exercises at least twice a week. This is in addition to the moderate-intensity exercise. Spend less time sitting. Even light physical activity can be beneficial. Watch cholesterol and blood lipids Have your blood tested for lipids and cholesterol at 60 years of age, then have this test every 5 years. You may need to have your cholesterol levels checked more often if: Your lipid or cholesterol levels are high. You are older than 60 years of age. You are at high risk for heart disease. What should I know about cancer screening? Many types of cancers can be detected early and may often be prevented. Depending on your health history and family history, you may need to have cancer screening at various ages. This may include screening for: Colorectal cancer. Prostate cancer. Skin cancer. Lung  cancer. What should I know about heart disease, diabetes, and high blood pressure? Blood pressure and heart disease High blood pressure causes heart disease and increases the risk of stroke. This is more likely to develop in people who have high blood pressure readings or are overweight. Talk with your health care provider about your target blood pressure readings. Have your blood pressure checked: Every 3-5 years if you are 18-39 years of age. Every year if you are 40 years old or older. If you are between the ages of 65 and 75 and are a current or former smoker, ask your health care provider if you should have a one-time screening for abdominal aortic aneurysm (AAA). Diabetes Have regular diabetes screenings. This checks your fasting blood sugar level. Have the screening done: Once every three years after age 45 if you are at a normal weight and have a low risk for diabetes. More often and at a younger age if you are overweight or have a high risk for diabetes. What should I know about preventing infection? Hepatitis B If you have a higher risk for hepatitis B, you should be screened for this virus. Talk with your health care provider to find out if you are at risk for hepatitis B infection. Hepatitis C Blood testing is recommended for: Everyone born from 1945 through 1965. Anyone with known risk factors for hepatitis C. Sexually transmitted infections (STIs) You should be screened each year for STIs, including gonorrhea and chlamydia, if: You are sexually active and are younger than 60 years of age. You are older than 60 years of age and your   health care provider tells you that you are at risk for this type of infection. Your sexual activity has changed since you were last screened, and you are at increased risk for chlamydia or gonorrhea. Ask your health care provider if you are at risk. Ask your health care provider about whether you are at high risk for HIV. Your health care provider  may recommend a prescription medicine to help prevent HIV infection. If you choose to take medicine to prevent HIV, you should first get tested for HIV. You should then be tested every 3 months for as long as you are taking the medicine. Follow these instructions at home: Alcohol use Do not drink alcohol if your health care provider tells you not to drink. If you drink alcohol: Limit how much you have to 0-2 drinks a day. Know how much alcohol is in your drink. In the [place] one drink equals one 12 oz bottle of beer (355 mL), one 5 oz glass of wine (148 mL), or one 1 oz glass of hard liquor (44 mL). Lifestyle Do not use any products that contain nicotine or tobacco. These products include cigarettes, chewing tobacco, and vaping devices, such as e-cigarettes. If you need help quitting, ask your health care provider. Do not use street drugs. Do not share needles. Ask your health care provider for help if you need support or information about quitting drugs. General instructions Schedule regular health, dental, and eye exams. Stay current with your vaccines. Tell your health care provider if: You often feel depressed. You have ever been abused or do not feel safe at home. Summary Adopting a healthy lifestyle and getting preventive care are important in promoting health and wellness. Follow your health care provider's instructions about healthy diet, exercising, and getting tested or screened for diseases. Follow your health care provider's instructions on monitoring your cholesterol and blood pressure. This information is not intended to replace advice given to you by your health care provider. Make sure you discuss any questions you have with your health care provider. Document Revised: 04/02/2021 Document Reviewed: 04/02/2021 Elsevier Patient Education  2023 Elsevier Inc.  

## 2022-07-23 ENCOUNTER — Ambulatory Visit: Payer: Self-pay | Admitting: Licensed Clinical Social Worker

## 2022-07-23 NOTE — Patient Instructions (Addendum)
Visit Information  Thank you for taking time to visit with me today. Please don't hesitate to contact me if I can be of assistance to you.   Following are the goals we discussed today:   Goals Addressed             This Visit's Progress    COMPLETED: Start counseling       Care Coordination Interventions: Solution-Focused Strategies employed: for virtual therapy appointment Active listening / Reflection utilized  Task Centered for things he needs to complete Discussed importance of making appointment with PCP Collaborated with [facility] for scheduling therapy appointment          Please call the care guide team at [phone] if you need to cancel or reschedule your appointment.   If you are experiencing a Mental Health or Behavioral Health Crisis or need someone to talk to, please call the [facility]: [phone]   The patient verbalized understanding of instructions, educational materials, and care plan provided today and DECLINED offer to receive copy of patient instructions, educational materials, and care plan.   No further follow up required: by care coordination you declined further follow up  [name], [facility] Care Coordination  [facility] [phone]

## 2022-07-23 NOTE — Patient Outreach (Signed)
  Care Coordination   Follow Up Visit Note   07/23/2022 Name: AADIL SUR MRN: [id number] DOB: November 30, 1960  Elgie Collard Towson is a 61 y.o. year old male who sees [name], MD for primary care. I spoke with  [name] by phone today.  What matters to the patients health and wellness today?  His mental health Patient is making he has initial therapy assessment 07/15/2022 Completed Medicare AWV.   Recommendation: Patient may benefit from, and is in agreement to Keep next therapy appointment Sept 8th with [facility] Call PCP office to schedule a physical .  Call insurance to schedule ride to appointment   Goals Addressed             This Visit's Progress    COMPLETED: Start counseling       Care Coordination Interventions: Solution-Focused Strategies employed: for virtual therapy appointment Active listening / Reflection utilized  Task Centered for things he needs to complete Discussed importance of making appointment with PCP Collaborated with [facility] for scheduling therapy appointment        SDOH assessments and interventions completed:  Yes  SDOH Interventions Today    Flowsheet Row Most Recent Value  SDOH Interventions   Transportation Interventions Other (Comment)  [reviewed resources with patient provided by careguide]  Depression Interventions/Treatment  Counseling       Care Coordination Interventions Activated:  Yes  Care Coordination Interventions:  Yes, provided   Follow up plan:  No further intervention required. Patient declined ongoing care coordination     Encounter Outcome:  Pt. Visit Completed   Sammuel Hines, [facility] Care Coordination  [facility] [phone]
